# Patient Record
Sex: Male | Born: 1940 | Race: White | Hispanic: No | Marital: Married | Smoking: Never smoker
Health system: Southern US, Community
[De-identification: ages and names within clinical notes are randomized; demographics above are authoritative.]

## PROBLEM LIST (undated history)

## (undated) DIAGNOSIS — I10 Essential (primary) hypertension: Secondary | ICD-10-CM

## (undated) DIAGNOSIS — Z950 Presence of cardiac pacemaker: Secondary | ICD-10-CM

## (undated) DIAGNOSIS — Z7901 Long term (current) use of anticoagulants: Secondary | ICD-10-CM

## (undated) DIAGNOSIS — Z9289 Personal history of other medical treatment: Secondary | ICD-10-CM

## (undated) DIAGNOSIS — I495 Sick sinus syndrome: Secondary | ICD-10-CM

## (undated) DIAGNOSIS — D689 Coagulation defect, unspecified: Secondary | ICD-10-CM

## (undated) DIAGNOSIS — I499 Cardiac arrhythmia, unspecified: Secondary | ICD-10-CM

## (undated) DIAGNOSIS — E785 Hyperlipidemia, unspecified: Secondary | ICD-10-CM

## (undated) DIAGNOSIS — I480 Paroxysmal atrial fibrillation: Secondary | ICD-10-CM

## (undated) DIAGNOSIS — M1990 Unspecified osteoarthritis, unspecified site: Secondary | ICD-10-CM

## (undated) HISTORY — DX: Paroxysmal atrial fibrillation: I48.0

## (undated) HISTORY — PX: TONSILLECTOMY: SUR1361

## (undated) HISTORY — DX: Coagulation defect, unspecified: D68.9

## (undated) HISTORY — PX: COLONOSCOPY: SHX174

## (undated) HISTORY — DX: Sick sinus syndrome: I49.5

---

## 2000-09-19 ENCOUNTER — Other Ambulatory Visit: Admission: RE | Admit: 2000-09-19 | Discharge: 2000-09-19 | Payer: Self-pay | Admitting: Internal Medicine

## 2006-01-05 ENCOUNTER — Ambulatory Visit: Payer: Self-pay | Admitting: Internal Medicine

## 2006-01-19 ENCOUNTER — Ambulatory Visit: Payer: Self-pay | Admitting: Internal Medicine

## 2011-01-27 ENCOUNTER — Encounter: Payer: Self-pay | Admitting: Internal Medicine

## 2011-06-25 DIAGNOSIS — L570 Actinic keratosis: Secondary | ICD-10-CM | POA: Diagnosis not present

## 2011-06-25 DIAGNOSIS — D235 Other benign neoplasm of skin of trunk: Secondary | ICD-10-CM | POA: Diagnosis not present

## 2011-08-28 DIAGNOSIS — M1710 Unilateral primary osteoarthritis, unspecified knee: Secondary | ICD-10-CM | POA: Diagnosis not present

## 2011-08-28 DIAGNOSIS — M25469 Effusion, unspecified knee: Secondary | ICD-10-CM | POA: Diagnosis not present

## 2011-10-07 DIAGNOSIS — M1710 Unilateral primary osteoarthritis, unspecified knee: Secondary | ICD-10-CM | POA: Diagnosis not present

## 2011-10-07 DIAGNOSIS — IMO0002 Reserved for concepts with insufficient information to code with codable children: Secondary | ICD-10-CM | POA: Diagnosis not present

## 2011-10-18 DIAGNOSIS — M1710 Unilateral primary osteoarthritis, unspecified knee: Secondary | ICD-10-CM | POA: Diagnosis not present

## 2011-10-25 DIAGNOSIS — M1710 Unilateral primary osteoarthritis, unspecified knee: Secondary | ICD-10-CM | POA: Diagnosis not present

## 2011-11-01 DIAGNOSIS — M25469 Effusion, unspecified knee: Secondary | ICD-10-CM | POA: Diagnosis not present

## 2011-11-01 DIAGNOSIS — M1710 Unilateral primary osteoarthritis, unspecified knee: Secondary | ICD-10-CM | POA: Diagnosis not present

## 2011-11-09 DIAGNOSIS — M1710 Unilateral primary osteoarthritis, unspecified knee: Secondary | ICD-10-CM | POA: Diagnosis not present

## 2011-11-15 DIAGNOSIS — M25469 Effusion, unspecified knee: Secondary | ICD-10-CM | POA: Diagnosis not present

## 2011-11-15 DIAGNOSIS — M1710 Unilateral primary osteoarthritis, unspecified knee: Secondary | ICD-10-CM | POA: Diagnosis not present

## 2011-11-25 DIAGNOSIS — M722 Plantar fascial fibromatosis: Secondary | ICD-10-CM | POA: Diagnosis not present

## 2011-12-09 DIAGNOSIS — M722 Plantar fascial fibromatosis: Secondary | ICD-10-CM | POA: Diagnosis not present

## 2011-12-13 DIAGNOSIS — R29898 Other symptoms and signs involving the musculoskeletal system: Secondary | ICD-10-CM | POA: Diagnosis not present

## 2012-01-11 DIAGNOSIS — Z79899 Other long term (current) drug therapy: Secondary | ICD-10-CM | POA: Diagnosis not present

## 2012-01-11 DIAGNOSIS — E785 Hyperlipidemia, unspecified: Secondary | ICD-10-CM | POA: Diagnosis not present

## 2012-01-11 DIAGNOSIS — Z125 Encounter for screening for malignant neoplasm of prostate: Secondary | ICD-10-CM | POA: Diagnosis not present

## 2012-01-19 DIAGNOSIS — Z0000 Encounter for general adult medical examination without abnormal findings: Secondary | ICD-10-CM | POA: Diagnosis not present

## 2012-01-19 DIAGNOSIS — IMO0002 Reserved for concepts with insufficient information to code with codable children: Secondary | ICD-10-CM | POA: Diagnosis not present

## 2012-01-19 DIAGNOSIS — Z125 Encounter for screening for malignant neoplasm of prostate: Secondary | ICD-10-CM | POA: Diagnosis not present

## 2012-01-19 DIAGNOSIS — E785 Hyperlipidemia, unspecified: Secondary | ICD-10-CM | POA: Diagnosis not present

## 2012-01-19 DIAGNOSIS — Z79899 Other long term (current) drug therapy: Secondary | ICD-10-CM | POA: Diagnosis not present

## 2012-01-19 DIAGNOSIS — M1990 Unspecified osteoarthritis, unspecified site: Secondary | ICD-10-CM | POA: Diagnosis not present

## 2012-01-24 DIAGNOSIS — Z1212 Encounter for screening for malignant neoplasm of rectum: Secondary | ICD-10-CM | POA: Diagnosis not present

## 2012-01-24 DIAGNOSIS — M1710 Unilateral primary osteoarthritis, unspecified knee: Secondary | ICD-10-CM | POA: Diagnosis not present

## 2012-01-24 DIAGNOSIS — M25469 Effusion, unspecified knee: Secondary | ICD-10-CM | POA: Diagnosis not present

## 2012-01-24 DIAGNOSIS — M722 Plantar fascial fibromatosis: Secondary | ICD-10-CM | POA: Diagnosis not present

## 2012-02-16 ENCOUNTER — Encounter: Payer: Self-pay | Admitting: Internal Medicine

## 2012-03-02 DIAGNOSIS — M1710 Unilateral primary osteoarthritis, unspecified knee: Secondary | ICD-10-CM | POA: Diagnosis not present

## 2012-03-02 DIAGNOSIS — M25469 Effusion, unspecified knee: Secondary | ICD-10-CM | POA: Diagnosis not present

## 2012-03-14 ENCOUNTER — Other Ambulatory Visit: Payer: Self-pay | Admitting: Orthopedic Surgery

## 2012-03-23 ENCOUNTER — Other Ambulatory Visit (HOSPITAL_COMMUNITY): Payer: Self-pay

## 2012-03-27 DIAGNOSIS — M1710 Unilateral primary osteoarthritis, unspecified knee: Secondary | ICD-10-CM | POA: Diagnosis not present

## 2012-03-30 DIAGNOSIS — M1710 Unilateral primary osteoarthritis, unspecified knee: Secondary | ICD-10-CM | POA: Diagnosis not present

## 2012-03-31 DIAGNOSIS — M1990 Unspecified osteoarthritis, unspecified site: Secondary | ICD-10-CM | POA: Diagnosis not present

## 2012-03-31 DIAGNOSIS — R030 Elevated blood-pressure reading, without diagnosis of hypertension: Secondary | ICD-10-CM | POA: Diagnosis not present

## 2012-04-10 ENCOUNTER — Encounter (HOSPITAL_COMMUNITY)
Admission: RE | Admit: 2012-04-10 | Discharge: 2012-04-10 | Disposition: A | Discharge status: Home / Self Care | Payer: Medicare Other | Source: Ambulatory Visit | Attending: Orthopedic Surgery | Admitting: Orthopedic Surgery

## 2012-04-10 ENCOUNTER — Encounter (HOSPITAL_COMMUNITY): Payer: Self-pay

## 2012-04-10 ENCOUNTER — Ambulatory Visit (HOSPITAL_COMMUNITY)
Admission: RE | Admit: 2012-04-10 | Discharge: 2012-04-10 | Disposition: A | Discharge status: Home / Self Care | Payer: Medicare Other | Source: Ambulatory Visit | Attending: Orthopedic Surgery | Admitting: Orthopedic Surgery

## 2012-04-10 ENCOUNTER — Encounter (HOSPITAL_COMMUNITY): Payer: Self-pay | Admitting: Pharmacy Technician

## 2012-04-10 DIAGNOSIS — Z01811 Encounter for preprocedural respiratory examination: Secondary | ICD-10-CM | POA: Diagnosis not present

## 2012-04-10 HISTORY — DX: Personal history of other medical treatment: Z92.89

## 2012-04-10 HISTORY — DX: Essential (primary) hypertension: I10

## 2012-04-10 LAB — COMPREHENSIVE METABOLIC PANEL
BUN: 11 mg/dL (ref 6–23)
Calcium: 10 mg/dL (ref 8.4–10.5)
Creatinine, Ser: 0.95 mg/dL (ref 0.50–1.35)
GFR calc Af Amer: >90 mL/min (ref >90)
Glucose, Bld: 94 mg/dL (ref 70–99)
Total Protein: 7.3 g/dL (ref 6.0–8.3)

## 2012-04-10 LAB — CBC WITH DIFFERENTIAL/PLATELET
Basophils Relative: 1 % (ref 0–1)
Eosinophils Absolute: 0.2 10*3/uL (ref 0.0–0.7)
Eosinophils Relative: 2 % (ref 0–5)
Hemoglobin: 16.9 g/dL (ref 13.0–17.0)
Lymphs Abs: 2.5 10*3/uL (ref 0.7–4.0)
MCH: 33.5 pg (ref 26.0–34.0)
MCHC: 36.2 g/dL — ABNORMAL HIGH (ref 30.0–36.0)
MCV: 92.7 fL (ref 78.0–100.0)
Monocytes Relative: 8 % (ref 3–12)
RBC: 5.04 MIL/uL (ref 4.22–5.81)

## 2012-04-10 LAB — PROTIME-INR
INR: 1.05 (ref 0.00–1.49)
Prothrombin Time: 13.6 seconds (ref 11.6–15.2)

## 2012-04-10 LAB — URINALYSIS, ROUTINE W REFLEX MICROSCOPIC
Hgb urine dipstick: NEGATIVE
Leukocytes, UA: NEGATIVE
Nitrite: NEGATIVE
Protein, ur: NEGATIVE mg/dL
Urobilinogen, UA: 0.2 mg/dL (ref 0.0–1.0)

## 2012-04-10 LAB — SURGICAL PCR SCREEN: MRSA, PCR: NEGATIVE

## 2012-04-10 NOTE — Pre-Procedure Instructions (Addendum)
21 Richard [name]  04/10/2012   Your procedure is scheduled on:  04/19/2012  Report to [facility] at 10:00 AM.  Call this number if you have problems the morning of surgery: [phone]   Remember:   Do not eat food or drink liquid :After Midnight. On TUESDAY   Take these medicines the morning of surgery with A SIP OF WATER:  NOTHING   Do not wear jewelry  Do not wear lotions, powders, or perfumes. You may wear deodorant.   Men may shave face and neck.  Do not bring valuables to the hospital.  Contacts, dentures or bridgework may not be worn into surgery.  Leave suitcase in the car. After surgery it may be brought to your room.  For patients admitted to the hospital, checkout time is 11:00 AM the day of discharge.   Patients discharged the day of surgery will not be allowed to drive home.  Name and phone number of your driver: /w  spouse  Special Instructions: Shower using CHG 2 nights before surgery and the night before surgery.  If you shower the day of surgery use CHG.  Use special wash - you have one bottle of CHG for all showers.  You should use approximately 1/3 of the bottle for each shower.   Please read over the following fact sheets that you were given: Pain Booklet, Coughing and Deep Breathing, Blood Transfusion Information, Total Joint Packet, MRSA Information and Surgical Site Infection Prevention

## 2012-04-10 NOTE — Progress Notes (Signed)
Spoke with Synetta Fail at [facility] requested last ekg, cxr & OV note

## 2012-04-18 MED ORDER — POVIDONE-IODINE 7.5 % EX SOLN
Freq: Once | CUTANEOUS | Status: DC
  Filled 2012-04-18: qty 118

## 2012-04-18 MED ORDER — CEFAZOLIN SODIUM-DEXTROSE 2-3 GM-% IV SOLR
2.0000 g | INTRAVENOUS | Status: AC
  Administered 2012-04-19: 2 g via INTRAVENOUS
  Filled 2012-04-18: qty 50

## 2012-04-19 ENCOUNTER — Inpatient Hospital Stay (HOSPITAL_COMMUNITY): Payer: Medicare Other | Admitting: Certified Registered"

## 2012-04-19 ENCOUNTER — Inpatient Hospital Stay (HOSPITAL_COMMUNITY)
Admission: RE | Admit: 2012-04-19 | Discharge: 2012-04-23 | DRG: 470 | Disposition: A | Discharge status: Home Health | Payer: Medicare Other | Source: Ambulatory Visit | Attending: Orthopedic Surgery | Admitting: Orthopedic Surgery

## 2012-04-19 ENCOUNTER — Encounter (HOSPITAL_COMMUNITY): Payer: Self-pay | Admitting: *Deleted

## 2012-04-19 ENCOUNTER — Encounter (HOSPITAL_COMMUNITY): Payer: Self-pay | Admitting: Certified Registered"

## 2012-04-19 ENCOUNTER — Encounter (HOSPITAL_COMMUNITY)
Admission: RE | Disposition: A | Discharge status: Home Health | Payer: Self-pay | Source: Ambulatory Visit | Attending: Orthopedic Surgery

## 2012-04-19 DIAGNOSIS — I498 Other specified cardiac arrhythmias: Secondary | ICD-10-CM | POA: Diagnosis present

## 2012-04-19 DIAGNOSIS — Z79899 Other long term (current) drug therapy: Secondary | ICD-10-CM | POA: Diagnosis not present

## 2012-04-19 DIAGNOSIS — I4510 Unspecified right bundle-branch block: Secondary | ICD-10-CM | POA: Diagnosis present

## 2012-04-19 DIAGNOSIS — I10 Essential (primary) hypertension: Secondary | ICD-10-CM | POA: Diagnosis not present

## 2012-04-19 DIAGNOSIS — M1710 Unilateral primary osteoarthritis, unspecified knee: Secondary | ICD-10-CM | POA: Diagnosis not present

## 2012-04-19 DIAGNOSIS — Z7901 Long term (current) use of anticoagulants: Secondary | ICD-10-CM | POA: Diagnosis not present

## 2012-04-19 DIAGNOSIS — I480 Paroxysmal atrial fibrillation: Secondary | ICD-10-CM | POA: Diagnosis not present

## 2012-04-19 DIAGNOSIS — R001 Bradycardia, unspecified: Secondary | ICD-10-CM | POA: Diagnosis present

## 2012-04-19 DIAGNOSIS — I4891 Unspecified atrial fibrillation: Secondary | ICD-10-CM | POA: Diagnosis not present

## 2012-04-19 DIAGNOSIS — G8918 Other acute postprocedural pain: Secondary | ICD-10-CM | POA: Diagnosis not present

## 2012-04-19 DIAGNOSIS — M1711 Unilateral primary osteoarthritis, right knee: Secondary | ICD-10-CM | POA: Diagnosis present

## 2012-04-19 HISTORY — PX: KNEE ARTHROPLASTY: SHX992

## 2012-04-19 HISTORY — DX: Unspecified osteoarthritis, unspecified site: M19.90

## 2012-04-19 HISTORY — PX: TOTAL KNEE ARTHROPLASTY: SHX125

## 2012-04-19 LAB — TYPE AND SCREEN

## 2012-04-19 LAB — ABO/RH: ABO/RH(D): O POS

## 2012-04-19 SURGERY — ARTHROPLASTY, KNEE, TOTAL, USING IMAGELESS COMPUTER-ASSISTED NAVIGATION
Anesthesia: General | Site: Knee | Laterality: Right | Wound class: Clean

## 2012-04-19 MED ORDER — METHOCARBAMOL 500 MG PO TABS
500.0000 mg | ORAL_TABLET | Freq: Four times a day (QID) | ORAL | Status: DC | PRN
  Administered 2012-04-20 – 2012-04-23 (×5): 500 mg via ORAL
  Filled 2012-04-19 (×6): qty 1

## 2012-04-19 MED ORDER — SODIUM CHLORIDE 0.9 % IR SOLN
Status: DC | PRN
  Administered 2012-04-19: 3000 mL

## 2012-04-19 MED ORDER — LACTATED RINGERS IV SOLN
INTRAVENOUS | Status: DC
Start: 2012-04-19 — End: 2012-04-19
  Administered 2012-04-19 (×2): via INTRAVENOUS

## 2012-04-19 MED ORDER — FENTANYL CITRATE 0.05 MG/ML IJ SOLN
INTRAMUSCULAR | Status: AC
  Filled 2012-04-19: qty 2

## 2012-04-19 MED ORDER — CEFUROXIME SODIUM 1.5 G IJ SOLR
INTRAMUSCULAR | Status: AC
  Filled 2012-04-19: qty 1.5

## 2012-04-19 MED ORDER — FENTANYL CITRATE 0.05 MG/ML IJ SOLN
INTRAMUSCULAR | Status: DC | PRN
  Administered 2012-04-19 (×2): 50 ug via INTRAVENOUS
  Administered 2012-04-19: 150 ug via INTRAVENOUS

## 2012-04-19 MED ORDER — DIPHENHYDRAMINE HCL 12.5 MG/5ML PO ELIX
12.5000 mg | ORAL_SOLUTION | ORAL | Status: DC | PRN
  Filled 2012-04-19: qty 10

## 2012-04-19 MED ORDER — ONDANSETRON HCL 4 MG/2ML IJ SOLN
4.0000 mg | Freq: Four times a day (QID) | INTRAMUSCULAR | Status: DC | PRN
  Administered 2012-04-19: 4 mg via INTRAVENOUS
  Filled 2012-04-19: qty 2

## 2012-04-19 MED ORDER — HYDROMORPHONE HCL PF 1 MG/ML IJ SOLN
0.2500 mg | INTRAMUSCULAR | Status: DC | PRN
Start: 2012-04-19 — End: 2012-04-19
  Administered 2012-04-19 (×4): 0.5 mg via INTRAVENOUS

## 2012-04-19 MED ORDER — CEFAZOLIN SODIUM-DEXTROSE 2-3 GM-% IV SOLR
2.0000 g | Freq: Three times a day (TID) | INTRAVENOUS | Status: AC
  Administered 2012-04-19 – 2012-04-20 (×2): 2 g via INTRAVENOUS
  Filled 2012-04-19 (×2): qty 50

## 2012-04-19 MED ORDER — METOCLOPRAMIDE HCL 10 MG PO TABS
5.0000 mg | ORAL_TABLET | Freq: Three times a day (TID) | ORAL | Status: DC | PRN
  Filled 2012-04-19: qty 1

## 2012-04-19 MED ORDER — ONDANSETRON HCL 4 MG PO TABS: 4.0000 mg | ORAL_TABLET | Freq: Four times a day (QID) | ORAL | Status: DC | PRN

## 2012-04-19 MED ORDER — ADULT MULTIVITAMIN W/MINERALS CH
1.0000 | ORAL_TABLET | Freq: Every day | ORAL | Status: DC
  Administered 2012-04-19 – 2012-04-23 (×5): 1 via ORAL
  Filled 2012-04-19 (×5): qty 1

## 2012-04-19 MED ORDER — ALUM & MAG HYDROXIDE-SIMETH 200-200-20 MG/5ML PO SUSP: 30.0000 mL | ORAL | Status: DC | PRN

## 2012-04-19 MED ORDER — WARFARIN - PHARMACIST DOSING INPATIENT: Freq: Every day | Status: DC

## 2012-04-19 MED ORDER — WARFARIN SODIUM 6 MG PO TABS
6.0000 mg | ORAL_TABLET | Freq: Once | ORAL | Status: AC
  Administered 2012-04-19: 6 mg via ORAL
  Filled 2012-04-19: qty 1

## 2012-04-19 MED ORDER — COUMADIN BOOK
Freq: Once | Status: AC
  Administered 2012-04-19: 18:00:00
  Filled 2012-04-19: qty 1

## 2012-04-19 MED ORDER — LIDOCAINE HCL (CARDIAC) 20 MG/ML IV SOLN
INTRAVENOUS | Status: DC | PRN
  Administered 2012-04-19: 80 mg via INTRAVENOUS

## 2012-04-19 MED ORDER — METHOCARBAMOL 100 MG/ML IJ SOLN
500.0000 mg | INTRAVENOUS | Status: AC
  Administered 2012-04-19: 500 mg via INTRAVENOUS
  Filled 2012-04-19: qty 5

## 2012-04-19 MED ORDER — WARFARIN VIDEO: Freq: Once | Status: DC

## 2012-04-19 MED ORDER — ONDANSETRON HCL 4 MG/2ML IJ SOLN: 4.0000 mg | Freq: Once | INTRAMUSCULAR | Status: DC | PRN

## 2012-04-19 MED ORDER — HYDROMORPHONE HCL PF 1 MG/ML IJ SOLN
INTRAMUSCULAR | Status: AC
  Filled 2012-04-19: qty 2

## 2012-04-19 MED ORDER — BUPIVACAINE-EPINEPHRINE PF 0.5-1:200000 % IJ SOLN
INTRAMUSCULAR | Status: DC | PRN
  Administered 2012-04-19: 25 mL

## 2012-04-19 MED ORDER — GLYCOPYRROLATE 0.2 MG/ML IJ SOLN
INTRAMUSCULAR | Status: DC | PRN
  Administered 2012-04-19 (×3): 0.2 mg via INTRAVENOUS
  Administered 2012-04-19: 0.4 mg via INTRAVENOUS
  Administered 2012-04-19: 0.1 mg via INTRAVENOUS

## 2012-04-19 MED ORDER — ROCURONIUM BROMIDE 100 MG/10ML IV SOLN
INTRAVENOUS | Status: DC | PRN
  Administered 2012-04-19: 50 mg via INTRAVENOUS

## 2012-04-19 MED ORDER — NEOSTIGMINE METHYLSULFATE 1 MG/ML IJ SOLN
INTRAMUSCULAR | Status: DC | PRN
  Administered 2012-04-19: 3 mg via INTRAVENOUS

## 2012-04-19 MED ORDER — FENTANYL CITRATE 0.05 MG/ML IJ SOLN
100.0000 ug | Freq: Once | INTRAMUSCULAR | Status: AC
  Administered 2012-04-19: 100 ug via INTRAVENOUS

## 2012-04-19 MED ORDER — ZOLPIDEM TARTRATE 5 MG PO TABS: 5.0000 mg | ORAL_TABLET | Freq: Every evening | ORAL | Status: DC | PRN

## 2012-04-19 MED ORDER — PROPOFOL 10 MG/ML IV BOLUS
INTRAVENOUS | Status: DC | PRN
  Administered 2012-04-19: 200 mg via INTRAVENOUS

## 2012-04-19 MED ORDER — SIMVASTATIN 10 MG PO TABS
10.0000 mg | ORAL_TABLET | Freq: Every day | ORAL | Status: DC
  Administered 2012-04-19 – 2012-04-22 (×4): 10 mg via ORAL
  Filled 2012-04-19 (×6): qty 1

## 2012-04-19 MED ORDER — SODIUM CHLORIDE 0.9 % IV SOLN: INTRAVENOUS | Status: DC

## 2012-04-19 MED ORDER — ONDANSETRON HCL 4 MG/2ML IJ SOLN
INTRAMUSCULAR | Status: DC | PRN
  Administered 2012-04-19: 4 mg via INTRAVENOUS

## 2012-04-19 MED ORDER — OXYCODONE HCL 5 MG PO TABS
5.0000 mg | ORAL_TABLET | ORAL | Status: DC | PRN
  Administered 2012-04-19 – 2012-04-20 (×5): 10 mg via ORAL
  Administered 2012-04-21: 5 mg via ORAL
  Administered 2012-04-21 – 2012-04-23 (×6): 10 mg via ORAL
  Filled 2012-04-19 (×4): qty 2
  Filled 2012-04-19: qty 1
  Filled 2012-04-19 (×8): qty 2

## 2012-04-19 MED ORDER — POLYETHYLENE GLYCOL 3350 17 G PO PACK
17.0000 g | PACK | Freq: Every day | ORAL | Status: DC | PRN
  Filled 2012-04-19: qty 1

## 2012-04-19 MED ORDER — METOCLOPRAMIDE HCL 5 MG/ML IJ SOLN
5.0000 mg | Freq: Three times a day (TID) | INTRAMUSCULAR | Status: DC | PRN
  Filled 2012-04-19: qty 2

## 2012-04-19 MED ORDER — DOCUSATE SODIUM 100 MG PO CAPS
100.0000 mg | ORAL_CAPSULE | Freq: Two times a day (BID) | ORAL | Status: DC
  Administered 2012-04-19 – 2012-04-23 (×8): 100 mg via ORAL
  Filled 2012-04-19 (×9): qty 1

## 2012-04-19 MED ORDER — HYDROMORPHONE HCL PF 1 MG/ML IJ SOLN: 1.0000 mg | INTRAMUSCULAR | Status: DC | PRN

## 2012-04-19 MED ORDER — METHOCARBAMOL 100 MG/ML IJ SOLN
500.0000 mg | Freq: Four times a day (QID) | INTRAVENOUS | Status: DC | PRN
  Filled 2012-04-19: qty 5

## 2012-04-19 MED ORDER — CEFUROXIME SODIUM 1.5 G IJ SOLR
INTRAMUSCULAR | Status: DC | PRN
  Administered 2012-04-19: 1.5 g

## 2012-04-19 MED ORDER — ACETAMINOPHEN 10 MG/ML IV SOLN
1000.0000 mg | Freq: Four times a day (QID) | INTRAVENOUS | Status: AC
  Administered 2012-04-19 – 2012-04-20 (×3): 1000 mg via INTRAVENOUS
  Filled 2012-04-19 (×5): qty 100

## 2012-04-19 SURGICAL SUPPLY — 64 items
BANDAGE ESMARK 6X9 LF (GAUZE/BANDAGES/DRESSINGS) ×1 IMPLANT
BENZOIN TINCTURE PRP APPL 2/3 (GAUZE/BANDAGES/DRESSINGS) ×2 IMPLANT
BLADE SAGITTAL 25.0X1.19X90 (BLADE) ×2 IMPLANT
BLADE SAW SAG 90X13X1.27 (BLADE) ×2 IMPLANT
BNDG ESMARK 6X9 LF (GAUZE/BANDAGES/DRESSINGS) ×2
BOWL SMART MIX CTS (DISPOSABLE) ×2 IMPLANT
CEMENT HV SMART SET (Cement) ×2 IMPLANT
CLOTH BEACON ORANGE TIMEOUT ST (SAFETY) ×2 IMPLANT
CLSR STERI-STRIP ANTIMIC 1/2X4 (GAUZE/BANDAGES/DRESSINGS) ×2 IMPLANT
COVER BACK TABLE 24X17X13 BIG (DRAPES) IMPLANT
COVER SURGICAL LIGHT HANDLE (MISCELLANEOUS) ×2 IMPLANT
CUFF TOURNIQUET SINGLE 34IN LL (TOURNIQUET CUFF) ×2 IMPLANT
CUFF TOURNIQUET SINGLE 44IN (TOURNIQUET CUFF) IMPLANT
DRAPE EXTREMITY T 121X128X90 (DRAPE) ×2 IMPLANT
DRAPE U-SHAPE 47X51 STRL (DRAPES) ×2 IMPLANT
DRSG PAD ABDOMINAL 8X10 ST (GAUZE/BANDAGES/DRESSINGS) ×2 IMPLANT
DURAPREP 26ML APPLICATOR (WOUND CARE) ×2 IMPLANT
ELECT REM PT RETURN 9FT ADLT (ELECTROSURGICAL) ×2
ELECTRODE REM PT RTRN 9FT ADLT (ELECTROSURGICAL) ×1 IMPLANT
EVACUATOR 1/8 PVC DRAIN (DRAIN) ×2 IMPLANT
FACESHIELD LNG OPTICON STERILE (SAFETY) ×2 IMPLANT
GAUZE XEROFORM 5X9 LF (GAUZE/BANDAGES/DRESSINGS) IMPLANT
GLOVE BIOGEL PI IND STRL 8 (GLOVE) ×2 IMPLANT
GLOVE BIOGEL PI INDICATOR 8 (GLOVE) ×2
GLOVE ECLIPSE 7.5 STRL STRAW (GLOVE) ×4 IMPLANT
GOWN PREVENTION PLUS XLARGE (GOWN DISPOSABLE) ×8 IMPLANT
GOWN SRG XL XLNG 56XLVL 4 (GOWN DISPOSABLE) ×1 IMPLANT
GOWN STRL NON-REIN LRG LVL3 (GOWN DISPOSABLE) IMPLANT
GOWN STRL NON-REIN XL XLG LVL4 (GOWN DISPOSABLE) ×1
HANDPIECE INTERPULSE COAX TIP (DISPOSABLE) ×1
HOOD PEEL AWAY FACE SHEILD DIS (HOOD) ×6 IMPLANT
IMMOBILIZER KNEE 20 (SOFTGOODS)
IMMOBILIZER KNEE 20 THIGH 36 (SOFTGOODS) IMPLANT
IMMOBILIZER KNEE 22 UNIV (SOFTGOODS) ×2 IMPLANT
IMMOBILIZER KNEE 24 THIGH 36 (MISCELLANEOUS) IMPLANT
IMMOBILIZER KNEE 24 UNIV (MISCELLANEOUS)
KIT BASIN OR (CUSTOM PROCEDURE TRAY) ×2 IMPLANT
KIT ROOM TURNOVER OR (KITS) ×2 IMPLANT
MANIFOLD NEPTUNE II (INSTRUMENTS) ×2 IMPLANT
MARKER SPHERE PSV REFLC THRD 5 (MARKER) ×6 IMPLANT
NEEDLE HYPO 25GX1X1/2 BEV (NEEDLE) IMPLANT
NS IRRIG 1000ML POUR BTL (IV SOLUTION) ×2 IMPLANT
PACK TOTAL JOINT (CUSTOM PROCEDURE TRAY) ×2 IMPLANT
PAD ARMBOARD 7.5X6 YLW CONV (MISCELLANEOUS) ×4 IMPLANT
PAD CAST 4YDX4 CTTN HI CHSV (CAST SUPPLIES) ×1 IMPLANT
PADDING CAST COTTON 4X4 STRL (CAST SUPPLIES) ×1
PADDING CAST COTTON 6X4 STRL (CAST SUPPLIES) ×2 IMPLANT
PIN SCHANZ 4MM 130MM (PIN) ×8 IMPLANT
SET HNDPC FAN SPRY TIP SCT (DISPOSABLE) ×1 IMPLANT
SPONGE GAUZE 4X4 12PLY (GAUZE/BANDAGES/DRESSINGS) ×2 IMPLANT
STAPLER VISISTAT 35W (STAPLE) IMPLANT
STRIP CLOSURE SKIN 1/2X4 (GAUZE/BANDAGES/DRESSINGS) ×2 IMPLANT
SUCTION FRAZIER TIP 10 FR DISP (SUCTIONS) ×2 IMPLANT
SUT MON AB 3-0 SH 27 (SUTURE)
SUT MON AB 3-0 SH27 (SUTURE) IMPLANT
SUT VIC AB 0 CTB1 27 (SUTURE) ×4 IMPLANT
SUT VIC AB 1 CT1 27 (SUTURE) ×2
SUT VIC AB 1 CT1 27XBRD ANBCTR (SUTURE) ×2 IMPLANT
SUT VIC AB 2-0 CTB1 (SUTURE) ×4 IMPLANT
SYR CONTROL 10ML LL (SYRINGE) IMPLANT
TOWEL OR 17X24 6PK STRL BLUE (TOWEL DISPOSABLE) ×2 IMPLANT
TOWEL OR 17X26 10 PK STRL BLUE (TOWEL DISPOSABLE) ×2 IMPLANT
TRAY FOLEY CATH 14FR (SET/KITS/TRAYS/PACK) ×2 IMPLANT
WATER STERILE IRR 1000ML POUR (IV SOLUTION) ×6 IMPLANT

## 2012-04-19 NOTE — Transfer of Care (Signed)
Immediate Anesthesia Transfer of Care Note  Patient: Richard [name]  Procedure(s) Performed: Procedure(s) (LRB) with comments: COMPUTER ASSISTED TOTAL KNEE ARTHROPLASTY (Right) - GENERAL WITH PRE OP FEMORAL NERVE BLOCK  Patient Location: PACU  Anesthesia Type:General  Level of Consciousness: awake and alert   Airway & Oxygen Therapy: Patient Spontanous Breathing and Patient connected to nasal cannula oxygen  Post-op Assessment: Report given to PACU RN  Post vital signs: Reviewed  Complications: No apparent anesthesia complications

## 2012-04-19 NOTE — Plan of Care (Signed)
Problem: Consults Goal: Diagnosis- Total Joint Replacement Primary Total Knee     

## 2012-04-19 NOTE — Progress Notes (Signed)
ANTICOAGULATION CONSULT NOTE - Initial Consult  Pharmacy Consult for Coumadin Indication: VTE prophylaxis x1 month s/p right-TKA  No Known Allergies Patient Measurements: Weight: 86.2 kg on 04/10/12 Height: 178 cm on 04/10/12 Vital Signs: Temp: 97 F (36.1 C) (12/04 1615) Temp src: Oral (12/04 1018) BP: 160/68 mmHg (12/04 1637) Pulse Rate: 48  (12/04 1637) Labs: Baseline labs from 04/10/12: INR 1.05, H/H 16.9/46.7, Plts 216, SCr 0.95, Albumin 4, AST/ALT 26/31, Alk Phos 74  Medical History: Past Medical History  Diagnosis Date  . H/O cardiovascular stress test     normal, done as a baseline , ? where it was done, 4-5 yrs. ago  . Hypertension    Medications:  Scheduled:    . acetaminophen  1,000 mg Intravenous Q6H  . [COMPLETED]  ceFAZolin (ANCEF) IV  2 g Intravenous 60 min Pre-Op  .  ceFAZolin (ANCEF) IV  2 g Intravenous Q8H  . docusate sodium  100 mg Oral BID  . [COMPLETED] fentaNYL  100 mcg Intravenous Once  . HYDROmorphone      . [COMPLETED] methocarbamol (ROBAXIN) IV  500 mg Intravenous To PACU  . multivitamin with minerals  1 tablet Oral Daily  . simvastatin  10 mg Oral q1800  . [DISCONTINUED] povidone-iodine   Topical Once   Assessment: 71 YOM s/p right total knee arthroplasty to start Coumadin for VTE prophylaxis. Patient was not on anticoagulation prior to admission. Baseline labs (INR, LFTs, CBC) were all within normal limits on 04/10/12. No overt bleeding reported. Warfarin score is 5-will start lower secondary to lower INR goal requested per MD. Patient is not on any significant potentiators of Coumadin.   Goal of Therapy:  INR 1.5-2 per MD *Lower Goal*   Plan:  1. Warfarin 6mg  po x1 at 1800. 2. Follow-up daily PT/INR. 3. Coumadin book and video to initiate education.   Link Snuffer, PharmD, BCPS Clinical Pharmacist [phone] 04/19/2012,4:49 PM

## 2012-04-19 NOTE — Anesthesia Preprocedure Evaluation (Addendum)
Anesthesia Evaluation  Patient identified by MRN, date of birth, ID band Patient awake    Reviewed: Allergy & Precautions, H&P , NPO status , Patient's Chart, lab work & pertinent test results  Airway Mallampati: I TM Distance: >3 FB Neck ROM: Full    Dental  (+) Teeth Intact   Pulmonary          Cardiovascular hypertension, Rhythm:regular Rate:Normal     Neuro/Psych    GI/Hepatic   Endo/Other    Renal/GU      Musculoskeletal   Abdominal   Peds  Hematology   Anesthesia Other Findings   Reproductive/Obstetrics                          Anesthesia Physical Anesthesia Plan  ASA: II  Anesthesia Plan: General   Post-op Pain Management:    Induction: Intravenous  Airway Management Planned: LMA  Additional Equipment:   Intra-op Plan:   Post-operative Plan: Extubation in OR  Informed Consent: I have reviewed the patients History and Physical, chart, labs and discussed the procedure including the risks, benefits and alternatives for the proposed anesthesia with the patient or authorized representative who has indicated his/her understanding and acceptance.   Dental advisory given  Plan Discussed with: Anesthesiologist and Surgeon  Anesthesia Plan Comments:         Anesthesia Quick Evaluation

## 2012-04-19 NOTE — Anesthesia Procedure Notes (Addendum)
Anesthesia Regional Block:  Femoral nerve block  Pre-Anesthetic Checklist: ,, timeout performed, Correct Patient, Correct Site, Correct Laterality, Correct Procedure, Correct Position, site marked, Risks and benefits discussed,  Surgical consent,  Pre-op evaluation,  At surgeon's request and post-op pain management  Laterality: Right     Needles:  Injection technique: Single-shot  Needle Type: Stimulator Needle - 80        Needle insertion depth: 6 cm   Additional Needles:  Procedures: nerve stimulator Femoral nerve block  Nerve Stimulator or Paresthesia:  Response: 0.5 mA, 0.1 ms, 6 cm  Additional Responses:   Narrative:  Start time: 04/19/2012 12:00 PM End time: 04/19/2012 12:05 PM Injection made incrementally with aspirations every 5 mL.  Performed by: Personally  Anesthesiologist: Maren Beach MD  Additional Notes: Pt accepts procedure and risks. 25cc 0.5% Marcaine w/ epi w/o difficulty or discomfort. Pt tolerated well. Maren Beach MD   Procedure Name: LMA Insertion Date/Time: 04/19/2012 12:31 PM Performed by: Sheppard Evens Pre-anesthesia Checklist: Patient identified, Emergency Drugs available, Suction available, Patient being monitored and Timeout performed Patient Re-evaluated:Patient Re-evaluated prior to inductionOxygen Delivery Method: Circle system utilized Preoxygenation: Pre-oxygenation with 100% oxygen Intubation Type: IV induction LMA: LMA inserted LMA Size: 4.0 Number of attempts: 1 Placement Confirmation: positive ETCO2 and breath sounds checked- equal and bilateral Tube secured with: Tape Dental Injury: Teeth and Oropharynx as per pre-operative assessment     Procedure Name: Intubation Date/Time: 04/19/2012 12:41 PM Performed by: Arlice Colt B Pre-anesthesia Checklist: Patient identified, Emergency Drugs available, Suction available, Patient being monitored and Timeout performed Laryngoscope Size: Mac and 4 Grade View: Grade  I Tube type: Oral Tube size: 7.5 mm Number of attempts: 1 Airway Equipment and Method: Stylet Placement Confirmation: ETT inserted through vocal cords under direct vision,  positive ETCO2 and breath sounds checked- equal and bilateral Secured at: 23 cm Tube secured with: Tape Dental Injury: Teeth and Oropharynx as per pre-operative assessment

## 2012-04-19 NOTE — H&P (Signed)
TOTAL KNEE ADMISSION H&P  Patient is being admitted for right total knee arthroplasty.  Subjective:  Chief Complaint:right knee pain.  HPI: Richard [name], 71 y.o. male, has a history of pain and functional disability in the right knee due to arthritis and has failed non-surgical conservative treatments for greater than 12 weeks to includeNSAID's and/or analgesics, corticosteriod injections, viscosupplementation injections and activity modification.  Onset of symptoms was gradual, starting 5 years ago with gradually worsening course since that time. The patient noted prior procedures on the knee to include  arthroscopy on the right knee(s).  Patient currently rates pain in the right knee(s) at 7 out of 10 with activity. Patient has night pain, worsening of pain with activity and weight bearing, pain that interferes with activities of daily living and joint swelling.  Patient has evidence of subchondral sclerosis, joint subluxation and joint space narrowing by imaging studies.  There is no active infection.  There are no active problems to display for this patient.  Past Medical History  Diagnosis Date  . H/O cardiovascular stress test     normal, done as a baseline , ? where it was done, 4-5 yrs. ago  . Hypertension     Past Surgical History  Procedure Date  . Colonoscopy   . Tonsillectomy     Prescriptions prior to admission  Medication Sig Dispense Refill  . Multiple Vitamin (MULTIVITAMIN WITH MINERALS) TABS Take 1 tablet by mouth daily.      . naproxen sodium (ANAPROX) 220 MG tablet Take 440 mg by mouth daily as needed. pain      . simvastatin (ZOCOR) 10 MG tablet Take 10 mg by mouth daily.       No Known Allergies  History  Substance Use Topics  . Smoking status: Never Smoker   . Smokeless tobacco: Not on file  . Alcohol Use: Yes     Comment: week- 1-2 drinks, 1-2 beers     History reviewed. No pertinent family history.   ROS  Objective:  Physical Exam  Vital  signs in last 24 hours: Temp:  [97.5 F (36.4 C)] 97.5 F (36.4 C) (12/04 1018) Pulse Rate:  [50-63] 63  (12/04 1155) Resp:  [18] 18  (12/04 1155) BP: (166-196)/(78-93) 166/78 mmHg (12/04 1103) SpO2:  [95 %-100 %] 100 % (12/04 1155)  Labs:   There is no height or weight on file to calculate BMI.   Imaging Review Plain radiographs demonstrate severe degenerative joint disease of the right knee(s). The overall alignment ismild varus. The bone quality appears to be good for age and reported activity level.  Assessment/Plan:  End stage arthritis, right knee   The patient history, physical examination, clinical judgment of the provider and imaging studies are consistent with end stage degenerative joint disease of the right knee(s) and total knee arthroplasty is deemed medically necessary. The treatment options including medical management, injection therapy arthroscopy and arthroplasty were discussed at length. The risks and benefits of total knee arthroplasty were presented and reviewed. The risks due to aseptic loosening, infection, stiffness, patella tracking problems, thromboembolic complications and other imponderables were discussed. The patient acknowledged the explanation, agreed to proceed with the plan and consent was signed. Patient is being admitted for inpatient treatment for surgery, pain control, PT, OT, prophylactic antibiotics, VTE prophylaxis, progressive ambulation and ADL's and discharge planning. The patient is planning to be discharged to skilled nursing facility

## 2012-04-19 NOTE — Consult Note (Signed)
Reason for Consult: Bradycardia Referring Physician:   PCP: Lorain Childes Cardiologist: New  Richard [name] is an 71 y.o. male.  HPI:    The patient is a 71 yo male with a history of HLD, HTN(untreated) and osteoarthritis.  He is S/P right TKR today and having bradycardia into the low 40's.  His last NST was ~4-5years ago and apparently normal.  He swims 2500-3000 yards three times per week.  His father died at age 89 from a massive MI.  Mom died at age 37.  He currently denies N, V, CP, SOB, ABD pain, palpitations, dizziness.    Past Medical History  Diagnosis Date  . H/O cardiovascular stress test     normal, done as a baseline , ? where it was done, 4-5 yrs. ago  . Hypertension     Past Surgical History  Procedure Date  . Colonoscopy   . Tonsillectomy     History reviewed. No pertinent family history.  Social History:  reports that he has never smoked. He does not have any smokeless tobacco history on file. He reports that he drinks alcohol. He reports that he does not use illicit drugs.  Allergies: No Known Allergies  Medications, Home: Statin   Results for orders placed during the hospital encounter of 04/19/12 (from the past 48 hour(s))  TYPE AND SCREEN     Status: Normal   Collection Time   04/19/12 10:20 AM      Component Value Range Comment   ABO/RH(D) O POS      Antibody Screen NEG      Sample Expiration 04/22/2012     ABO/RH     Status: Normal   Collection Time   04/19/12 10:20 AM      Component Value Range Comment   ABO/RH(D) O POS       No results found.  Review of Systems  Constitutional: Negative for fever.  Respiratory: Negative for shortness of breath.   Cardiovascular: Negative for chest pain, palpitations, orthopnea and leg swelling.  Gastrointestinal: Negative for nausea, vomiting and abdominal pain.  Neurological: Negative for dizziness.   Blood pressure 150/54, pulse 49, temperature 97.1 F (36.2 C), temperature source Oral, resp. rate  13, SpO2 96.00%. Physical Exam  Constitutional: He is oriented to person, place, and time. He appears well-developed and well-nourished. No distress.  HENT:  Head: Normocephalic and atraumatic.  Eyes: EOM are normal. Pupils are equal, round, and reactive to light. No scleral icterus.  Neck: Normal range of motion. Neck supple.  Cardiovascular: Regular rhythm, S1 normal and S2 normal.  Bradycardia present.   No murmur heard. Pulses:      Radial pulses are 2+ on the right side, and 2+ on the left side.       Dorsalis pedis pulses are 2+ on the right side, and 1+ on the left side.       No Carotid Bruit  Respiratory: Effort normal and breath sounds normal. He has no wheezes. He has no rales.  GI: Soft. Bowel sounds are normal. There is no tenderness.  Musculoskeletal: He exhibits no edema.  Lymphadenopathy:    He has no cervical adenopathy.  Neurological: He is alert and oriented to person, place, and time.  Skin: Skin is warm and dry. No erythema.  Psychiatric: He has a normal mood and affect.    Assessment/Plan: Patient Active Hospital Problem List: Osteoarthritis of right knee (04/19/2012) Bradycardia (04/19/2012) HTN (hypertension) (04/19/2012)  Plan:  Will check EKG.  Continue to  monitor on tele. Avoid beta blockers.  May be related to anesthesia.  No prior issues with bradycardia.  Asymptomatic.  If he continues to have a low HR, may require a 30 day monitor.     Richard [name] 04/19/2012, 3:43 PM

## 2012-04-19 NOTE — Progress Notes (Signed)
Orthopedic Tech Progress Note Patient Details:  Richard [name] Sep 23, 1940 [id number]  CPM Right Knee CPM Right Knee: On Right Knee Flexion (Degrees): 60  Right Knee Extension (Degrees): 0  Additional Comments: applied overhead frame   Jennye Moccasin 04/19/2012, 3:48 PM

## 2012-04-19 NOTE — Consult Note (Signed)
I have seen and evaluated the patient this morning along with Wilburt Finlay, PA. I agree with his findings, examination as well as impression recommendations.   Very pleasant, otherwise healthy and active 70 y/o man with "borderline HTN" by his report, but hypertensive post-op s/o R Knee Arthroplasty.  We were consulted for Bradycardia with rates in 40s.  He is asymptomatic from a cardiac standpoint.  BP is in 160-180 range -- likely related to pain.    Pre-op ECG had a rate of 41.  He is relatively active - which may indicate baseline conditioning leading to bradycardia. At present, he is not ambulatory in order to determine if his HR increases with exercise demonstrating chronotropic competence.  He does not complain of fatigue, dizziness or near syncope pre-operatively.  Would monitor on Tele at least overnight, but anticipate that he will remain relatively stable.  No clear indication for PPM at this time.   Agree with avoiding BB or dihydropyridine / AV nodal active CCB (Verapamil or Diltiazem).   Will arrange OP monitor to determine HR trends.    We will follow on telemetry & set up OP monitor.   Thank you for the consultation.

## 2012-04-20 ENCOUNTER — Encounter (HOSPITAL_COMMUNITY): Payer: Self-pay | Admitting: General Practice

## 2012-04-20 DIAGNOSIS — I4510 Unspecified right bundle-branch block: Secondary | ICD-10-CM | POA: Diagnosis present

## 2012-04-20 DIAGNOSIS — I480 Paroxysmal atrial fibrillation: Secondary | ICD-10-CM | POA: Diagnosis not present

## 2012-04-20 LAB — BASIC METABOLIC PANEL
BUN: 12 mg/dL (ref 6–23)
Calcium: 8.4 mg/dL (ref 8.4–10.5)
GFR calc Af Amer: >90 mL/min (ref >90)
GFR calc non Af Amer: 81 mL/min — ABNORMAL LOW (ref >90)
Glucose, Bld: 116 mg/dL — ABNORMAL HIGH (ref 70–99)
Potassium: 4.2 mEq/L (ref 3.5–5.1)
Sodium: 137 mEq/L (ref 135–145)

## 2012-04-20 LAB — CBC
HCT: 37.2 % — ABNORMAL LOW (ref 39.0–52.0)
Hemoglobin: 13 g/dL (ref 13.0–17.0)
MCH: 32.4 pg (ref 26.0–34.0)
MCHC: 34.9 g/dL (ref 30.0–36.0)
MCV: 92.8 fL (ref 78.0–100.0)
Platelets: 190 10*3/uL (ref 150–400)
RBC: 4.01 MIL/uL — ABNORMAL LOW (ref 4.22–5.81)
RDW: 12.2 % (ref 11.5–15.5)
WBC: 10.8 10*3/uL — ABNORMAL HIGH (ref 4.0–10.5)

## 2012-04-20 LAB — PROTIME-INR
INR: 1.31 (ref 0.00–1.49)
Prothrombin Time: 16 seconds — ABNORMAL HIGH (ref 11.6–15.2)

## 2012-04-20 MED ORDER — DILTIAZEM HCL 100 MG IV SOLR
5.0000 mg/h | INTRAVENOUS | Status: DC
  Administered 2012-04-20: 5 mg/h via INTRAVENOUS
  Filled 2012-04-20: qty 100

## 2012-04-20 MED ORDER — METOPROLOL TARTRATE 12.5 MG HALF TABLET
12.5000 mg | ORAL_TABLET | Freq: Two times a day (BID) | ORAL | Status: DC
  Administered 2012-04-20 – 2012-04-22 (×4): 12.5 mg via ORAL
  Filled 2012-04-20 (×7): qty 1

## 2012-04-20 MED ORDER — DILTIAZEM LOAD VIA INFUSION
20.0000 mg | Freq: Once | INTRAVENOUS | Status: AC
  Administered 2012-04-20: 20 mg via INTRAVENOUS
  Filled 2012-04-20: qty 20

## 2012-04-20 MED ORDER — WARFARIN SODIUM 6 MG PO TABS
6.0000 mg | ORAL_TABLET | Freq: Once | ORAL | Status: AC
  Administered 2012-04-20: 6 mg via ORAL
  Filled 2012-04-20: qty 1

## 2012-04-20 NOTE — Progress Notes (Signed)
Called to patient's bedside because HR increased to 130s, irregular. Patient asymptomatic, but notes "palpitations".  HR 120-140s AF, BP 160/70  RR 20 O2 sat 96%. Lung sounds clear, heart tones irregular.  Patient asymptomatic. 12 lead EKG done [place] PA at bedside to assess pt. Plan PO lopressor RN to call if assistance needed.

## 2012-04-20 NOTE — Progress Notes (Signed)
Subjective: 1 Day Post-Op Procedure(s) (LRB): COMPUTER ASSISTED TOTAL KNEE ARTHROPLASTY (Right) Patient reports pain as 6 on 0-10 scale.    Objective: Vital signs in last 24 hours: Temp:  [97 F (36.1 C)-99.4 F (37.4 C)] 98.6 F (37 C) (12/05 0522) Pulse Rate:  [43-93] 56  (12/05 0522) Resp:  [10-18] 18  (12/05 0522) BP: (137-196)/(48-93) 137/54 mmHg (12/05 0522) SpO2:  [95 %-100 %] 99 % (12/05 0522)  Intake/Output from previous day: 12/04 0701 - 12/05 0700 In: 2150 [P.O.:240; I.V.:1910] Out: 1150 [Urine:650; Drains:500] Intake/Output this shift:     Basename 04/20/12 0630  HGB 13.0    Basename 04/20/12 0630  WBC 10.8*  RBC 4.01*  HCT 37.2*  PLT 190    Basename 04/20/12 0630  NA 137  K 4.2  CL 101  CO2 27  BUN 12  CREATININE 0.99  GLUCOSE 116*  CALCIUM 8.4    Basename 04/20/12 0630  LABPT --  INR 1.31    Neurologically intact ABD soft Neurovascular intact Sensation intact distally No cellulitis present Compartment soft  Assessment/Plan: 1 Day Post-Op Procedure(s) (LRB): COMPUTER ASSISTED TOTAL KNEE ARTHROPLASTY (Right) Advance diet Up with therapy Pt wanting to go to [facility] --- will monitor progress through today  Sharlynn Seckinger L 04/20/2012, 7:50 AM

## 2012-04-20 NOTE — Progress Notes (Signed)
[facility]  Subjective: No complaints other than his knee  Objective: Vital signs in last 24 hours: Temp:  [97 F (36.1 C)-99.4 F (37.4 C)] 98.6 F (37 C) (12/05 0522) Pulse Rate:  [43-93] 56  (12/05 0522) Resp:  [10-18] 18  (12/05 0522) BP: (137-196)/(48-93) 137/54 mmHg (12/05 0522) SpO2:  [95 %-100 %] 99 % (12/05 0522) Last BM Date: 04/19/12  Intake/Output from previous day: 12/04 0701 - 12/05 0700 In: 2390 [P.O.:480; I.V.:1910] Out: 1850 [Urine:1150; Drains:700] Intake/Output this shift:    Medications Current Facility-Administered Medications  Medication Dose Route Frequency Provider Last Rate Last Dose  . 0.9 %  sodium chloride infusion   Intravenous Continuous Matthew Folks, PA      . acetaminophen (OFIRMEV) IV 1,000 mg  1,000 mg Intravenous Q6H Matthew Folks, PA   1,000 mg at 04/20/12 0005  . alum & mag hydroxide-simeth (MAALOX/MYLANTA) 200-200-20 MG/5ML suspension 30 mL  30 mL Oral Q4H PRN Matthew Folks, PA      . [COMPLETED] ceFAZolin (ANCEF) IVPB 2 g/50 mL premix  2 g Intravenous 60 min Pre-Op Harvie Junior, MD   2 g at 04/19/12 1235  . [COMPLETED] ceFAZolin (ANCEF) IVPB 2 g/50 mL premix  2 g Intravenous Q8H Matthew Folks, PA   2 g at 04/20/12 0206  . [COMPLETED] coumadin book   Does not apply Once [facility], PHARMD      . diphenhydrAMINE (BENADRYL) 12.5 MG/5ML elixir 12.5-25 mg  12.5-25 mg Oral Q4H PRN Matthew Folks, PA      . docusate sodium (COLACE) capsule 100 mg  100 mg Oral BID Matthew Folks, PA   100 mg at 04/19/12 2145  . [COMPLETED] fentaNYL (SUBLIMAZE) injection 100 mcg  100 mcg Intravenous Once Rivka Barbara, MD   100 mcg at 04/19/12 1206  . [EXPIRED] HYDROmorphone (DILAUDID) 1 MG/ML injection           . HYDROmorphone (DILAUDID) injection 1-2 mg  1-2 mg Intravenous Q3H PRN Matthew Folks, PA      . methocarbamol (ROBAXIN) tablet 500 mg  500 mg Oral Q6H PRN Matthew Folks, PA   500 mg at  04/20/12 1610   Or  . methocarbamol (ROBAXIN) 500 mg in dextrose 5 % 50 mL IVPB  500 mg Intravenous Q6H PRN Matthew Folks, PA      . [COMPLETED] methocarbamol (ROBAXIN) 500 mg in dextrose 5 % 50 mL IVPB  500 mg Intravenous To PACU Harvie Junior, MD   500 mg at 04/19/12 1522  . metoCLOPramide (REGLAN) tablet 5-10 mg  5-10 mg Oral Q8H PRN Matthew Folks, PA       Or  . metoCLOPramide (REGLAN) injection 5-10 mg  5-10 mg Intravenous Q8H PRN Matthew Folks, PA      . multivitamin with minerals tablet 1 tablet  1 tablet Oral Daily Matthew Folks, [place]   1 tablet at 04/19/12 1801  . ondansetron (ZOFRAN) tablet 4 mg  4 mg Oral Q6H PRN Matthew Folks, PA       Or  . ondansetron [facility]) injection 4 mg  4 mg Intravenous Q6H PRN Matthew Folks, PA   4 mg at 04/19/12 1828  . oxyCODONE (Oxy IR/ROXICODONE) immediate release tablet 5-10 mg  5-10 mg Oral Q3H PRN Matthew Folks, PA   10 mg at 04/20/12 9604  . polyethylene glycol (MIRALAX / GLYCOLAX) packet 17 g  17 g Oral Daily PRN Matthew Folks, PA      . simvastatin (ZOCOR) tablet 10 mg  10 mg Oral q1800 Matthew Folks, [place]   10 mg at 04/19/12 2146  . [COMPLETED] warfarin (COUMADIN) tablet 6 mg  6 mg Oral ONCE-1800 Gala Lewand[employer], PHARMD   6 mg at 04/19/12 1801  . warfarin (COUMADIN) video   Does not apply Once Fayne Norrie, [facility]      . Warfarin - Pharmacist Dosing Inpatient   Does not apply q1800 Fayne Norrie, PHARMD      . zolpidem (AMBIEN) tablet 5 mg  5 mg Oral QHS PRN Matthew Folks, PA      . [DISCONTINUED] cefUROXime (ZINACEF) injection    PRN Harvie Junior, MD   1.5 g at 04/19/12 1313  . [DISCONTINUED] HYDROmorphone (DILAUDID) injection 0.25-0.5 mg  0.25-0.5 mg Intravenous Q5 min PRN Rivka Barbara, MD   0.5 mg at 04/19/12 1550  . [DISCONTINUED] lactated ringers infusion   Intravenous Continuous Rivka Barbara, MD      . [DISCONTINUED] ondansetron [facility]) injection 4 mg  4 mg Intravenous Once PRN Rivka Barbara, MD      . [DISCONTINUED] povidone-iodine (BETADINE) 7.5 % scrub   Topical Once Harvie Junior, MD      . [DISCONTINUED] sodium chloride irrigation 0.9 %    PRN Harvie Junior, MD   3,000 mL at 04/19/12 1309   Facility-Administered Medications Ordered in Other Encounters  Medication Dose Route Frequency Provider Last Rate Last Dose  . [DISCONTINUED] Bupivacaine-Epinephrine PF (MARCAINE W/ EPI (PF)) 0.5-1:200000 % injection    PRN Rivka Barbara, MD   25 mL at 04/19/12 1205  . [DISCONTINUED] fentaNYL (SUBLIMAZE) injection    PRN Sheppard Evens, CRNA   50 mcg at 04/19/12 1453  . [DISCONTINUED] glycopyrrolate (ROBINUL) injection    PRN Sheppard Evens, CRNA   0.2 mg at 04/19/12 1443  . [DISCONTINUED] lidocaine (cardiac) 100 mg/60ml (XYLOCAINE) 20 MG/ML injection 2%    PRN Sheppard Evens, CRNA   80 mg at 04/19/12 1229  . [DISCONTINUED] neostigmine (PROSTIGMINE) injection   Intravenous PRN Sheppard Evens, CRNA   3 mg at 04/19/12 1435  . [DISCONTINUED] ondansetron (ZOFRAN) injection    PRN Sheppard Evens, CRNA   4 mg at 04/19/12 1429  . [DISCONTINUED] propofol (DIPRIVAN) 10 mg/mL bolus/IV push    PRN Sheppard Evens, CRNA   200 mg at 04/19/12 1229  . [DISCONTINUED] rocuronium (ZEMURON) injection    PRN Sheppard Evens, CRNA   50 mg at 04/19/12 1239    PE: General appearance: alert, cooperative and no distress Lungs: clear to auscultation bilaterally Heart: reg rhythm.  Rate slow. No MM Extremities: No LEE Pulses: 2+ and symmetric Neurologic: Grossly normal  Lab Results:   Basename 04/20/12 0630  WBC 10.8*  HGB 13.0  HCT 37.2*  PLT 190   BMET  Basename 04/20/12 0630  NA 137  K 4.2  CL 101  CO2 27  GLUCOSE 116*  BUN 12  CREATININE 0.99  CALCIUM 8.4   PT/INR  Basename 04/20/12 0630  LABPROT 16.0*  INR 1.31    Assessment/Plan  Principal Problem:  *Osteoarthritis of right knee Active Problems:  Bradycardia  HTN (hypertension)  Plan:  S/P right TKR.   Bradycardia range 45-61 on telemetry.  Asymptomatic.  Swims 2.5-3.0k yards three times per week.  Will arrange OP heart monitor.  Will signoff.   LOS: 1 day    HAGER, BRYAN 04/20/2012 9:53 AM  I have seen and examined the patient along with HAGER, BRYAN, PA.  I have reviewed the chart, notes and new data.  I agree with PA's note.  Peak HR in 60s when walking, low 38 at night, average 45-50. He says that when he swims his HR will occasionally reach 140 bpm. He is asymptomatic w bradycardia. Outpatient monitor is not unreasonable. MCOT preferable to see HR trends. Doubt he needs any rx for bradycardia at this time.  Thurmon Fair, MD, [facility] [phone] 04/20/2012, 11:27 AM

## 2012-04-20 NOTE — Progress Notes (Signed)
Patient continues to have HR 120-170s RAF.  BP 140-160/70s  RR 16  O2 sat 92% Patient remains asymptomatic, no complaints. Cardizem bolus and gtt started via Left PIV HR 100s BP 140/70  RR 16  O2 sat 90%   IS about 2500 Pt transferred to 2923 O2 sat 88% on RA placed on 2L [place], encouraged IS TCU monitoring, RN updated on pt status

## 2012-04-20 NOTE — Progress Notes (Addendum)
Subjective:  No complaints, he can tell his HR is irregular but it doesn't bother him.   Objective:  Vital Signs in the last 24 hours: Temp:  [98.2 F (36.8 C)-99.4 F (37.4 C)] 98.6 F (37 C) (12/05 1400) Pulse Rate:  [48-93] 62  (12/05 1400) Resp:  [12-18] 17  (12/05 1400) BP: (136-160)/(54-70) 136/70 mmHg (12/05 1400) SpO2:  [96 %-99 %] 96 % (12/05 1400)  Intake/Output from previous day:  Intake/Output Summary (Last 24 hours) at 04/20/12 1625 Last data filed at 04/20/12 1500  Gross per 24 hour  Intake   1740 ml  Output   1900 ml  Net   -160 ml    Physical Exam: General appearance: alert, cooperative and no distress Lungs: clear to auscultation bilaterally Heart: irregularly irregular rhythm   Rate: 120  Rhythm: atrial fibrillation  Lab Results:  Basename 04/20/12 0630  WBC 10.8*  HGB 13.0  PLT 190    Basename 04/20/12 0630  NA 137  K 4.2  CL 101  CO2 27  GLUCOSE 116*  BUN 12  CREATININE 0.99   No results found for this basename: TROPONINI:2,CK,MB:2 in the last 72 hours Hepatic Function Panel No results found for this basename: PROT,ALBUMIN,AST,ALT,ALKPHOS,BILITOT,BILIDIR,IBILI in the last 72 hours No results found for this basename: CHOL in the last 72 hours  Basename 04/20/12 0630  INR 1.31    Imaging: Imaging results have been reviewed  Cardiac Studies:  Assessment/Plan:   Principal Problem:  *PAF (paroxysmal atrial fibrillation) Active Problems:  Osteoarthritis of right knee, 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 I will add low dose beta blocker with hold parameters. Check TSH. He is being Coumadinized for his knee. Will re evaluate in am.    Corine Shelter PA-C 04/20/2012, 4:25 PM   Occurrence of PAF suggests he may truly have underlying conduction system disease. I do not think that he will tolerate beta blocker therapy due to underlying sinus bradycardia. He probably will eventually  need a pacemaker, but this is not yet clearly indicated since he does not have bradycardia symptoms. Will see what happens with beta blocker therapy. Will be on therapeutic anticoagulation and this may be appropriate as lifelong therapy.  Thurmon Fair, MD, [facility] [phone] office [phone] pager

## 2012-04-20 NOTE — Anesthesia Postprocedure Evaluation (Signed)
  Anesthesia Post-op Note  Patient: Richard [name]  Procedure(s) Performed: Procedure(s) (LRB) with comments: COMPUTER ASSISTED TOTAL KNEE ARTHROPLASTY (Right) - GENERAL WITH PRE OP FEMORAL NERVE BLOCK  Patient Location: PACU  Anesthesia Type:General  Level of Consciousness: awake, alert , oriented and patient cooperative  Airway and Oxygen Therapy: Patient Spontanous Breathing  Post-op Pain: mild  Post-op Assessment: Post-op Vital signs reviewed  Post-op Vital Signs: stable  Complications: No apparent anesthesia complications

## 2012-04-20 NOTE — Progress Notes (Signed)
PT is not recommending SNF placement. Clinical Social Worker will sign off for now as social work intervention is no longer needed. Please consult [place] again if new need arises.   Sabino Niemann, MSW, [employer] [phone]

## 2012-04-20 NOTE — Progress Notes (Signed)
UR COMPLETED  

## 2012-04-20 NOTE — Evaluation (Signed)
Physical Therapy Evaluation Patient Details Name: Richard [name] MRN: [id number] DOB: May 03, 1941 Today's Date: 04/20/2012 Time: 0835-0900 PT Time Calculation (min): 25 min  PT Assessment / Plan / Recommendation Clinical Impression  Pt is a 71 y/o male admitted s/p right TKA along with the below PT problem list. Pt would benefit from acute PT to maximize independence and facilitate d/c home with HHPT.    PT Assessment  Patient needs continued PT services    Follow Up Recommendations  Home health PT    Does the patient have the potential to tolerate intense rehabilitation      Barriers to Discharge None      Equipment Recommendations  Rolling walker with 5" wheels    Recommendations for Other Services     Frequency 7X/week    Precautions / Restrictions Precautions Precautions: Knee Precaution Booklet Issued: No Required Braces or Orthoses: Knee Immobilizer - Right Knee Immobilizer - Right: On when out of bed or walking Restrictions Weight Bearing Restrictions: Yes RLE Weight Bearing: Weight bearing as tolerated   Pertinent Vitals/Pain 2/10 in right knee. Pt repositioned and premedicated.      Mobility  Bed Mobility Bed Mobility: Supine to Sit Supine to Sit: 4: Min assist Details for Bed Mobility Assistance: Assist for right LE due to slight pain with cues for sequence. Transfers Transfers: Sit to Stand;Stand to Sit Sit to Stand: 4: Min assist;With upper extremity assist;From bed Stand to Sit: 4: Min guard;With upper extremity assist;To chair/3-in-1 Details for Transfer Assistance: Assist and guarding for balance with cues for safest hand/right LE placement. Ambulation/Gait Ambulation/Gait Assistance: 4: Min assist (Progressing quickly to min (guard).) Ambulation Distance (Feet): 215 Feet Assistive device: Rolling walker Ambulation/Gait Assistance Details: Assist initially for slight balance due to dizzy with pt quickly progressing to min (guard) for  balance. Cues for tall posture and safe sequence. Gait Pattern: Step-to pattern;Decreased step length - right;Decreased stance time - right;Trunk flexed;Antalgic Stairs: No Wheelchair Mobility Wheelchair Mobility: No    Shoulder Instructions     Exercises Total Joint Exercises Ankle Circles/Pumps: AROM;Right;10 reps;Supine Quad Sets: AROM;Right;10 reps;Supine Heel Slides: AAROM;Right;10 reps;Supine Hip ABduction/ADduction: AROM;Right;10 reps;Supine Straight Leg Raises: AAROM;Right;10 reps;Supine   PT Diagnosis: Difficulty walking;Acute pain  PT Problem List: Decreased strength;Decreased activity tolerance;Decreased balance;Decreased mobility;Decreased range of motion;Decreased knowledge of use of DME;Decreased knowledge of precautions;Pain PT Treatment Interventions: DME instruction;Gait training;Stair training;Functional mobility training;Therapeutic activities;Therapeutic exercise;Balance training;Patient/family education   PT Goals Acute Rehab PT Goals PT Goal Formulation: With patient Time For Goal Achievement: 04/27/12 Potential to Achieve Goals: Good Pt will go Supine/Side to Sit: with modified independence PT Goal: Supine/Side to Sit - Progress: Goal set today Pt will go Sit to Supine/Side: with modified independence PT Goal: Sit to Supine/Side - Progress: Goal set today Pt will go Sit to Stand: with modified independence PT Goal: Sit to Stand - Progress: Goal set today Pt will go Stand to Sit: with modified independence PT Goal: Stand to Sit - Progress: Goal set today Pt will Ambulate: >150 feet;with modified independence;with least restrictive assistive device PT Goal: Ambulate - Progress: Goal set today Pt will Go Up / Down Stairs: 3-5 stairs;with modified independence;with least restrictive assistive device PT Goal: Up/Down Stairs - Progress: Goal set today Pt will Perform Home Exercise Program: Independently PT Goal: Perform Home Exercise Program - Progress: Goal set  today  Visit Information  Last PT Received On: 04/20/12 Assistance Needed: +1    Subjective Data  Subjective: "You can call me Thayer Ohm." Patient  Stated Goal: Get well.   Prior Functioning  Home Living Lives With: Spouse Available Help at Discharge: Family;Available 24 hours/day Type of Home: House Home Access: Stairs to enter [employer] of Steps: 3 Entrance Stairs-Rails: None Home Layout: One level Bathroom Shower/Tub: Walk-in shower;Door Dentist: None Prior Function Level of Independence: Independent Able to Take Stairs?: Yes Driving: Yes Vocation: Retired Musician: No difficulties Dominant Hand: Right    Cognition  Overall Cognitive Status: Appears within functional limits for tasks assessed/performed Arousal/Alertness: Awake/alert Orientation Level: Appears intact for tasks assessed Behavior During Session: [facility] for tasks performed    Extremity/Trunk Assessment Right Upper Extremity Assessment RUE ROM/Strength/Tone: Within functional levels RUE Sensation: WFL - Light Touch RUE Coordination: WFL - gross motor Left Upper Extremity Assessment LUE ROM/Strength/Tone: Within functional levels LUE Sensation: WFL - Light Touch LUE Coordination: WFL - gross motor Right Lower Extremity Assessment RLE ROM/Strength/Tone: Deficits;Due to pain RLE ROM/Strength/Tone Deficits: 2/5 throughout. AA/ROM right knee 0 to 75 degrees. RLE Sensation: WFL - Light Touch RLE Coordination: WFL - gross motor Left Lower Extremity Assessment LLE ROM/Strength/Tone: Within functional levels LLE Sensation: WFL - Light Touch LLE Coordination: WFL - gross motor Trunk Assessment Trunk Assessment: Normal   Balance Balance Balance Assessed: No  End of Session PT - End of Session Equipment Utilized During Treatment: Gait belt;Right knee immobilizer Activity Tolerance: Patient tolerated treatment well Patient left: in  chair;with call bell/phone within reach Nurse Communication: Mobility status CPM Right Knee CPM Right Knee: Off  GP     Cephus Shelling 04/20/2012, 9:07 AM  04/20/2012 Cephus Shelling, PT, DPT [phone]

## 2012-04-20 NOTE — Progress Notes (Signed)
ANTICOAGULATION CONSULT NOTE - Follow up Consult  Pharmacy Consult for Coumadin Indication: VTE prophylaxis x1 month s/p right-TKA  No Known Allergies Patient Measurements: Weight: 86.2 kg on 04/10/12 Height: 178 cm on 04/10/12 Vital Signs: Temp: 98.6 F (37 C) (12/05 0522) BP: 137/54 mmHg (12/05 0522) Pulse Rate: 56  (12/05 0522) Labs: Baseline labs from 04/10/12: INR 1.05, H/H 16.9/46.7, Plts 216, SCr 0.95, Albumin 4, AST/ALT 26/31, Alk Phos 74  Medical History: Past Medical History  Diagnosis Date  . H/O cardiovascular stress test     normal, done as a baseline , ? where it was done, 4-5 yrs. ago  . Hypertension   . Arthritis    Medications:  Scheduled:     . acetaminophen  1,000 mg Intravenous Q6H  . [COMPLETED]  ceFAZolin (ANCEF) IV  2 g Intravenous 60 min Pre-Op  . [COMPLETED]  ceFAZolin (ANCEF) IV  2 g Intravenous Q8H  . [COMPLETED] coumadin book   Does not apply Once  . docusate sodium  100 mg Oral BID  . [COMPLETED] fentaNYL  100 mcg Intravenous Once  . [EXPIRED] HYDROmorphone      . [COMPLETED] methocarbamol (ROBAXIN) IV  500 mg Intravenous To PACU  . multivitamin with minerals  1 tablet Oral Daily  . simvastatin  10 mg Oral q1800  . [COMPLETED] warfarin  6 mg Oral ONCE-1800  . warfarin   Does not apply Once  . Warfarin - Pharmacist Dosing Inpatient   Does not apply q1800  . [DISCONTINUED] povidone-iodine   Topical Once   Assessment: 71 y/o male patient s/p right total knee arthroplasty receiving Coumadin for VTE prophylaxis. Patient was not on anticoagulation prior to admission. Baseline labs (INR, LFTs, CBC) were all within normal limits on 04/10/12. No overt bleeding reported.  Goal of Therapy:  INR=2-3   Plan:  Repeat coumadin 6mg  today and f/u daily protime.  Verlene Mayer, PharmD, BCPS Pager [phone] 04/20/2012,9:55 AM

## 2012-04-20 NOTE — Progress Notes (Signed)
Physical Therapy Treatment Patient Details Name: Richard [name] MRN: [id number] DOB: 1941/02/17 Today's Date: 04/20/2012 Time: 1191-4782 PT Time Calculation (min): 25 min  PT Assessment / Plan / Recommendation Comments on Treatment Session  Pt s/p Rt TKR and progressing very well. On track for d/c 04/21/12. May need to practice steps again with 3 steps and wife present.    Follow Up Recommendations  Home health PT     Does the patient have the potential to tolerate intense rehabilitation     Barriers to Discharge        Equipment Recommendations  Rolling walker with 5" wheels    Recommendations for Other Services    Frequency 7X/week   Plan Discharge plan remains appropriate;Frequency remains appropriate    Precautions / Restrictions Precautions Precautions: Knee Precaution Booklet Issued: No Required Braces or Orthoses: Knee Immobilizer - Right Knee Immobilizer - Right: On when out of bed or walking Restrictions RLE Weight Bearing: Weight bearing as tolerated   Pertinent Vitals/Pain Pain 2/10 at rest; 4/10 with activity; repositioned, premedicated    Mobility  Bed Mobility Bed Mobility: Sit to Supine Sit to Supine: 5: Supervision;HOB flat Details for Bed Mobility Assistance: supervision due to drain Rt knee; pt able to lift RLE onto bed himself Transfers Transfers: Sit to Stand;Stand to Sit Sit to Stand: 4: Min guard;With upper extremity assist;With armrests;From bed;From chair/3-in-1 Stand to Sit: 4: Min guard;With upper extremity assist;With armrests;To bed Details for Transfer Assistance: x 2; required cues for safe use of RW/hand placement; when standing from mat table required 2 attempts to achieve standing due to inadequate anterior translation over BOS Ambulation/Gait Ambulation/Gait Assistance: 5: Supervision Ambulation Distance (Feet): 240 Feet Assistive device: Rolling walker Ambulation/Gait Assistance Details: vc for position in RW and to incr step  length with heel strike on RLE Gait Pattern: Step-through pattern;Decreased stride length;Decreased stance time - right Stairs: Yes Stairs Assistance: 4: Min assist Stairs Assistance Details (indicate cue type and reason): demonstrated backwards with RW prior to attempting; vc for sequencing Stair Management Technique: No rails;Backwards;With walker Number of Stairs: 2     Exercises Total Joint Exercises Ankle Circles/Pumps: AROM;Right;10 reps;Supine Quad Sets: AROM;Right;10 reps;Supine Heel Slides: AAROM;Right;10 reps;Supine Straight Leg Raises: AROM;Right;10 reps;Supine   PT Diagnosis:    PT Problem List:   PT Treatment Interventions:     PT Goals Acute Rehab PT Goals Pt will go Sit to Supine/Side: with modified independence PT Goal: Sit to Supine/Side - Progress: Progressing toward goal Pt will go Sit to Stand: with modified independence PT Goal: Sit to Stand - Progress: Progressing toward goal Pt will go Stand to Sit: with modified independence PT Goal: Stand to Sit - Progress: Progressing toward goal Pt will Ambulate: >150 feet;with modified independence;with least restrictive assistive device PT Goal: Ambulate - Progress: Progressing toward goal Pt will Go Up / Down Stairs: 3-5 stairs;with modified independence;with least restrictive assistive device PT Goal: Up/Down Stairs - Progress: Progressing toward goal Pt will Perform Home Exercise Program: Independently PT Goal: Perform Home Exercise Program - Progress: Progressing toward goal  Visit Information  Last PT Received On: 04/20/12 Assistance Needed: +1    Subjective Data  Subjective: Pt reports pain is well controlled (2/10 at rest)   Cognition  Overall Cognitive Status: Appears within functional limits for tasks assessed/performed Arousal/Alertness: Awake/alert Orientation Level: Appears intact for tasks assessed Behavior During Session: [facility] for tasks performed    Balance     End of Session PT - End of  Session Equipment Utilized During Treatment: Gait belt;Right knee immobilizer Activity Tolerance: Patient tolerated treatment well Patient left: in bed;in CPM;with call bell/phone within reach;with family/visitor present Nurse Communication: Other (comment) (placed in CPM 0-60) CPM Right Knee CPM Right Knee: On Right Knee Flexion (Degrees): 60  Right Knee Extension (Degrees): 0    GP     Deeksha Cotrell 04/20/2012, 4:19 PM Pager [phone]

## 2012-04-20 NOTE — Progress Notes (Signed)
CARE MANAGEMENT NOTE 04/20/2012  Patient:  Richard [name], Richard [name]   Account Number:  [id number]  Date Initiated:  04/20/2012  Documentation initiated by:  Vance Peper  Subjective/Objective Assessment:   71 yr old male s/p right total knee arthroplasty.     Action/Plan:   CM spoke with patient concerning Home Health and DME needs. Choice offered. Preoperatively setup with [facility], no changes.   Anticipated DC Date:  04/21/2012   Anticipated DC Plan:  HOME W HOME HEALTH SERVICES      DC Planning Services  CM consult      [facility] Choice  HOME HEALTH  DURABLE MEDICAL EQUIPMENT   Choice offered to / List presented to:  C-1 Patient        HH arranged  HH-2 PT  HH-1 RN      [facility] agency  [facility]   Status of service:  Completed, signed off Medicare Important Message given?   (If response is "NO", the following Medicare IM given date fields will be blank) Date Medicare IM given:   Date Additional Medicare IM given:    Discharge Disposition:  HOME W HOME HEALTH SERVICES  Per UR Regulation:    If discussed at Long Length of Stay Meetings, dates discussed:    Comments:

## 2012-04-21 LAB — CBC
HCT: 35.1 % — ABNORMAL LOW (ref 39.0–52.0)
Hemoglobin: 12.2 g/dL — ABNORMAL LOW (ref 13.0–17.0)
MCH: 32.6 pg (ref 26.0–34.0)
MCHC: 34.8 g/dL (ref 30.0–36.0)
MCV: 93.9 fL (ref 78.0–100.0)
Platelets: 166 10*3/uL (ref 150–400)
RBC: 3.74 MIL/uL — ABNORMAL LOW (ref 4.22–5.81)
RDW: 12.4 % (ref 11.5–15.5)
WBC: 9.8 10*3/uL (ref 4.0–10.5)

## 2012-04-21 LAB — PROTIME-INR
INR: 3.58 — ABNORMAL HIGH (ref 0.00–1.49)
Prothrombin Time: 33.7 seconds — ABNORMAL HIGH (ref 11.6–15.2)

## 2012-04-21 LAB — BASIC METABOLIC PANEL
BUN: 13 mg/dL (ref 6–23)
CO2: 28 mEq/L (ref 19–32)
Calcium: 8.7 mg/dL (ref 8.4–10.5)
Chloride: 102 mEq/L (ref 96–112)
Creatinine, Ser: 1.04 mg/dL (ref 0.50–1.35)
GFR calc Af Amer: 82 mL/min — ABNORMAL LOW (ref >90)
GFR calc non Af Amer: 71 mL/min — ABNORMAL LOW (ref >90)
Glucose, Bld: 111 mg/dL — ABNORMAL HIGH (ref 70–99)
Potassium: 4 mEq/L (ref 3.5–5.1)
Sodium: 138 mEq/L (ref 135–145)

## 2012-04-21 LAB — TSH: TSH: 2.191 u[IU]/mL (ref 0.350–4.500)

## 2012-04-21 LAB — MAGNESIUM: Magnesium: 2.2 mg/dL (ref 1.5–2.5)

## 2012-04-21 MED ORDER — HYDROCODONE-ACETAMINOPHEN 5-325 MG PO TABS: 1.0000 | ORAL_TABLET | ORAL | Status: DC | PRN

## 2012-04-21 MED ORDER — SODIUM CHLORIDE 0.9 % IV SOLN: INTRAVENOUS | Status: DC

## 2012-04-21 NOTE — Clinical Social Work Note (Signed)
Clinical Social Worker spoke with Jasmine December at [facility] and they will have availability for patient with private room on Monday. CSW will notify MD.   Rozetta Nunnery MSW, Theresia Majors [phone]

## 2012-04-21 NOTE — Progress Notes (Signed)
Physical Therapy Treatment Patient Details Name: Richard [name] MRN: [id number] DOB: [birth year] Today's Date: 04/21/2012 Time: 2130-8657 PT Time Calculation (min): 37 min  PT Assessment / Plan / Recommendation Comments on Treatment Session  Pt with episode of A-fib with RVR last night and transferred to step down unit. This morning pt with HR closer to baseline 40s-50s with no a-fib. Progressing nicely with therapy. Reports he would now like to go to SNF for continued therapies prior to d/c home because of concern for wife and how much she will need to help him at home. Pt is still appropriate for home d/c with HHPT. Will continue to work with patient and reassess his needs.     Follow Up Recommendations  Home health PT (pt now requesting SNF however)     Does the patient have the potential to tolerate intense rehabilitation     Barriers to Discharge        Equipment Recommendations  Rolling walker with 5" wheels;Tub/shower seat    Recommendations for Other Services    Frequency 7X/week   Plan Discharge plan remains appropriate;Frequency remains appropriate (pt now requesting SNF)    Precautions / Restrictions Precautions Precautions: Knee Precaution Booklet Issued: No Required Braces or Orthoses: Knee Immobilizer - Right Knee Immobilizer - Right: On when out of bed or walking Restrictions Weight Bearing Restrictions: Yes RLE Weight Bearing: Weight bearing as tolerated   Pertinent Vitals/Pain 5-6/10, RN made aware and pain meds provided prior to mobilization    Mobility  Bed Mobility Bed Mobility: Sitting - Scoot to Edge of Bed Supine to Sit: HOB elevated;5: Supervision;With rails (45 degrees) Sitting - Scoot to Edge of Bed: 5: Supervision Details for Bed Mobility Assistance: cues for sequencing Transfers Transfers: Sit to Stand;Stand to Sit Sit to Stand: 5: Supervision;With upper extremity assist;From bed Stand to Sit: With upper extremity assist;To  chair/3-in-1;With armrests Details for Transfer Assistance: cues for hand placement with regards to RW Ambulation/Gait Ambulation/Gait Assistance: 5: Supervision Ambulation Distance (Feet): 300 Feet Assistive device: Rolling walker Ambulation/Gait Assistance Details: cues for tall posture and increase step length on RLE with heel strike (appears limited by knee immobilizer) Gait Pattern: Step-through pattern;Decreased step length - right;Decreased stride length;Decreased weight shift to right Stairs: No    Exercises Total Joint Exercises Ankle Circles/Pumps: AROM;Other reps (comment);Supine (30 reps) Quad Sets: AROM;20 reps;Supine;Right Short Arc Quad: AROM;Right;20 reps;Supine Hip ABduction/ADduction: AROM;Right;20 reps;Supine Straight Leg Raises: AROM;Right;10 reps;Supine (5 degree extensor lag)    PT Goals Acute Rehab PT Goals PT Goal: Supine/Side to Sit - Progress: Progressing toward goal PT Goal: Sit to Stand - Progress: Progressing toward goal PT Goal: Stand to Sit - Progress: Progressing toward goal PT Goal: Ambulate - Progress: Progressing toward goal PT Goal: Perform Home Exercise Program - Progress: Progressing toward goal  Visit Information  Last PT Received On: 04/21/12 Assistance Needed: +1    Subjective Data  Subjective: I never had any pain just palpatations last night. Patient Stated Goal: get out of bed   Cognition  Overall Cognitive Status: Appears within functional limits for tasks assessed/performed Arousal/Alertness: Awake/alert Orientation Level: Appears intact for tasks assessed Behavior During Session: [facility] for tasks performed    Balance     End of Session PT - End of Session Equipment Utilized During Treatment: Gait belt Activity Tolerance: Patient tolerated treatment well Patient left: in chair;with call bell/phone within reach Nurse Communication: Mobility status (discussed CPM and knee immobilizer with RN) CPM Right Knee Additional Comments:   (back  on at 1935)   GP     [facility] HELEN 04/21/2012, 9:06 AM

## 2012-04-21 NOTE — Clinical Social Work Psychosocial (Signed)
     Clinical Social Work Department BRIEF PSYCHOSOCIAL ASSESSMENT 04/21/2012  Patient:  Richard [name], Richard [name]     Account Number:  [id number]     Admit date:  04/19/2012  Clinical Social Worker:  Hulan Fray  Date/Time:  04/21/2012 02:52 PM  Referred by:  RN  Date Referred:  04/21/2012 Referred for  SNF Placement   Other Referral:   Interview type:  Patient Other interview type:    PSYCHOSOCIAL DATA Living Status:  WIFE Admitted from facility:   Level of care:   Primary support name:  Leena Mccannon Primary support relationship to patient:  SPOUSE Degree of support available:   supportive    CURRENT CONCERNS Current Concerns  Post-Acute Placement   Other Concerns:    SOCIAL WORK ASSESSMENT / PLAN Clinical Social Worker received referral for patient requesting short term SNF placement at [facility]. CSW introduced self and explained reason for visit. Patient was sitting in chair and stated that he would prefer a private room at [facility] if available. If [facility] does not have a private room available, then the patient reported that he will go home with home health services. CSW will initiate SNF search to [facility] and will update patient when response is made from facility.   Assessment/plan status:  Psychosocial Support/Ongoing Assessment of Needs Other assessment/ plan:   Information/referral to community resources:   Patient is requesting [facility] only    PATIENTS/FAMILYS RESPONSE TO PLAN OF CARE: Patient is requesting [facility] SN[facility] with private room only and if they do not have availability, then he reported that he will go home with home health.

## 2012-04-21 NOTE — Progress Notes (Signed)
I have read and agree with the below note and plan. Please note pt did ambulate without knee immobilizer this afternoon without difficulty. Can the order for knee immobilizer at all times be d/c'd? Ivonne Andrew PT, DPT Pager: [phone]

## 2012-04-21 NOTE — Progress Notes (Signed)
ANTICOAGULATION CONSULT NOTE - Follow Up Consult  Pharmacy Consult for Coumadin Indication: VTE prophylaxis s/p right TKA, new afib  No Known Allergies  Vital Signs: Temp: 98.8 F (37.1 C) (12/06 0800) Temp src: Oral (12/06 0800) BP: 150/64 mmHg (12/06 0822) Pulse Rate: 50  (12/06 0822)  Labs:  Basename 04/21/12 0430 04/20/12 0630  HGB 12.2* 13.0  HCT 35.1* 37.2*  PLT 166 190  APTT -- --  LABPROT 33.7* 16.0*  INR 3.58* 1.31  HEPARINUNFRC -- --  CREATININE 1.04 0.99  CKTOTAL -- --  CKMB -- --  TROPONINI -- --    Estimated Creatinine Clearance: 68.2 ml/min (by C-G formula based on Cr of 1.04).  Assessment: 71yom started on coumadin for VTE prophylaxis s/p right TKA. INR has jumped from 1.31 to 3.58 after 2 doses of 6mg . Post-op ABLA is stable. No bleeding noted.  Transferred to CCU yesterday with new onset afib with RVR. Was started on a diltiazem gtt and converted to NSR. HR now in 40-50s. Will need PPM per cardiology. Plan for coumadin was originally for 1 month after TKA but now may be lifelong.   Goal of Therapy:  INR 2-3 Monitor platelets by anticoagulation protocol: Yes   Plan:  1) No coumadin tonight 2) Follow up INR in AM  Fredrik Rigger 04/21/2012,10:32 AM

## 2012-04-21 NOTE — Progress Notes (Signed)
Patient transferred from 2900. Telephone report received from Suzzette Righter, Charity fundraiser. Patient is alert and oriented, without pain, shortness or breath or discomfort. Oriented patient to unit, secured environment, bed locked and in lowest position, call bell within reach.

## 2012-04-21 NOTE — Progress Notes (Signed)
No acute indication for pacer at this time. From our standpoint, he would be okay to go to rehab. We will contact him for follow-up with Dr. Salena Saner in our office and likely place outpatient 30 day MCOT monitor.  Will sign off. Call with questions.  Chrystie Nose, MD, [facility] Attending Cardiologist [facility]

## 2012-04-21 NOTE — Clinical Social Work Placement (Signed)
     Clinical Social Work Department CLINICAL SOCIAL WORK PLACEMENT NOTE 04/21/2012  Patient:  Richard [name], Richard [name]  Account Number:  [id number] Admit date:  04/19/2012  Clinical Social Worker:  Hulan Fray  Date/time:  04/21/2012 05:41 PM  Clinical Social Work is seeking post-discharge placement for this patient at the following level of care:   SKILLED NURSING   (*CSW will update this form in Epic as items are completed)   04/21/2012  Patient/family provided with [facility] Department of Clinical Social Works list of facilities offering this level of care within the geographic area requested by the patient (or if unable, by the patients family).  04/21/2012  Patient/family informed of their freedom to choose among providers that offer the needed level of care, that participate in Medicare, Medicaid or managed care program needed by the patient, have an available bed and are willing to accept the patient.  04/21/2012  Patient/family informed of [facility] ownership interest in [facility], as well as of the fact that they are under no obligation to receive care at this facility.  PASARR submitted to EDS on 04/21/2012 PASARR number received from EDS on 04/21/2012  FL2 transmitted to all facilities in geographic area requested by pt/family on  04/21/2012 FL2 transmitted to all facilities within larger geographic area on   Patient informed that his/her managed care company has contracts with or will negotiate with  certain facilities, including the following:     Patient/family informed of bed offers received:   Patient chooses bed at  Physician recommends and patient chooses bed at    Patient to be transferred to  on   Patient to be transferred to facility by   The following physician request were entered in Epic:   Additional Comments:

## 2012-04-21 NOTE — Progress Notes (Signed)
Pt. Seen and examined. Agree with the NP/PA-C note as written.  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 Hard to believe that we will be able to control his a-fib with RVR without worsening bradycardia. Pacemaker seems indicated, although he is not symptomatic either with p-afib or bradycardia. Would continue warfarin for target INR around 2.0. This would not have to be held for pacemaker placement. Will have to discuss with Dr. Salena Saner, however, per his notes, it would likely not be before next week.    Chrystie Nose, MD, [facility] Attending Cardiologist [facility]

## 2012-04-21 NOTE — Progress Notes (Signed)
[facility]  Subjective: No complaints. No dizziness or SOB while in AF RVR  Objective: Vital signs in last 24 hours: Temp:  [98.1 F (36.7 C)-98.6 F (37 C)] 98.5 F (36.9 C) (12/06 0306) Pulse Rate:  [46-153] 48  (12/06 0306) Resp:  [13-21] 17  (12/06 0306) BP: (111-160)/(46-85) 119/46 mmHg (12/06 0306) SpO2:  [90 %-97 %] 97 % (12/06 0306) Weight:  [81.647 kg (180 lb)] 81.647 kg (180 lb) (12/05 2000) Last BM Date: 04/19/12  Intake/Output from previous day: 12/05 0701 - 12/06 0700 In: 720 [P.O.:710; I.V.:10] Out: 1900 [Urine:1550; Drains:350] Intake/Output this shift:    Medications Current Facility-Administered Medications  Medication Dose Route Frequency Provider Last Rate Last Dose  . 0.9 %  sodium chloride infusion   Intravenous Continuous Matthew Folks, PA      . [EXPIRED] acetaminophen (OFIRMEV) IV 1,000 mg  1,000 mg Intravenous Q6H Matthew Folks, PA   1,000 mg at 04/20/12 1249  . alum & mag hydroxide-simeth (MAALOX/MYLANTA) 200-200-20 MG/5ML suspension 30 mL  30 mL Oral Q4H PRN Matthew Folks, PA      . [COMPLETED] diltiazem (CARDIZEM) 1 mg/mL load via infusion 20 mg  20 mg Intravenous Once Abelino Derrick, [place]   20 mg at 04/20/12 1833   And  . diltiazem (CARDIZEM) 100 mg in dextrose 5 % 100 mL infusion  5-15 mg/hr Intravenous Continuous Abelino Derrick, PA   5 mg/hr at 04/20/12 1833  . diphenhydrAMINE (BENADRYL) 12.5 MG/5ML elixir 12.5-25 mg  12.5-25 mg Oral Q4H PRN Matthew Folks, PA      . docusate sodium (COLACE) capsule 100 mg  100 mg Oral BID Matthew Folks, PA   100 mg at 04/20/12 2148  . HYDROmorphone (DILAUDID) injection 1-2 mg  1-2 mg Intravenous Q3H PRN Matthew Folks, PA      . methocarbamol (ROBAXIN) tablet 500 mg  500 mg Oral Q6H PRN Matthew Folks, PA   500 mg at 04/20/12 1454   Or  . methocarbamol (ROBAXIN) 500 mg in dextrose 5 % 50 mL IVPB  500 mg Intravenous Q6H PRN Matthew Folks, PA      . metoCLOPramide (REGLAN)  tablet 5-10 mg  5-10 mg Oral Q8H PRN Matthew Folks, PA       Or  . metoCLOPramide (REGLAN) injection 5-10 mg  5-10 mg Intravenous Q8H PRN Matthew Folks, PA      . metoprolol tartrate (LOPRESSOR) tablet 12.5 mg  12.5 mg Oral BID Abelino Derrick, PA   12.5 mg at 04/20/12 1656  . multivitamin with minerals tablet 1 tablet  1 tablet Oral Daily Matthew Folks, [place]   1 tablet at 04/20/12 1752  . ondansetron (ZOFRAN) tablet 4 mg  4 mg Oral Q6H PRN Matthew Folks, PA       Or  . ondansetron [facility]) injection 4 mg  4 mg Intravenous Q6H PRN Matthew Folks, PA   4 mg at 04/19/12 1828  . oxyCODONE (Oxy IR/ROXICODONE) immediate release tablet 5-10 mg  5-10 mg Oral Q3H PRN Matthew Folks, PA   5 mg at 04/21/12 0824  . polyethylene glycol (MIRALAX / GLYCOLAX) packet 17 g  17 g Oral Daily PRN Matthew Folks, PA      . simvastatin (ZOCOR) tablet 10 mg  10 mg Oral q1800 Matthew Folks, PA   10 mg at 04/20/12 1752  . [COMPLETED] warfarin (COUMADIN) tablet 6  mg  6 mg Oral ONCE-1800 Harvie Junior, MD   6 mg at 04/20/12 1752  . warfarin (COUMADIN) video   Does not apply Once Fayne Norrie, [facility]      . Warfarin - Pharmacist Dosing Inpatient   Does not apply q1800 Fayne Norrie, PHARMD      . zolpidem (AMBIEN) tablet 5 mg  5 mg Oral QHS PRN Matthew Folks, PA        PE: General appearance: alert, cooperative and no distress  Lungs: clear to auscultation bilaterally  Heart: reg rhythm. Rate slow. 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  Currently doing well with no complaints.  Maintaining sinus brady. 40-50's.  Lopressor ordered but he will likely not get any with hold parameters.  INR supratherapeutic.  ? Timing of PPM for SSS.   LOS: 2 days    Richard [name] 04/21/2012 8:28 AM

## 2012-04-21 NOTE — Progress Notes (Signed)
Subjective: 2 Days Post-Op Procedure(s) (LRB): COMPUTER ASSISTED TOTAL KNEE ARTHROPLASTY (Right) Patient reports pain as 4 on 0-10 scale.   Progressing with PT. No cardiac sx's. Appreciate cardiology help. Pt wants to go to [facility] for rehab.  Objective: Vital signs in last 24 hours: Temp:  [98.1 F (36.7 C)-98.8 F (37.1 C)] 98.5 F (36.9 C) (12/06 1123) Pulse Rate:  [46-153] 48  (12/06 1123) Resp:  [13-21] 15  (12/06 0800) BP: (111-160)/(46-85) 146/53 mmHg (12/06 1123) SpO2:  [90 %-99 %] 99 % (12/06 1123) Weight:  [81.647 kg (180 lb)] 81.647 kg (180 lb) (12/05 2000)  Intake/Output from previous day: 12/05 0701 - 12/06 0700 In: 720 [P.O.:710; I.V.:10] Out: 1900 [Urine:1550; Drains:350] Intake/Output this shift: Total I/O In: 480 [P.O.:480] Out: 450 [Urine:450]   Basename 04/21/12 0430 04/20/12 0630  HGB 12.2* 13.0    Basename 04/21/12 0430 04/20/12 0630  WBC 9.8 10.8*  RBC 3.74* 4.01*  HCT 35.1* 37.2*  PLT 166 190    Basename 04/21/12 0430 04/20/12 0630  NA 138 137  K 4.0 4.2  CL 102 101  CO2 28 27  BUN 13 12  CREATININE 1.04 0.99  GLUCOSE 111* 116*  CALCIUM 8.7 8.4    Basename 04/21/12 0430 04/20/12 0630  LABPT -- --  INR 3.58* 1.31  Right knee exam:  Neurovascular intact Sensation intact distally Intact pulses distally Dorsiflexion/Plantar flexion intact Incision: dressing C/D/I Compartment soft  Assessment/Plan: 2 Days Post-Op Procedure(s) (LRB): COMPUTER ASSISTED TOTAL KNEE ARTHROPLASTY (Right) Tachy-Brady Syndrome per cardiology. Plan: Up with therapy Discharge to SNF when ok with cardiology. ? Timing of further cardiology treatment ?pacemaker. Coumadin per pharmacy. We would normally tx x 1 month post op. Drain pulled.  Reece Fehnel G 04/21/2012, 12:26 PM

## 2012-04-21 NOTE — Progress Notes (Signed)
Physical Therapy Treatment Patient Details Name: Richard [name] MRN: [id number] DOB: 09/24/1940 Today's Date: 04/21/2012 Time: 4098-1191 PT Time Calculation (min): 23 min  PT Assessment / Plan / Recommendation Comments on Treatment Session  Pt. admitted s/p R TKA with episodes of Afib; Pt. continues to progress with mobility; ambulated without knee immobilizer. Pt. did not report increased pain without knee immobilizer but did state that he was more aware of his R knee with ambulation. Pt. able to recall all exercises with minimal cueing. Pt. states the he wishes to be d/c to SNF secondary to his wife not being able to care for him when he returns home and due to his heart problems.     Follow Up Recommendations  SNF (Pt. requesting SNF)                 Frequency 7X/week   Plan Discharge plan remains appropriate;Frequency remains appropriate    Precautions / Restrictions Precautions Precautions: Knee Required Braces or Orthoses: Knee Immobilizer - Right Knee Immobilizer - Right: On when out of bed or walking Restrictions Weight Bearing Restrictions: Yes RLE Weight Bearing: Weight bearing as tolerated   Pertinent Vitals/Pain 2/10 pain    Mobility  Bed Mobility Supine to Sit: HOB elevated;With rails;5: Supervision (25 degrees) Sitting - Scoot to Edge of Bed: 5: Supervision Details for Bed Mobility Assistance: Pt. required cueing for sequencing. Transfers Sit to Stand: 5: Supervision;From bed Stand to Sit: 5: Supervision;To chair/3-in-1;With armrests Details for Transfer Assistance: Pt. required cueing for hand placement. Ambulation/Gait Ambulation/Gait Assistance: 5: Supervision Ambulation Distance (Feet): 300 Feet Assistive device: Rolling walker Ambulation/Gait Assistance Details: Pt. ambulated without R knee immobilizer; no buckling noted. Pt. required cueing to stand upright.  Gait Pattern: Step-through pattern;Decreased stride length;Decreased weight shift to  right Stairs: No    Exercises Total Joint Exercises Ankle Circles/Pumps: AROM;Other reps (comment);Right;Seated (30 reps) Quad Sets: AROM;Right;20 reps;Seated Heel Slides: AROM;Right;20 reps;Seated Hip ABduction/ADduction: AROM;Both;20 reps;Seated Straight Leg Raises: AROM;Right;20 reps;Seated Knee Flexion: AAROM;Right;5 reps;Seated (To increase flexion)     PT Goals Acute Rehab PT Goals PT Goal: Supine/Side to Sit - Progress: Progressing toward goal PT Goal: Sit to Stand - Progress: Progressing toward goal PT Goal: Stand to Sit - Progress: Progressing toward goal PT Goal: Ambulate - Progress: Progressing toward goal PT Goal: Perform Home Exercise Program - Progress: Progressing toward goal  Visit Information  Last PT Received On: 04/21/12 Assistance Needed: +1    Subjective Data  Subjective: "I'm hooked up to this machine."   Cognition  Overall Cognitive Status: Appears within functional limits for tasks assessed/performed Arousal/Alertness: Awake/alert Orientation Level: Appears intact for tasks assessed Behavior During Session: [facility] for tasks performed       End of Session PT - End of Session Equipment Utilized During Treatment: Gait belt Activity Tolerance: Patient tolerated treatment well Patient left: in chair;with call bell/phone within reach Nurse Communication: Mobility status     Army [place]aco SPT 04/21/2012, 2:02 PM

## 2012-04-22 LAB — CBC
HCT: 34.6 % — ABNORMAL LOW (ref 39.0–52.0)
Hemoglobin: 11.9 g/dL — ABNORMAL LOW (ref 13.0–17.0)
MCH: 32.1 pg (ref 26.0–34.0)
MCHC: 34.4 g/dL (ref 30.0–36.0)
MCV: 93.3 fL (ref 78.0–100.0)
Platelets: 165 10*3/uL (ref 150–400)
RBC: 3.71 MIL/uL — ABNORMAL LOW (ref 4.22–5.81)
RDW: 12.1 % (ref 11.5–15.5)
WBC: 10.7 10*3/uL — ABNORMAL HIGH (ref 4.0–10.5)

## 2012-04-22 LAB — PROTIME-INR
INR: 3.61 — ABNORMAL HIGH (ref 0.00–1.49)
Prothrombin Time: 33.9 seconds — ABNORMAL HIGH (ref 11.6–15.2)

## 2012-04-22 MED ORDER — METOPROLOL TARTRATE 1 MG/ML IV SOLN
5.0000 mg | INTRAVENOUS | Status: DC | PRN
  Filled 2012-04-22: qty 5

## 2012-04-22 NOTE — Progress Notes (Addendum)
Pt in Afib having tachy-brady. Pt asym at this time. Pt was PRN lopressor if rate gets out. PT had a 2.29 pause. Will continue to monitor.    Monitor for pauses >5 sec when in Afib, >3sec if in NSR. Per PA

## 2012-04-22 NOTE — Progress Notes (Addendum)
Physical Therapy Treatment Patient Details Name: Richard [name] MRN: [id number] DOB: 31-Dec-1940 Today's Date: 04/22/2012 Time: 3086-5784 PT Time Calculation (min): 24 min  PT Assessment / Plan / Recommendation Comments on Treatment Session  Pt moving very well at this date.  Spoke with pt Re: d/c plans of SNF vs Home with HHPT.  Due to pt moving so well at this time, pt would be appropriate to d/c home with HHPT.  Pt states he is agreeable to d/c home with HHPT after OT sees him to educate on ADL's.      Follow Up Recommendations  Home health PT;Supervision for mobility/OOB     Does the patient have the potential to tolerate intense rehabilitation     Barriers to Discharge        Equipment Recommendations  Rolling walker with 5" wheels;Tub/shower seat    Recommendations for Other Services OT consult  Frequency 7X/week   Plan Discharge plan needs to be updated;Frequency remains appropriate    Precautions / Restrictions Precautions Precautions: Knee Required Braces or Orthoses: Knee Immobilizer - Right Knee Immobilizer - Right: On when out of bed or walking Restrictions RLE Weight Bearing: Weight bearing as tolerated       Mobility  Bed Mobility Bed Mobility: Supine to Sit;Sitting - Scoot to Edge of Bed Supine to Sit: 6: Modified independent (Device/Increase time);HOB flat Sitting - Scoot to Edge of Bed: 6: Modified independent (Device/Increase time) Details for Bed Mobility Assistance: No physical (A) required.   Transfers Transfers: Sit to Stand;Stand to Sit Sit to Stand: 5: Supervision;With upper extremity assist;From bed Stand to Sit: 5: Supervision;With upper extremity assist;With armrests;To chair/3-in-1 Details for Transfer Assistance: Pt demonstrated safe technique.   Ambulation/Gait Ambulation/Gait Assistance: 5: Supervision Ambulation Distance (Feet): 300 Feet Assistive device: Rolling walker Ambulation/Gait Assistance Details: Pt progressing well with  fluidity/smoothness of gait pattern.  Ambulated without KI- no knee buckling noted.  Pt appears to tolerate weight through RLE well as evident in minor use of UE's on RW at this time.  Encouraged pt to slow down with directional changes & ensure balance before continuing.   Gait Pattern: Step-through pattern    Exercises Total Joint Exercises Ankle Circles/Pumps: AROM;Both;15 reps Quad Sets: AROM;Both;15 reps Straight Leg Raises: AROM;Right;10 reps Long Arc Quad: AROM;Right;10 reps Knee Flexion: AROM;Right;10 reps     PT Goals Acute Rehab PT Goals Time For Goal Achievement: 04/27/12 Potential to Achieve Goals: Good Pt will go Supine/Side to Sit: with modified independence PT Goal: Supine/Side to Sit - Progress: Met Pt will go Sit to Supine/Side: with modified independence Pt will go Sit to Stand: with modified independence PT Goal: Sit to Stand - Progress: Progressing toward goal Pt will go Stand to Sit: with modified independence PT Goal: Stand to Sit - Progress: Progressing toward goal Pt will Ambulate: >150 feet;with modified independence;with least restrictive assistive device PT Goal: Ambulate - Progress: Progressing toward goal Pt will Go Up / Down Stairs: 3-5 stairs;with modified independence;with least restrictive assistive device Pt will Perform Home Exercise Program: Independently PT Goal: Perform Home Exercise Program - Progress: Progressing toward goal  Visit Information  Last PT Received On: 04/22/12 Assistance Needed: +1          Cognition  Overall Cognitive Status: Appears within functional limits for tasks assessed/performed Arousal/Alertness: Awake/alert Orientation Level: Appears intact for tasks assessed Behavior During Session: [facility] for tasks performed         End of Session PT - End of Session Equipment Utilized  During Treatment: Gait belt Activity Tolerance: Patient tolerated treatment well Patient left: in chair;with call bell/phone within  reach;with family/visitor present Nurse Communication: Mobility status      Verdell Face, [place] [phone] 04/22/2012

## 2012-04-22 NOTE — Progress Notes (Signed)
Asked by nursing staff about elevated HR (a-fib) with exertion. Instructed to give lopressor early and wrote for prn IV lopressor for HR >120 sustained.  Call if needed over the weekend.  Chrystie Nose, MD, [facility] Attending Cardiologist [facility]

## 2012-04-22 NOTE — Progress Notes (Signed)
ANTICOAGULATION CONSULT NOTE - Follow Up Consult  Pharmacy Consult for Coumadin Indication: VTE prophylaxis s/p right TKA, new afib  No Known Allergies  Vital Signs: Temp: 97.4 F (36.3 C) (12/07 0713) Temp src: Oral (12/06 2249) BP: 126/47 mmHg (12/07 0713) Pulse Rate: 57  (12/07 0713)  Labs:  Basename 04/22/12 0625 04/21/12 0430 04/20/12 0630  HGB 11.9* 12.2* --  HCT 34.6* 35.1* 37.2*  PLT 165 166 190  APTT -- -- --  LABPROT 33.9* 33.7* 16.0*  INR 3.61* 3.58* 1.31  HEPARINUNFRC -- -- --  CREATININE -- 1.04 0.99  CKTOTAL -- -- --  CKMB -- -- --  TROPONINI -- -- --    Estimated Creatinine Clearance: 68.2 ml/min (by C-G formula based on Cr of 1.04).  Assessment: 71yom started on coumadin for VTE prophylaxis s/p right TKA. INR jumped from 1.31 to 3.58 after 2 doses of 6mg .  INR today=3.61. No bleeding noted. Transferred yesterday from CCU.    Goal of Therapy:  INR 1.5-2.0  Monitor platelets by anticoagulation protocol: Yes   Plan:  1) No coumadin tonight 2) Follow up INR in AM  Marylouise Stacks 04/22/2012,8:26 AM

## 2012-04-22 NOTE — Progress Notes (Signed)
Metoprolol was given last night at 2142 per order, BP 151/62, HR 65 which met order parameters. Though during the night his HR did remain between the mid-40's and mid-50's with occasional periods of nonsustained sinus brady into the high 30's. Asymptomatic, resting comfortably in bed, c/o minimal pain. VS at 0410-BP 131/51, HR 59. Will continue to monitor.

## 2012-04-22 NOTE — Progress Notes (Signed)
Subjective: 3 Days Post-Op Procedure(s) (LRB): COMPUTER ASSISTED TOTAL KNEE ARTHROPLASTY (Right) Patient reports pain as 2 on 0-10 scale.  Up to chair.  No cardiac symptoms other than can feel heart rate pickup.  Wonders whether it is related to some coffee he drank this morning, question caffeine.   Objective: Vital signs in last 24 hours: Temp:  [97.4 F (36.3 C)-99 F (37.2 C)] 97.4 F (36.3 C) (12/07 0713) Pulse Rate:  [48-65] 57  (12/07 0713) Resp:  [16-20] 18  (12/07 0713) BP: (126-162)/(47-62) 126/47 mmHg (12/07 0713) SpO2:  [95 %-99 %] 98 % (12/07 0713)  Intake/Output from previous day: 12/06 0701 - 12/07 0700 In: 840 [P.O.:840] Out: 1600 [Urine:1600] Intake/Output this shift: Total I/O In: 240 [P.O.:240] Out: 300 [Urine:300]   Basename 04/22/12 0625 04/21/12 0430 04/20/12 0630  HGB 11.9* 12.2* 13.0    Basename 04/22/12 0625 04/21/12 0430  WBC 10.7* 9.8  RBC 3.71* 3.74*  HCT 34.6* 35.1*  PLT 165 166    Basename 04/21/12 0430 04/20/12 0630  NA 138 137  K 4.0 4.2  CL 102 101  CO2 28 27  BUN 13 12  CREATININE 1.04 0.99  GLUCOSE 111* 116*  CALCIUM 8.7 8.4    Basename 04/22/12 0625 04/21/12 0430  LABPT -- --  INR 3.61* 3.58*   Right knee exam: Neurovascular intact Sensation intact distally Intact pulses distally Dorsiflexion/Plantar flexion intact Incision: dressing C/D/I Compartment soft  Assessment/Plan: 3 Days Post-Op Procedure(s) (LRB): COMPUTER ASSISTED TOTAL KNEE ARTHROPLASTY (Right) Tachycardia Plan: Up with therapy Patient was kept in hospital due to cardiac issues. Patient doing well overall related to his right knee.  He feels that he still wants to go to [facility].apparently they did not have a bed for him until Monday. Continue Coumadin per pharmacy.  He is supratherapeutic now.  Damiano Stamper G 04/22/2012, 8:55 AM

## 2012-04-22 NOTE — Progress Notes (Signed)
Occupational Therapy Evaluation Patient Details Name: Richard [name] MRN: [id number] DOB: 1940/12/31 Today's Date: 04/22/2012 Time:  -     OT Assessment / Plan / Recommendation Clinical Impression  71 yo s/p TKA. Pt will benefit from skilled OT services to max independence with ADL and functional mobility for ADL to facilitate D/C home. Pt's wife can provide safe support for DC home. Will see in am prior to D/C.     OT Assessment  Patient needs continued OT Services    Follow Up Recommendations  No OT follow up    Barriers to Discharge None    Equipment Recommendations  None recommended by OT    Recommendations for Other Services  none  Frequency  Min 2X/week    Precautions / Restrictions Precautions Precautions: Knee Required Braces or Orthoses: Knee Immobilizer - Right Knee Immobilizer - Right: On when out of bed or walking Restrictions RLE Weight Bearing: Weight bearing as tolerated   Pertinent Vitals/Pain 3    ADL  Eating/Feeding: Independent Where Assessed - Eating/Feeding: Edge of bed Grooming: Modified independent Upper Body Bathing: Modified independent Lower Body Bathing: Minimal assistance Where Assessed - Lower Body Bathing: Unsupported sit to stand Upper Body Dressing: Modified independent Where Assessed - Upper Body Dressing: Unsupported sitting Lower Body Dressing: Moderate assistance Toilet Transfer: Supervision/safety Equipment Used: Rolling walker Transfers/Ambulation Related to ADLs: S ADL Comments: limited LB ADL. Will benefit from AE    OT Diagnosis: Generalized weakness;Acute pain  OT Problem List: Decreased strength;Decreased range of motion;Decreased knowledge of use of DME or AE;Pain OT Treatment Interventions: Self-care/ADL training;DME and/or AE instruction;Patient/family education   OT Goals Acute Rehab OT Goals OT Goal Formulation: With patient Time For Goal Achievement: 04/29/12 Potential to Achieve Goals: Good ADL  Goals Pt Will Perform Lower Body Bathing: with modified independence;with adaptive equipment;Unsupported ADL Goal: Lower Body Bathing - Progress: Goal set today Pt Will Perform Lower Body Dressing: with modified independence;Sit to stand from chair;Unsupported;with adaptive equipment ADL Goal: Lower Body Dressing - Progress: Goal set today Pt Will Transfer to Toilet: with modified independence;with DME;Ambulation ADL Goal: Toilet Transfer - Progress: Goal set today Pt Will Perform Tub/Shower Transfer: with supervision;Ambulation;with DME ADL Goal: Tub/Shower Transfer - Progress: Goal set today  Visit Information  Last OT Received On: 04/22/12 Assistance Needed: +1    Subjective Data      Prior Functioning     Home Living Lives With: Spouse Available Help at Discharge: Family;Available 24 hours/day Type of Home: House Home Access: Stairs to enter [employer] of Steps: 3 Entrance Stairs-Rails: None Home Layout: One level Bathroom Shower/Tub: Walk-in shower;Door [employer] Toilet: Pharmacist, community: Yes How Accessible: Accessible via walker Home Adaptive Equipment: None Additional Comments: Can borrow a RW and BSC from a friend Prior Function Level of Independence: Independent Able to Take Stairs?: Yes Driving: Yes Vocation: Retired Musician: No difficulties Dominant Hand: Right         Vision/Perception  wears glasses   Cognition  Overall Cognitive Status: Appears within functional limits for tasks assessed/performed Arousal/Alertness: Awake/alert Orientation Level: Appears intact for tasks assessed Behavior During Session: [facility] for tasks performed    Extremity/Trunk Assessment Right Upper Extremity Assessment RUE ROM/Strength/Tone: Within functional levels RUE Sensation: WFL - Light Touch RUE Coordination: WFL - gross motor Left Upper Extremity Assessment LUE ROM/Strength/Tone: Within functional levels LUE  Sensation: WFL - Light Touch LUE Coordination: WFL - gross motor Right Lower Extremity Assessment RLE ROM/Strength/Tone: Deficits;Due to pain Trunk Assessment Trunk Assessment:  Normal     Mobility Bed Mobility Bed Mobility: Supine to Sit;Sitting - Scoot to Edge of Bed Supine to Sit: 6: Modified independent (Device/Increase time);HOB flat Sitting - Scoot to Edge of Bed: 6: Modified independent (Device/Increase time) Details for Bed Mobility Assistance: No physical (A) required.   Transfers Transfers: Sit to Stand;Stand to Sit Sit to Stand: 5: Supervision Stand to Sit: 5: Supervision Details for Transfer Assistance: Pt demonstrated safe technique.       Shoulder Instructions     Exercise   Balance  WFL   End of Session OT - End of Session Activity Tolerance: Patient tolerated treatment well Patient left: in bed;with call bell/phone within reach Nurse Communication: Mobility status CPM Right Knee CPM Right Knee: On Right Knee Flexion (Degrees): 80  Right Knee Extension (Degrees): 0   GO     [name],[name] 04/22/2012, 5:46 PM [facility], OTR/L  [phone] 04/22/2012

## 2012-04-22 NOTE — Progress Notes (Signed)
Pt hr RVR 110-170 this AM, Md wrote for lopressor PRN for RVR hr. Will continue to monitor

## 2012-04-23 LAB — PROTIME-INR
INR: 2.77 — ABNORMAL HIGH (ref 0.00–1.49)
Prothrombin Time: 27.9 seconds — ABNORMAL HIGH (ref 11.6–15.2)

## 2012-04-23 MED ORDER — WARFARIN SODIUM 2 MG PO TABS: ORAL_TABLET | ORAL | Status: DC

## 2012-04-23 MED ORDER — METHOCARBAMOL 500 MG PO TABS: 500.0000 mg | ORAL_TABLET | Freq: Four times a day (QID) | ORAL | Status: DC | PRN

## 2012-04-23 MED ORDER — HYDROCODONE-ACETAMINOPHEN 5-325 MG PO TABS: 1.0000 | ORAL_TABLET | ORAL | Status: DC | PRN

## 2012-04-23 NOTE — Progress Notes (Signed)
Subjective: 4 Days Post-Op Procedure(s) (LRB): COMPUTER ASSISTED TOTAL KNEE ARTHROPLASTY (Right) Patient reports pain as 3 on 0-10 scale.   Doing great with PT. Taking po/voiding ok. Wants to go home.  No cardiac sx's.  Objective: Vital signs in last 24 hours: Temp:  [97.7 F (36.5 C)-98.5 F (36.9 C)] 98.5 F (36.9 C) (12/08 0622) Pulse Rate:  [45-57] 57  (12/08 0622) Resp:  [16-18] 16  (12/08 0622) BP: (121-135)/(58-65) 121/59 mmHg (12/08 0622) SpO2:  [98 %-100 %] 99 % (12/08 0622)  Intake/Output from previous day: 12/07 0701 - 12/08 0700 In: 960 [P.O.:960] Out: 1900 [Urine:1900] Intake/Output this shift:     Basename 04/22/12 0625 04/21/12 0430  HGB 11.9* 12.2*    Basename 04/22/12 0625 04/21/12 0430  WBC 10.7* 9.8  RBC 3.71* 3.74*  HCT 34.6* 35.1*  PLT 165 166    Basename 04/21/12 0430  NA 138  K 4.0  CL 102  CO2 28  BUN 13  CREATININE 1.04  GLUCOSE 111*  CALCIUM 8.7    Basename 04/23/12 0454 04/22/12 0625  LABPT -- --  INR 2.77* 3.61*   Right knee exam: Neurovascular intact Sensation intact distally Intact pulses distally Dorsiflexion/Plantar flexion intact Incision: dressing C/D/I Compartment soft  Assessment/Plan: 4 Days Post-Op Procedure(s) (LRB): COMPUTER ASSISTED TOTAL KNEE ARTHROPLASTY (Right) Plan: Pt kept in hosp due to cardiac issues of brady-tachy syndrome. Discharge home with home health  Makalyn Lennox G 04/23/2012, 10:49 AM

## 2012-04-23 NOTE — Progress Notes (Signed)
Occupational Therapy Treatment Patient Details Name: Richard [name] MRN: [id number] DOB: 08/19/1940 Today's Date: 04/23/2012 Time: 4098-1191 OT Time Calculation (min): 43 min  OT Assessment / Plan / Recommendation Comments on Treatment Session Excellent participation. Completed all education regarding increasing independence with ADl and mobility s/p TKA. Pt very appreciative of services. DME rep called regarding need for RW and 3 in 1. No further OT needs.     Follow Up Recommendations  No OT follow up    Barriers to Discharge   none    Equipment Recommendations  Rolling walker with 5" wheels;3 in 1 bedside comode    Recommendations for Other Services  none  Frequency Min 2X/week   Plan Discharge plan remains appropriate    Precautions / Restrictions Precautions Precautions: Knee Restrictions RLE Weight Bearing: Weight bearing as tolerated   Pertinent Vitals/Pain No c/o pain    ADL  Lower Body Bathing: Other (comment);Modified independent (with AE) Upper Body Dressing: Modified independent Lower Body Dressing: Modified independent Where Assessed - Lower Body Dressing: Unsupported sit to stand Toilet Transfer: Modified independent Tub/Shower Transfer: Supervision/safety Tub/Shower Transfer Method: Science writer: Other (comment) (3v in 1) Equipment Used: Rolling walker;Reacher;Long-handled sponge;Sock aid Transfers/Ambulation Related to ADLs: S ADL Comments: Educated on use of AE for LB ADL. AE increases independence to Mod I to S. Completed shower transfer with S - RW level    OT Diagnosis:    OT Problem List:   OT Treatment Interventions:     OT Goals Acute Rehab OT Goals OT Goal Formulation: With patient Time For Goal Achievement: 04/29/12 Potential to Achieve Goals: Good ADL Goals Pt Will Perform Lower Body Bathing: with modified independence;with adaptive equipment;Unsupported ADL Goal: Lower Body Bathing - Progress: Met Pt  Will Perform Lower Body Dressing: with modified independence;Sit to stand from chair;Unsupported;with adaptive equipment ADL Goal: Lower Body Dressing - Progress: Met Pt Will Transfer to Toilet: with modified independence;with DME;Ambulation ADL Goal: Toilet Transfer - Progress: Met Pt Will Perform Tub/Shower Transfer: with supervision;Ambulation;with DME ADL Goal: Tub/Shower Transfer - Progress: Met  Visit Information  Last OT Received On: 04/23/12 Assistance Needed: +1    Subjective Data      Prior Functioning       Cognition  Overall Cognitive Status: Appears within functional limits for tasks assessed/performed Arousal/Alertness: Awake/alert Orientation Level: Appears intact for tasks assessed Behavior During Session: [facility] for tasks performed    Mobility  Shoulder Instructions Bed Mobility Bed Mobility: Supine to Sit;Sitting - Scoot to Edge of Bed Supine to Sit: 6: Modified independent (Device/Increase time) Sitting - Scoot to Edge of Bed: 6: Modified independent (Device/Increase time) Sit to Supine: 6: Modified independent (Device/Increase time) Transfers Transfers: Sit to Stand;Stand to Sit Sit to Stand: 6: Modified independent (Device/Increase time) Stand to Sit: 6: Modified independent (Device/Increase time)       Exercises      Balance  indep   End of Session OT - End of Session Activity Tolerance: Patient tolerated treatment well Patient left: in chair;with call bell/phone within reach;with family/visitor present Nurse Communication: Other (comment) (ready for D/C)  GO     Salwa Bai,[name] 04/23/2012, 3:24 PM [facility], OTR/L  [phone] 04/23/2012

## 2012-04-23 NOTE — Progress Notes (Signed)
NCM attempted call to pt's number on facesheet and they do not work. Will follow up with MD's office on tomorrow or [employer] for correct numbers. Faxed orders/F2F, facesheet, and d/c summary to [place]. Faxed orders for CPM to [facility]. Isidoro Donning RN CCM Case Mgmt phone [phone]

## 2012-04-23 NOTE — Discharge Summary (Signed)
Patient ID: Richard [name] MRN: [id number] DOB/AGE: 06/13/1940 71 y.o.  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 H/O cardiovascular stress test     normal, done as a baseline , ? where it was done, 4-5 yrs. ago  . Hypertension   . Arthritis     Surgeries: Procedure(s):Right  COMPUTER ASSISTED TOTAL KNEE ARTHROPLASTY on 04/19/2012   Consultants:  Cardiology   Dr Royann Shivers  Discharged Condition: Improved  Hospital Course: Richard [name] is an 71 y.o. male who was admitted 04/19/2012 for operative treatment ofOsteoarthritis of right knee. Patient has severe unremitting pain that affects sleep, daily activities, and work/hobbies. After pre-op clearance the patient was taken to the operating room on 04/19/2012 and underwent  Procedure(s):Right COMPUTER ASSISTED TOTAL KNEE ARTHROPLASTY.    Patient was given perioperative antibiotics: Anti-infectives     Start     Dose/Rate Route Frequency Ordered Stop   04/19/12 1800   ceFAZolin (ANCEF) IVPB 2 g/50 mL premix        2 g 100 mL/hr over 30 Minutes Intravenous Every 8 hours 04/19/12 1646 04/20/12 0236   04/19/12 1313   cefUROXime (ZINACEF) injection  Status:  Discontinued          As needed 04/19/12 1314 04/19/12 1503   04/18/12 1428   ceFAZolin (ANCEF) IVPB 2 g/50 mL premix        2 g 100 mL/hr over 30 Minutes Intravenous 60 min pre-op 04/18/12 1428 04/19/12 1235           Patient was given sequential compression devices, early ambulation, and chemoprophylaxis to prevent DVT.He had unstable heart rate intraop and post op.Cardiology felt was Brady-Tachy syndrome with A Fib. Treatment was rendered with meds.  Patient benefited  maximally from hospital stay and there were no complications.    Recent vital signs: Patient Vitals for the past 24 hrs:  BP Temp Temp src Pulse Resp SpO2  04/23/12 0622 121/59 mmHg 98.5 F (36.9 C) - 57  16  99 %  04/23/12 0300 - - - 45  16  -  04-29-2012 2209 131/65 mmHg 98.3 F (36.8 C) - 55  18  100 %  2012-04-29 2207 131/65 mmHg 98.3 F (36.8 C) Oral 57  16  -  04/29/12 1428 135/58 mmHg 97.7 F (36.5 C) Oral 53  18  98 %     Recent laboratory studies:  Basename 04/23/12 0454 Apr 29, 2012 0625 04/21/12 0430  WBC -- 10.7* 9.8  HGB -- 11.9* 12.2*  HCT -- 34.6* 35.1*  PLT -- 165 166  NA -- -- 138  K -- -- 4.0  CL -- -- 102  CO2 -- -- 28  BUN -- -- 13  CREATININE -- -- 1.04  GLUCOSE -- -- 111*  INR 2.77* 3.61* --  CALCIUM -- -- 8.7     Discharge Medications:     Medication List     As of 04/23/2012 11:02 AM    STOP taking these medications         naproxen sodium 220 MG tablet   Commonly known as: ANAPROX      TAKE these medications         HYDROcodone-acetaminophen 5-325 MG per tablet  Commonly known as: NORCO/VICODIN   Take 1-2 tablets by mouth every 4 (four) hours as needed for pain.      methocarbamol 500 MG tablet   Commonly known as: ROBAXIN   Take 1 tablet (500 mg total) by mouth every 6 (six) hours as needed (spasm).      multivitamin with minerals Tabs   Take 1 tablet by mouth daily.      simvastatin 10 MG tablet   Commonly known as: ZOCOR   Take 10 mg by mouth daily.      warfarin 2 MG tablet   Commonly known as: COUMADIN   One tablet daily unless otherwise directed. Shoot for INR of 2.0. Treat x 1 month.        Diagnostic Studies: Dg Chest 2 View  04/10/2012  *RADIOLOGY REPORT*  Clinical Data: Preoperative respiratory exam.  Osteoarthritis of the right knee.  CHEST - 2 VIEW  Comparison: None.  Findings: The heart size and pulmonary vascularity are normal and the lungs are clear.  There is significant osseous abnormality.  IMPRESSION: Normal  chest.   Original Report Authenticated By: Richard [name], M.D.     Disposition: Home      Discharge Orders    Future Orders Please Complete By Expires   Diet - low sodium heart healthy      Call MD / Call 911      Comments:   If you experience chest pain or shortness of breath, CALL 911 and be transported to the hospital emergency room.  If you develope a fever above 101 F, pus (white drainage) or increased drainage or redness at the wound, or calf pain, call your surgeon's office.   Constipation Prevention      Comments:   Drink plenty of fluids.  Prune juice may be helpful.  You may use a stool softener, such as Colace (over the counter) 100 mg twice a day.  Use MiraLax (over the counter) for constipation as needed.   Increase activity slowly as tolerated      Weight bearing as tolerated      CPM      Comments:   Continuous passive motion machine (CPM):      Use the CPM from 0 degrees  to 90 degrees for 8 hours per day.      You may increase by 5 degrees up to 100 dgrees per day.  You may break it up into 2 or 3 sessions per day.      Use CPM for 1-2 weeks or until you are told to stop.   TED hose      Comments:   Use stockings (TED hose) for 2  weeks on both leg(s).  You may remove them at night for sleeping.   Do not put a pillow under the knee. Place it under the heel.         Follow-up Information    Follow up with [name],Richard L, MD. Schedule an appointment as soon as possible for a visit in 10 days.   Contact information:   [address] [place] [address] [phone]       Follow up with Richard Fair, MD. (Our office will call you with the appointment dates and times.  The first appt will be for a heart monitor.)    Contact information:   [address] [place] [address] [phone]           Signed: Matthew Folks 04/23/2012, 11:02 AM

## 2012-04-23 NOTE — Progress Notes (Signed)
Pt experiencing episodes of severe bradycardia as low as 40 during the night. Heart rate returns to 60 when pt awakened. Is asymptomatic. Resting comfortably and without pain in knee.

## 2012-04-23 NOTE — Progress Notes (Signed)
Physical Therapy Treatment Patient Details Name: Richard [name] MRN: [id number] DOB: 1940/12/25 Today's Date: 04/23/2012 Time: 4098-1191 PT Time Calculation (min): 20 min  PT Assessment / Plan / Recommendation Comments on Treatment Session  Pt cont's to mobilize at level to safely d/c home when MD feels appropriate.      Follow Up Recommendations  Home health PT;Supervision for mobility/OOB     Does the patient have the potential to tolerate intense rehabilitation     Barriers to Discharge        Equipment Recommendations  Rolling walker with 5" wheels;Tub/shower bench    Recommendations for Other Services OT consult  Frequency 7X/week   Plan Discharge plan remains appropriate    Precautions / Restrictions Precautions Precautions: Knee Required Braces or Orthoses: Knee Immobilizer - Right Knee Immobilizer - Right: On when out of bed or walking Restrictions Weight Bearing Restrictions: Yes RLE Weight Bearing: Weight bearing as tolerated       Mobility  Bed Mobility Bed Mobility: Supine to Sit;Sitting - Scoot to Edge of Bed Supine to Sit: 6: Modified independent (Device/Increase time);HOB flat Sitting - Scoot to Edge of Bed: 6: Modified independent (Device/Increase time) Transfers Transfers: Sit to Stand;Stand to Sit Sit to Stand: 6: Modified independent (Device/Increase time);With upper extremity assist;From bed Stand to Sit: 6: Modified independent (Device/Increase time);With upper extremity assist;With armrests;To chair/3-in-1 Ambulation/Gait Ambulation/Gait Assistance: 5: Supervision Ambulation Distance (Feet): 500 Feet Assistive device: Rolling walker Ambulation/Gait Assistance Details: Cont's to improve with fluidity of gait pattern.   Gait Pattern: Step-through pattern Stairs: Yes Stairs Assistance: 4: Min assist Stairs Assistance Details (indicate cue type and reason): (A) for RW management.  Cues for sequencing & technique.   Stair Management  Technique: No rails;Backwards;With walker Number of Stairs: 2  (2x's) Wheelchair Mobility Wheelchair Mobility: No     PT Goals Acute Rehab PT Goals Time For Goal Achievement: 04/27/12 Potential to Achieve Goals: Good Pt will go Supine/Side to Sit: with modified independence PT Goal: Supine/Side to Sit - Progress: Met Pt will go Sit to Supine/Side: with modified independence Pt will go Sit to Stand: with modified independence PT Goal: Sit to Stand - Progress: Met Pt will go Stand to Sit: with modified independence PT Goal: Stand to Sit - Progress: Partly met Pt will Ambulate: >150 feet;with modified independence;with least restrictive assistive device PT Goal: Ambulate - Progress: Progressing toward goal Pt will Go Up / Down Stairs: 3-5 stairs;with modified independence;with least restrictive assistive device PT Goal: Up/Down Stairs - Progress: Progressing toward goal Pt will Perform Home Exercise Program: Independently  Visit Information  Last PT Received On: 04/23/12 Assistance Needed: +1    Subjective Data      Cognition  Overall Cognitive Status: Appears within functional limits for tasks assessed/performed Arousal/Alertness: Awake/alert Orientation Level: Appears intact for tasks assessed Behavior During Session: [facility] for tasks performed    Balance     End of Session PT - End of Session Equipment Utilized During Treatment: Gait belt Activity Tolerance: Patient tolerated treatment well Patient left: in chair;with call bell/phone within reach Nurse Communication: Mobility status CPM Right Knee CPM Right Knee: [phone]     [place], [place] [phone] 04/23/2012

## 2012-04-23 NOTE — Progress Notes (Signed)
ANTICOAGULATION CONSULT NOTE - Follow Up Consult  Pharmacy Consult for Coumadin Indication: VTE prophylaxis s/p right TKA, new afib  No Known Allergies  Vital Signs: Temp: 98.5 F (36.9 C) (12/08 0622) Temp src: Oral (12/07 2207) BP: 121/59 mmHg (12/08 0622) Pulse Rate: 57  (12/08 0622)  Labs:  Richard [name] 04/23/12 0454 04/22/12 0625 04/21/12 0430  HGB -- 11.9* 12.2*  HCT -- 34.6* 35.1*  PLT -- 165 166  APTT -- -- --  LABPROT 27.9* 33.9* 33.7*  INR 2.77* 3.61* 3.58*  HEPARINUNFRC -- -- --  CREATININE -- -- 1.04  CKTOTAL -- -- --  CKMB -- -- --  TROPONINI -- -- --    Estimated Creatinine Clearance: 68.2 ml/min (by C-G formula based on Cr of 1.04).  Assessment: 71yom started on coumadin for VTE prophylaxis s/p right TKA. INR jumped to 3.58 after 2 doses of 6mg .  Patient was diagnosed with new onset Afib in CCU and transferred back to floor on 12/6.  INR today=2.77. No bleeding noted.   Goal of Therapy:  INR 1.5-2.0  Monitor platelets by anticoagulation protocol: Yes   Plan:  No coumadin tonight. Daily PT/INR.    Wendie Simmer, PharmD, BCPS Clinical Pharmacist  Pager: [phone]

## 2012-04-24 DIAGNOSIS — Z96659 Presence of unspecified artificial knee joint: Secondary | ICD-10-CM | POA: Diagnosis not present

## 2012-04-24 DIAGNOSIS — Z471 Aftercare following joint replacement surgery: Secondary | ICD-10-CM | POA: Diagnosis not present

## 2012-04-24 DIAGNOSIS — Z7901 Long term (current) use of anticoagulants: Secondary | ICD-10-CM | POA: Diagnosis not present

## 2012-04-24 DIAGNOSIS — IMO0001 Reserved for inherently not codable concepts without codable children: Secondary | ICD-10-CM | POA: Diagnosis not present

## 2012-04-24 NOTE — Progress Notes (Signed)
Agree with note and plan to d/c home. Ivonne Andrew PT, DPT Pager: [phone]

## 2012-04-24 NOTE — Care Management Note (Signed)
    Page 1 of 2   04/24/2012     10:21:36 AM   CARE MANAGEMENT NOTE 04/24/2012  Patient:  Richard [name], Richard [name]   Account Number:  [id number]  Date Initiated:  04/20/2012  Documentation initiated by:  Vance Peper  Subjective/Objective Assessment:   71 yr old male s/p right total knee arthroplasty.     Action/Plan:   CM spoke with patient concerning Home Health and DME needs. Choice offered. Preoperatively setup with [facility], no changes.   Anticipated DC Date:  04/21/2012   Anticipated DC Plan:  HOME W HOME HEALTH SERVICES      DC Planning Services  CM consult      [facility] Choice  HOME HEALTH  DURABLE MEDICAL EQUIPMENT   Choice offered to / List presented to:  C-1 Patient   DME arranged  CPM      DME agency  [facility]     [facility] arranged  HH-2 PT  HH-1 RN      [facility] agency  [facility]   Status of service:  Completed, signed off Medicare Important Message given?   (If response is "NO", the following Medicare IM given date fields will be blank) Date Medicare IM given:   Date Additional Medicare IM given:    Discharge Disposition:  HOME W HOME HEALTH SERVICES  Per UR Regulation:    If discussed at Long Length of Stay Meetings, dates discussed:    Comments:  04/24/2012 0900 call to Dr. Luiz Blare office to get updated phone number. His area code is [phone] and not [phone]. NCM made outreach to pt and states his CPM was delivered last night by [facility]. Made him aware Genevieve Norlander was contacted for his HH. Provided contact number for [facility] agency. NCM spoke to Rich Fuchs rep and referral was received. Provided the correct number for rep. Number on facesheet was not correct. Updated his contact numbers. Isidoro Donning RN CCM Case Mgmt phone [phone]  Elliot Cousin, RN Case Manager Signed CASE MANAGEMENT Progress Notes 04/23/2012 5:36 PM NCM attempted call to pt's number on facesheet and they do not work. Will follow up with MD's office on tomorrow or  [employer] for correct numbers. Faxed orders/F2F, facesheet, and d/c summary to [place]. Faxed orders for CPM to [facility]. Isidoro Donning RN CCM Case Mgmt phone [phone]

## 2012-04-25 DIAGNOSIS — Z96659 Presence of unspecified artificial knee joint: Secondary | ICD-10-CM | POA: Diagnosis not present

## 2012-04-25 DIAGNOSIS — Z7901 Long term (current) use of anticoagulants: Secondary | ICD-10-CM | POA: Diagnosis not present

## 2012-04-25 DIAGNOSIS — Z471 Aftercare following joint replacement surgery: Secondary | ICD-10-CM | POA: Diagnosis not present

## 2012-04-25 DIAGNOSIS — IMO0001 Reserved for inherently not codable concepts without codable children: Secondary | ICD-10-CM | POA: Diagnosis not present

## 2012-04-26 DIAGNOSIS — Z96659 Presence of unspecified artificial knee joint: Secondary | ICD-10-CM | POA: Diagnosis not present

## 2012-04-26 DIAGNOSIS — IMO0001 Reserved for inherently not codable concepts without codable children: Secondary | ICD-10-CM | POA: Diagnosis not present

## 2012-04-26 DIAGNOSIS — Z471 Aftercare following joint replacement surgery: Secondary | ICD-10-CM | POA: Diagnosis not present

## 2012-04-26 DIAGNOSIS — Z7901 Long term (current) use of anticoagulants: Secondary | ICD-10-CM | POA: Diagnosis not present

## 2012-04-26 NOTE — Op Note (Signed)
NAMEZAIN, LANKFORD NO.:  [id number]  MEDICAL RECORD NO.:  [id number]  LOCATION:  [id number]                        FACILITY:  [facility]  PHYSICIAN:  Harvie Junior, M.D.   DATE OF BIRTH:  August 15, 1940  DATE OF PROCEDURE:  04/19/2012 DATE OF DISCHARGE:  04/23/2012                              OPERATIVE REPORT   PREOPERATIVE DIAGNOSIS:  End-stage degenerative joint disease, right knee.  POSTOPERATIVE DIAGNOSIS:  End-stage degenerative joint disease, right knee.  PROCEDURE: 1. Right total knee replacement with a Sigma system and Size5 femur, size 5 tibia, 38mm all poly patella and a 10mm bridging bearing . 2. Computer-assisted right total knee replacement.  SURGEON:  Harvie Junior, MD  ASSISTANT:  Marshia Ly, PA-C  ANESTHESIA:  General.  BRIEF HISTORY:  The patient is a 71 year old male with a long history of having significant right knee pain.  He had been treated conservatively for a long period of time.  He began having increasing rest pain.  He began having increasing light activity pain and night pain and because of failure of all conservative care, he was also taken to the operating room for right total knee replacement.  Preoperative imaging showed that he had severe bone-on-bone changes with varus malalignment.  Because of his young age and need for perfect neutral long alignment, computer assistance had been chose to be used preoperatively.  DESCRIPTION OF PROCEDURE:  The patient was taken to the operating room. After adequate level of anesthesia was obtained with general anesthetic, the patient was placed supine on the operating table.  The right leg was then prepped and draped in the usual sterile fashion.  Following this, the leg was exsanguinated.  Blood pressure inflated to 350 mmHg. Following this, a midline incision was made to subcutaneous tissue down to level of the extensor mechanism and a medial parapatellar arthrotomy was undertaken.   Once this was completed, attention turned to the right knee where a medial and lateral meniscus were removed as well as retropatellar fat pad, and synovial in the anterior aspect of the femur, and at this point, the arrays were placed for the computer assistance. Two pins were placed on the tibia, 2 pins on the femur, and arrays were placed.  Following this, the registration process was undertaken and this adds 30 minutes to the surgical procedure.  Once this was done, then the tibia was cut perpendicular to its long axis.  The tibia was cut perpendicular to its anatomic axis.  The distal femur was cut perpendicular to its anatomic axis.  Once this was completed, spacer blocks were put in place, and attention was then turned to the femur and sized and cut with anterior and posterior cuts, as well as chamfers and box.  Attention was turned to the tibia, which was cut and drilled and keeled and fit with a size 5 femur and a size 5 tibia, 10-mm bridging bearing trial was placed in the tibia and then cutdown the level of 13 mm and a trial was placed here, which shows a 38-mm button is chosen and the lugs were drilled, and the lugs were drilled for the femur.  Trial reduction was undertaken.  Excellent stability.  Full range of motion.  The computer assistance shows perfect gap balance in neutral long alignment.  Once this was completed, attention then turned back to the removal of trial components.  The knee was then copiously and thoroughly lavaged and suctioned dry.  Once this was completed, the final components were cemented in place, size 5 femur, size 5 tibia, 10 mm bridging bearing, and a 38 mm all polyethylene patella was placed and a clamp was placed.  Once all excess bone cement was removed and once the cement was allowed to harden.  The tourniquet was let down.  All bleeding controlled with electrocautery.  The trial poly was removed and the final polyethylene liner, size 10 is  placed, and the knee was put through a range of motion, excellent range of motion and stability.  At this point, the wound was copiously and thoroughly lavaged, suctioned dry a final time.  Medium Hemovac drain was placed.  The medial parapatellar arthrotomy was closed with a 1 Vicryl running, skin with 0 and 2-0 Vicryl, and 3-0 Monocryl subcuticular.  Sterile compressive dressings applied after benzoin, Steri-Strips were applied to the wound. The patient was taken to the recovery room, was noted to be in satisfactory condition.  Estimated blood loss for the procedure was minimal.     Harvie Junior, M.D.     Ranae Plumber  D:  04/25/2012  T:  04/26/2012  Job:  [id number]

## 2012-04-27 DIAGNOSIS — Z96659 Presence of unspecified artificial knee joint: Secondary | ICD-10-CM | POA: Diagnosis not present

## 2012-04-27 DIAGNOSIS — IMO0001 Reserved for inherently not codable concepts without codable children: Secondary | ICD-10-CM | POA: Diagnosis not present

## 2012-04-27 DIAGNOSIS — Z471 Aftercare following joint replacement surgery: Secondary | ICD-10-CM | POA: Diagnosis not present

## 2012-04-27 DIAGNOSIS — Z7901 Long term (current) use of anticoagulants: Secondary | ICD-10-CM | POA: Diagnosis not present

## 2012-04-28 DIAGNOSIS — Z7901 Long term (current) use of anticoagulants: Secondary | ICD-10-CM | POA: Diagnosis not present

## 2012-04-28 DIAGNOSIS — Z471 Aftercare following joint replacement surgery: Secondary | ICD-10-CM | POA: Diagnosis not present

## 2012-04-28 DIAGNOSIS — IMO0001 Reserved for inherently not codable concepts without codable children: Secondary | ICD-10-CM | POA: Diagnosis not present

## 2012-04-28 DIAGNOSIS — Z96659 Presence of unspecified artificial knee joint: Secondary | ICD-10-CM | POA: Diagnosis not present

## 2012-04-28 DIAGNOSIS — I4891 Unspecified atrial fibrillation: Secondary | ICD-10-CM | POA: Diagnosis not present

## 2012-05-01 DIAGNOSIS — Z471 Aftercare following joint replacement surgery: Secondary | ICD-10-CM | POA: Diagnosis not present

## 2012-05-01 DIAGNOSIS — Z7901 Long term (current) use of anticoagulants: Secondary | ICD-10-CM | POA: Diagnosis not present

## 2012-05-01 DIAGNOSIS — IMO0001 Reserved for inherently not codable concepts without codable children: Secondary | ICD-10-CM | POA: Diagnosis not present

## 2012-05-01 DIAGNOSIS — Z96659 Presence of unspecified artificial knee joint: Secondary | ICD-10-CM | POA: Diagnosis not present

## 2012-05-02 DIAGNOSIS — Z471 Aftercare following joint replacement surgery: Secondary | ICD-10-CM | POA: Diagnosis not present

## 2012-05-02 DIAGNOSIS — Z96659 Presence of unspecified artificial knee joint: Secondary | ICD-10-CM | POA: Diagnosis not present

## 2012-05-02 DIAGNOSIS — M1710 Unilateral primary osteoarthritis, unspecified knee: Secondary | ICD-10-CM | POA: Diagnosis not present

## 2012-05-02 DIAGNOSIS — Z7901 Long term (current) use of anticoagulants: Secondary | ICD-10-CM | POA: Diagnosis not present

## 2012-05-02 DIAGNOSIS — IMO0001 Reserved for inherently not codable concepts without codable children: Secondary | ICD-10-CM | POA: Diagnosis not present

## 2012-05-03 DIAGNOSIS — Z7901 Long term (current) use of anticoagulants: Secondary | ICD-10-CM | POA: Diagnosis not present

## 2012-05-03 DIAGNOSIS — IMO0001 Reserved for inherently not codable concepts without codable children: Secondary | ICD-10-CM | POA: Diagnosis not present

## 2012-05-03 DIAGNOSIS — Z96659 Presence of unspecified artificial knee joint: Secondary | ICD-10-CM | POA: Diagnosis not present

## 2012-05-03 DIAGNOSIS — Z471 Aftercare following joint replacement surgery: Secondary | ICD-10-CM | POA: Diagnosis not present

## 2012-05-04 DIAGNOSIS — Z471 Aftercare following joint replacement surgery: Secondary | ICD-10-CM | POA: Diagnosis not present

## 2012-05-04 DIAGNOSIS — Z7901 Long term (current) use of anticoagulants: Secondary | ICD-10-CM | POA: Diagnosis not present

## 2012-05-04 DIAGNOSIS — Z96659 Presence of unspecified artificial knee joint: Secondary | ICD-10-CM | POA: Diagnosis not present

## 2012-05-04 DIAGNOSIS — IMO0001 Reserved for inherently not codable concepts without codable children: Secondary | ICD-10-CM | POA: Diagnosis not present

## 2012-05-05 DIAGNOSIS — Z471 Aftercare following joint replacement surgery: Secondary | ICD-10-CM | POA: Diagnosis not present

## 2012-05-05 DIAGNOSIS — Z96659 Presence of unspecified artificial knee joint: Secondary | ICD-10-CM | POA: Diagnosis not present

## 2012-05-05 DIAGNOSIS — IMO0001 Reserved for inherently not codable concepts without codable children: Secondary | ICD-10-CM | POA: Diagnosis not present

## 2012-05-05 DIAGNOSIS — Z7901 Long term (current) use of anticoagulants: Secondary | ICD-10-CM | POA: Diagnosis not present

## 2012-05-15 DIAGNOSIS — M25569 Pain in unspecified knee: Secondary | ICD-10-CM | POA: Diagnosis not present

## 2012-05-15 DIAGNOSIS — Z96659 Presence of unspecified artificial knee joint: Secondary | ICD-10-CM | POA: Diagnosis not present

## 2012-05-19 DIAGNOSIS — Z96659 Presence of unspecified artificial knee joint: Secondary | ICD-10-CM | POA: Diagnosis not present

## 2012-05-19 DIAGNOSIS — M25569 Pain in unspecified knee: Secondary | ICD-10-CM | POA: Diagnosis not present

## 2012-05-22 DIAGNOSIS — Z96659 Presence of unspecified artificial knee joint: Secondary | ICD-10-CM | POA: Diagnosis not present

## 2012-05-22 DIAGNOSIS — I4891 Unspecified atrial fibrillation: Secondary | ICD-10-CM | POA: Diagnosis not present

## 2012-05-22 DIAGNOSIS — M25569 Pain in unspecified knee: Secondary | ICD-10-CM | POA: Diagnosis not present

## 2012-05-24 DIAGNOSIS — M25569 Pain in unspecified knee: Secondary | ICD-10-CM | POA: Diagnosis not present

## 2012-05-24 DIAGNOSIS — Z96659 Presence of unspecified artificial knee joint: Secondary | ICD-10-CM | POA: Diagnosis not present

## 2012-05-29 DIAGNOSIS — Z96659 Presence of unspecified artificial knee joint: Secondary | ICD-10-CM | POA: Diagnosis not present

## 2012-05-29 DIAGNOSIS — M25569 Pain in unspecified knee: Secondary | ICD-10-CM | POA: Diagnosis not present

## 2012-05-30 DIAGNOSIS — I4891 Unspecified atrial fibrillation: Secondary | ICD-10-CM | POA: Diagnosis not present

## 2012-05-30 DIAGNOSIS — Z7901 Long term (current) use of anticoagulants: Secondary | ICD-10-CM | POA: Diagnosis not present

## 2012-05-31 DIAGNOSIS — M25569 Pain in unspecified knee: Secondary | ICD-10-CM | POA: Diagnosis not present

## 2012-05-31 DIAGNOSIS — Z96659 Presence of unspecified artificial knee joint: Secondary | ICD-10-CM | POA: Diagnosis not present

## 2012-06-02 DIAGNOSIS — Z96659 Presence of unspecified artificial knee joint: Secondary | ICD-10-CM | POA: Diagnosis not present

## 2012-06-02 DIAGNOSIS — M25569 Pain in unspecified knee: Secondary | ICD-10-CM | POA: Diagnosis not present

## 2012-06-05 ENCOUNTER — Encounter (HOSPITAL_COMMUNITY): Payer: Self-pay | Admitting: Pharmacy Technician

## 2012-06-12 DIAGNOSIS — R5381 Other malaise: Secondary | ICD-10-CM | POA: Diagnosis not present

## 2012-06-12 DIAGNOSIS — R5383 Other fatigue: Secondary | ICD-10-CM | POA: Diagnosis not present

## 2012-06-12 DIAGNOSIS — Z01818 Encounter for other preprocedural examination: Secondary | ICD-10-CM | POA: Diagnosis not present

## 2012-06-12 DIAGNOSIS — R6889 Other general symptoms and signs: Secondary | ICD-10-CM | POA: Diagnosis not present

## 2012-06-12 DIAGNOSIS — Z96659 Presence of unspecified artificial knee joint: Secondary | ICD-10-CM | POA: Diagnosis not present

## 2012-06-12 DIAGNOSIS — M25569 Pain in unspecified knee: Secondary | ICD-10-CM | POA: Diagnosis not present

## 2012-06-14 ENCOUNTER — Other Ambulatory Visit: Payer: Self-pay | Admitting: *Deleted

## 2012-06-14 DIAGNOSIS — I495 Sick sinus syndrome: Secondary | ICD-10-CM | POA: Diagnosis not present

## 2012-06-14 DIAGNOSIS — Z7902 Long term (current) use of antithrombotics/antiplatelets: Secondary | ICD-10-CM | POA: Diagnosis not present

## 2012-06-14 DIAGNOSIS — Z7901 Long term (current) use of anticoagulants: Secondary | ICD-10-CM | POA: Diagnosis not present

## 2012-06-14 DIAGNOSIS — Z79899 Other long term (current) drug therapy: Secondary | ICD-10-CM | POA: Diagnosis not present

## 2012-06-14 DIAGNOSIS — I4510 Unspecified right bundle-branch block: Secondary | ICD-10-CM | POA: Diagnosis not present

## 2012-06-14 DIAGNOSIS — E785 Hyperlipidemia, unspecified: Secondary | ICD-10-CM | POA: Diagnosis not present

## 2012-06-14 DIAGNOSIS — I10 Essential (primary) hypertension: Secondary | ICD-10-CM | POA: Diagnosis not present

## 2012-06-14 DIAGNOSIS — I4891 Unspecified atrial fibrillation: Secondary | ICD-10-CM | POA: Diagnosis not present

## 2012-06-14 DIAGNOSIS — M25569 Pain in unspecified knee: Secondary | ICD-10-CM | POA: Diagnosis not present

## 2012-06-14 DIAGNOSIS — Z96659 Presence of unspecified artificial knee joint: Secondary | ICD-10-CM | POA: Diagnosis not present

## 2012-06-14 MED ORDER — SODIUM CHLORIDE 0.9 % IJ SOLN: 3.0000 mL | Freq: Two times a day (BID) | INTRAMUSCULAR | Status: DC

## 2012-06-14 MED ORDER — SODIUM CHLORIDE 0.9 % IV SOLN: 250.0000 mL | INTRAVENOUS | Status: DC

## 2012-06-14 MED ORDER — CEFAZOLIN SODIUM-DEXTROSE 2-3 GM-% IV SOLR
2.0000 g | INTRAVENOUS | Status: DC
  Filled 2012-06-14: qty 50

## 2012-06-14 MED ORDER — CHLORHEXIDINE GLUCONATE 4 % EX LIQD
60.0000 mL | Freq: Once | CUTANEOUS | Status: DC
  Filled 2012-06-14: qty 60

## 2012-06-14 MED ORDER — SODIUM CHLORIDE 0.9 % IJ SOLN: 3.0000 mL | INTRAMUSCULAR | Status: DC | PRN

## 2012-06-14 MED ORDER — SODIUM CHLORIDE 0.9 % IR SOLN
80.0000 mg | Status: DC
  Filled 2012-06-14: qty 2

## 2012-06-14 MED ORDER — SODIUM CHLORIDE 0.45 % IV SOLN: INTRAVENOUS | Status: DC

## 2012-06-15 ENCOUNTER — Encounter (HOSPITAL_COMMUNITY): Payer: Self-pay | Admitting: General Practice

## 2012-06-15 ENCOUNTER — Encounter (HOSPITAL_COMMUNITY)
Admission: RE | Disposition: A | Discharge status: Home / Self Care | Payer: Self-pay | Source: Ambulatory Visit | Attending: Cardiovascular Disease

## 2012-06-15 ENCOUNTER — Ambulatory Visit (HOSPITAL_COMMUNITY)
Admission: RE | Admit: 2012-06-15 | Discharge: 2012-06-16 | Disposition: A | Discharge status: Home / Self Care | Payer: Medicare Other | Source: Ambulatory Visit | Attending: Cardiovascular Disease | Admitting: Cardiovascular Disease

## 2012-06-15 DIAGNOSIS — Z7902 Long term (current) use of antithrombotics/antiplatelets: Secondary | ICD-10-CM | POA: Insufficient documentation

## 2012-06-15 DIAGNOSIS — I495 Sick sinus syndrome: Secondary | ICD-10-CM | POA: Diagnosis not present

## 2012-06-15 DIAGNOSIS — E785 Hyperlipidemia, unspecified: Secondary | ICD-10-CM | POA: Diagnosis present

## 2012-06-15 DIAGNOSIS — I4891 Unspecified atrial fibrillation: Secondary | ICD-10-CM | POA: Diagnosis not present

## 2012-06-15 DIAGNOSIS — R001 Bradycardia, unspecified: Secondary | ICD-10-CM | POA: Diagnosis present

## 2012-06-15 DIAGNOSIS — Z79899 Other long term (current) drug therapy: Secondary | ICD-10-CM | POA: Insufficient documentation

## 2012-06-15 DIAGNOSIS — I4510 Unspecified right bundle-branch block: Secondary | ICD-10-CM | POA: Diagnosis present

## 2012-06-15 DIAGNOSIS — I480 Paroxysmal atrial fibrillation: Secondary | ICD-10-CM | POA: Diagnosis present

## 2012-06-15 DIAGNOSIS — Z950 Presence of cardiac pacemaker: Secondary | ICD-10-CM | POA: Diagnosis not present

## 2012-06-15 DIAGNOSIS — I10 Essential (primary) hypertension: Secondary | ICD-10-CM | POA: Diagnosis present

## 2012-06-15 DIAGNOSIS — Z7901 Long term (current) use of anticoagulants: Secondary | ICD-10-CM

## 2012-06-15 HISTORY — DX: Hyperlipidemia, unspecified: E78.5

## 2012-06-15 HISTORY — PX: PERMANENT PACEMAKER INSERTION: SHX5480

## 2012-06-15 HISTORY — DX: Long term (current) use of anticoagulants: Z79.01

## 2012-06-15 HISTORY — PX: INSERT / REPLACE / REMOVE PACEMAKER: SUR710

## 2012-06-15 LAB — PROTIME-INR
INR: 1.1 (ref 0.00–1.49)
Prothrombin Time: 14.1 seconds (ref 11.6–15.2)

## 2012-06-15 SURGERY — PERMANENT PACEMAKER INSERTION
Anesthesia: LOCAL

## 2012-06-15 MED ORDER — FENTANYL CITRATE 0.05 MG/ML IJ SOLN
INTRAMUSCULAR | Status: AC
Start: 2012-06-15 — End: 2012-06-15
  Filled 2012-06-15: qty 2

## 2012-06-15 MED ORDER — MIDAZOLAM HCL 2 MG/2ML IJ SOLN
INTRAMUSCULAR | Status: AC
  Filled 2012-06-15: qty 2

## 2012-06-15 MED ORDER — MUPIROCIN 2 % EX OINT
TOPICAL_OINTMENT | Freq: Two times a day (BID) | CUTANEOUS | Status: DC
  Filled 2012-06-15: qty 22

## 2012-06-15 MED ORDER — LIDOCAINE HCL (PF) 1 % IJ SOLN
INTRAMUSCULAR | Status: AC
  Filled 2012-06-15: qty 60

## 2012-06-15 MED ORDER — HEPARIN (PORCINE) IN NACL 2-0.9 UNIT/ML-% IJ SOLN
INTRAMUSCULAR | Status: AC
  Filled 2012-06-15: qty 500

## 2012-06-15 MED ORDER — ONDANSETRON HCL 4 MG/2ML IJ SOLN: 4.0000 mg | Freq: Four times a day (QID) | INTRAMUSCULAR | Status: DC | PRN

## 2012-06-15 MED ORDER — METHOCARBAMOL 500 MG PO TABS: 500.0000 mg | ORAL_TABLET | Freq: Four times a day (QID) | ORAL | Status: DC | PRN

## 2012-06-15 MED ORDER — CEFAZOLIN SODIUM 1-5 GM-% IV SOLN
1.0000 g | Freq: Four times a day (QID) | INTRAVENOUS | Status: AC
  Administered 2012-06-15 – 2012-06-16 (×3): 1 g via INTRAVENOUS
  Filled 2012-06-15 (×3): qty 50

## 2012-06-15 MED ORDER — SODIUM CHLORIDE 0.9 % IV SOLN: INTRAVENOUS | Status: AC

## 2012-06-15 MED ORDER — HYDROCODONE-ACETAMINOPHEN 5-325 MG PO TABS
1.0000 | ORAL_TABLET | ORAL | Status: DC | PRN
  Administered 2012-06-15 – 2012-06-16 (×2): 1 via ORAL
  Filled 2012-06-15 (×2): qty 1

## 2012-06-15 MED ORDER — ACETAMINOPHEN 325 MG PO TABS: 325.0000 mg | ORAL_TABLET | ORAL | Status: DC | PRN

## 2012-06-15 NOTE — Progress Notes (Signed)
Utilization Review Completed [name] J. Dayana Dalporto, RN, BSN, NCM [phone]  

## 2012-06-15 NOTE — CV Procedure (Signed)
[name],Richard J Male, 72 y.o., 19-Nov-1940  Location: [facility] MRN: [id number]  CSN: [id number] Admit Dt: 06/15/12     Procedure report  Procedure performed:  1. Implantation of new dual chamber permanent pacemaker 2. Fluoroscopy 3. Light sedation 4. Left upper extremity venography  Reason for procedure: Sinus node dysfunction Tachycardia-bradycardia syndrome Bradycardia due to necessary medications  Procedure performed by: Thurmon Fair, MD  Complications: None  Estimated blood loss: <10 mL  Medications administered during procedure: Ancef 1 g intravenously Lidocaine 1% 30 mL locally,  Fentanyl 100 mcg intravenously Versed 3 mg intravenously Omnipaque 20 mL  Device details:  Generator Medtronic Advisa DR MRI model [id number] serial number [id number]  Right atrial lead Medtronic 5086 MRI-52 serial number [id number] Right ventricular lead Medtronic 5086 MRI-58 serial number [id number]  Procedure details:  After the risks and benefits of the procedure were discussed the patient provided informed consent and was brought to the cardiac cath lab in the fasting state. The patient was prepped and draped in usual sterile fashion. Local anesthesia with 1% lidocaine was administered to to the left infraclavicular area. A 5-6 cm horizontal incision was made parallel with and 2-3 cm caudal to the left clavicle. Using electrocautery and blunt dissection a prepectoral pocket was created down to the level of the pectoralis major muscle fascia. The pocket was carefully inspected for hemostasis. An antibiotic-soaked sponge was placed in the pocket.  Under fluoroscopic guidance and using the modified Seldinger technique 2 separate venipunctures were performed to access the left subclavian vein. Considerable difficulty was encountered accessing the vein. A venogram was performed via the left antecubital vein. Two J-tip guidewires were subsequently exchanged for 8 French safe  sheaths.  Under fluoroscopic guidance the ventricular lead was advanced to level of the mid to apical right ventricular septum and the active-fixation helix was deployed. Prominent current of injury was seen. Satisfactory pacing and sensing parameters were recorded. There was no evidence of diaphragmatic stimulation at maximum device output. The safe sheath was peeled away and the lead was secured in place with 2-0 silk.  In similar fashion the right atrial lead was advanced to the level of the atrial appendage. The active-fixation helix was deployed. There was prominent current of injury. Multiple sites were tested as the R waves sensing was mediocre. Eventually, satisfactory  pacing and sensing parameters were recorded. There was no evidence of diaphragmatic stimulation with pacing at maximum device output. The safe sheath was peeled away and the lead was secured in place with 2-0 silk.  The antibiotic-soaked sponge was removed from the pocket. The pocket was flushed with copious amounts of antibiotic solution. Reinspection showed excellent hemostasis.  The ventricular lead was connected to the generator and appropriate ventricular pacing was seen. Subsequently the atrial lead was also connected. Repeat testing of the lead parameters later showed excellent values.  The entire system was then carefully inserted in the pocket with care been taking that the leads and device assumed a comfortable position without pressure on the incision. Great care was taken that the leads be located deep to the generator. The pocket was then closed in layers using 2 layers of 2-0 Vicryl and cutaneous staples, after which a sterile dressing was applied.  At the end of the procedure the following lead parameters were encountered:  Right atrial lead  sensed P waves 7.9, impedance 665 ohms, threshold 0.75 V at 0.4 ms pulse width.  Right ventricular lead sensed R waves 6.4 mV, impedance 817 ohms, threshold 0.5  V at 0.4  ms pulse width.  Plan to resume warfarin tomorrow if there is no hematoma/active bleeding at the site.  Thurmon Fair, MD, [facility] [phone] office [phone] pager 06/15/2012 10:34 AM

## 2012-06-15 NOTE — H&P (Signed)
Date of Initial H&P: 06/01/2012  History reviewed, patient examined, no change in status, stable for surgery. Tachycardia-bradycardia syndrome. Here for dual chamber permanent pacemaker implantation. This procedure has been fully reviewed with the patient and written informed consent has been obtained. Thurmon Fair, MD, [facility] [phone] office [phone] pager

## 2012-06-16 ENCOUNTER — Ambulatory Visit (HOSPITAL_COMMUNITY): Payer: Medicare Other

## 2012-06-16 ENCOUNTER — Encounter (HOSPITAL_COMMUNITY): Payer: Self-pay | Admitting: Cardiology

## 2012-06-16 DIAGNOSIS — Z7901 Long term (current) use of anticoagulants: Secondary | ICD-10-CM

## 2012-06-16 DIAGNOSIS — I4891 Unspecified atrial fibrillation: Secondary | ICD-10-CM | POA: Diagnosis not present

## 2012-06-16 DIAGNOSIS — I495 Sick sinus syndrome: Secondary | ICD-10-CM | POA: Diagnosis not present

## 2012-06-16 DIAGNOSIS — Z45018 Encounter for adjustment and management of other part of cardiac pacemaker: Secondary | ICD-10-CM | POA: Diagnosis not present

## 2012-06-16 DIAGNOSIS — I10 Essential (primary) hypertension: Secondary | ICD-10-CM | POA: Diagnosis not present

## 2012-06-16 DIAGNOSIS — E785 Hyperlipidemia, unspecified: Secondary | ICD-10-CM

## 2012-06-16 DIAGNOSIS — Z950 Presence of cardiac pacemaker: Secondary | ICD-10-CM | POA: Diagnosis not present

## 2012-06-16 DIAGNOSIS — I4510 Unspecified right bundle-branch block: Secondary | ICD-10-CM | POA: Diagnosis not present

## 2012-06-16 HISTORY — DX: Long term (current) use of anticoagulants: Z79.01

## 2012-06-16 HISTORY — DX: Hyperlipidemia, unspecified: E78.5

## 2012-06-16 MED ORDER — YOU HAVE A PACEMAKER BOOK
Freq: Once | Status: AC
  Administered 2012-06-16: 07:00:00
  Filled 2012-06-16: qty 1

## 2012-06-16 NOTE — Progress Notes (Signed)
Placement of DDD Medtronic Advisa pacemaker for : Reason for procedure:  Sinus node dysfunction  Tachycardia-bradycardia syndrome (PAF with HR 140, Bradycardia with HR down to 20) Bradycardia due to necessary medications  Subjective: No complaints  Objective: Vital signs in last 24 hours: Temp:  [97.5 F (36.4 C)-98.1 F (36.7 C)] 98 F (36.7 C) (01/31 0756) Pulse Rate:  [44-62] 60  (01/31 0756) Resp:  [11-20] 13  (01/31 0756) BP: (128-168)/(66-87) 158/75 mmHg (01/31 0756) SpO2:  [96 %-100 %] 99 % (01/31 0756) Weight:  [84.052 kg (185 lb 4.8 oz)] 84.052 kg (185 lb 4.8 oz) (01/31 0500) Weight change:  Last BM Date: 06/15/12 Intake/Output from previous day: +790 01/30 0701 - 01/31 0700 In: 1390 [P.O.:840; I.V.:400; IV Piggyback:150] Out: 600 [Urine:600] Intake/Output this shift:    PE: Surgical site appears healthy  EKG:  A pacing with V sensing and occ PAC                               ATRIAL                                 VENTRICULAR  Lead imped.         551ohms                             722 ohms  Capt. Thres.          0.375V @ 0.40 ms                0.375V @ 0.40 ms      For 06/16/12  In office thres.          0.50 V @ 0.40 ms              0.50 V @ 0.40 ms Program.amp/pulse  width                           3.50 V/ 0.40 ms                3.50 V/0.40 ms  MeasP/R wave            5.8 mV                                      6.1 mV  In off P/R wave            6.6mV                                       5.38mV  Prog sens                     0.56mV                                     0.30mV           Lab Results  Component Value Date   TSH 2.191 04/21/2012        Studies/Results: Dg Chest 2 View  06/16/2012  *RADIOLOGY REPORT*  Clinical Data: New pacemaker insertion.  CHEST - 2 VIEW  Comparison: 04/10/2012  Findings: Dual lead pacer is in place.  No pneumothorax.  The heart size and vascularity are normal.  Lungs are clear.  No osseous abnormality.  IMPRESSION: No  acute disease.  New pacemaker.   Original Report Authenticated By: Francene Boyers, M.D.    06/15/12: PROCEDURE: Generator Medtronic Advisa DR MRI model [id number] serial number [id number]  Right atrial lead Medtronic 5086 MRI-52 serial number [id number]  Right ventricular lead Medtronic 5086 MRI-58 serial number [id number]   Medications: I have reviewed the patient's current medications.  Scheduled Meds:  Continuous Infusions:  PRN Meds:.acetaminophen, HYDROcodone-acetaminophen, methocarbamol, ondansetron (ZOFRAN) IV   Assessment/Plan: Principal Problem:  *S/P cardiac pacemaker procedure, 06/15/12 medtronic Advisa Active Problems:  Bradycardia, HR down to 20's at times  PAF (paroxysmal atrial fibrillation)  HTN (hypertension)  RBBB  Hyperlipidemia  PLAN: will resume warfarin today, no pneumothorax on cxr, ambulate plan d/c home follow up 1 week for pacer site check.  Coumadin check on Monday.  LOS: 1 day   INGOLD,LAURA R 06/16/2012, 8:12 AM   I have seen and examined the patient along with INGOLD,LAURA R,NP.  I have reviewed the chart, notes and new data.  I agree with NP's note.  Key new complaints: none Key examination changes: minimal site old bleeding, no active hematoma Key new findings / data: On this morning's check Atrial paced 92%, ventricular paced 0. Atrial lead impedance 551 ohm, T wave 5.8 mV, threshold 0.5 V at 0.4 seconds pulse width. Ventricular lead impedance 722ohm, R wave 6.1 mV, threshold 0.5 V at 0.4 ms pulse width  PLAN: Discharged home. Wound care inaccurate restrictions discussed and V2. Followup for wound check in one week.  Thurmon Fair, MD, [facility] [phone] 06/16/2012, 8:37 AM

## 2012-06-16 NOTE — Discharge Summary (Signed)
Physician Discharge Summary  Patient ID: Richard [name] MRN: [id number] DOB/AGE: 1941-02-02 72 y.o.  Admit date: 06/15/2012 Discharge date: 06/16/2012  Discharge Diagnoses:  Principal Problem:  *S/P cardiac pacemaker procedure, 06/15/12 medtronic Advisa Active Problems:  Bradycardia, HR down to 20's at times  PAF (paroxysmal atrial fibrillation)  HTN (hypertension)  RBBB  Hyperlipidemia  Chronic anticoagulation, on coumadin for PAF   Discharged Condition: good  Procedure 06/15/12  Dual chamber pacemaker placed by Dr. Royann Shivers:  Generator Medtronic Advisa DR MRI model [phone] serial number [id number]  Right atrial lead Medtronic 5086 MRI-52 serial number [id number]  Right ventricular lead Medtronic 5086 MRI-58 serial number [id number]     Hospital Course: 75 yowm ADMITTED ELECTIVELY FOR dual chamber PPM placement secondary to Tachy Brady syndrome with PAF with HR to 140 and bradycardia with HR down to 20, sinus node dysfunction.  Placement of Medtronic pacemaker was completed without complications.  The AM of discharge CXR without pneumothorax and pt. Without complaints.  He ambulated and was stable.  He will begin Coumadin today or if he prefers OK to wait til AM.  He will follow up next week for pacer site check.  He was seen and discharged by Dr. Royann Shivers.                                     ATRIAL                                                 VENTRICULAR  Lead imped.                 551ohms                                             722 ohms  Capt. Thres.                   0.375V @ 0.40 ms                           0.375V @ 0.40 ms For 06/16/12  In office thres.                 0.50 V @ 0.40 ms                        0.50 V @ 0.40 ms  Program.amp/pulse  width                                 v3.50 V/ 0.40 ms                               3.50 V/0.40 ms  MeasP/R wave                          5.8 mV  6.1 mV  In off P/R wave                       6.5mV                                                5.94mV  Prog sens                         0.69mV                                                      0.27mV     Consults: None  Significant Diagnostic Studies: 2 View CXR 06/16/12:  CHEST - 2 VIEW  Comparison: 04/10/2012  Findings: Dual lead pacer is in place. No pneumothorax. The heart  size and vascularity are normal. Lungs are clear. No osseous  abnormality.  IMPRESSION:  No acute disease. New pacemaker  Discharge Exam: Blood pressure 158/75, pulse 60, temperature 98 F (36.7 C), temperature source Oral, resp. rate 13, height 5\' 11"  (1.803 m), weight 84.052 kg (185 lb 4.8 oz), SpO2 99.00%.  site check: Surgical site appears healthy  Disposition: 01-Home or Self Care     Medication List     As of 06/16/2012 11:33 AM    TAKE these medications         CALCIUM CITRATE + PO   Take 1 tablet by mouth daily.      Fish Oil 300 MG Caps   Take 1 capsule by mouth daily.      HYDROcodone-acetaminophen 5-325 MG per tablet   Commonly known as: NORCO/VICODIN   Take 1-2 tablets by mouth every 4 (four) hours as needed for pain.      methocarbamol 500 MG tablet   Commonly known as: ROBAXIN   Take 1 tablet (500 mg total) by mouth every 6 (six) hours as needed (spasm).      multivitamin with minerals Tabs   Take 1 tablet by mouth daily.      simvastatin 10 MG tablet   Commonly known as: ZOCOR   Take 10 mg by mouth daily.      warfarin 2 MG tablet   Commonly known as: COUMADIN   Take 1-2 mg by mouth daily. Takes 1 mg on Monday and Friday and 2 mg on all other days.           Follow-up Information    Follow up with Abelino Derrick, PA. On 06/23/2012. (at 10:20 am)    Contact information:   [address] [place] [address] [phone]       Follow up with Thurmon Fair, MD. On 06/20/2012. (for coumadin check at 11:50 am)    Contact information:   [address] [place]  [phone]         DISCHARGE INSTRUCTIONS:  Heart Healthy diet  Begin Coumadin today or in am 06/17/12  Supplemental Discharge Instructions for  Pacemaker Patients   Signed: Toddy Boyd R 06/16/2012, 11:33 AM  /c

## 2012-06-21 DIAGNOSIS — Z7901 Long term (current) use of anticoagulants: Secondary | ICD-10-CM | POA: Diagnosis not present

## 2012-06-21 DIAGNOSIS — I495 Sick sinus syndrome: Secondary | ICD-10-CM | POA: Diagnosis not present

## 2012-07-05 DIAGNOSIS — M7750 Other enthesopathy of unspecified foot: Secondary | ICD-10-CM | POA: Diagnosis not present

## 2012-07-05 DIAGNOSIS — G5760 Lesion of plantar nerve, unspecified lower limb: Secondary | ICD-10-CM | POA: Diagnosis not present

## 2012-07-17 DIAGNOSIS — Z79899 Other long term (current) drug therapy: Secondary | ICD-10-CM | POA: Diagnosis not present

## 2012-07-26 DIAGNOSIS — M779 Enthesopathy, unspecified: Secondary | ICD-10-CM | POA: Diagnosis not present

## 2012-08-18 DIAGNOSIS — Z950 Presence of cardiac pacemaker: Secondary | ICD-10-CM | POA: Diagnosis not present

## 2012-08-18 DIAGNOSIS — M7989 Other specified soft tissue disorders: Secondary | ICD-10-CM | POA: Diagnosis not present

## 2012-08-18 DIAGNOSIS — E785 Hyperlipidemia, unspecified: Secondary | ICD-10-CM | POA: Diagnosis not present

## 2012-08-21 ENCOUNTER — Other Ambulatory Visit (HOSPITAL_COMMUNITY): Payer: Self-pay | Admitting: Cardiovascular Disease

## 2012-08-21 ENCOUNTER — Ambulatory Visit (HOSPITAL_COMMUNITY)
Admission: RE | Admit: 2012-08-21 | Discharge: 2012-08-21 | Disposition: A | Discharge status: Home / Self Care | Payer: Medicare Other | Source: Ambulatory Visit | Attending: Cardiovascular Disease | Admitting: Cardiovascular Disease

## 2012-08-21 DIAGNOSIS — I82402 Acute embolism and thrombosis of unspecified deep veins of left lower extremity: Secondary | ICD-10-CM

## 2012-08-21 DIAGNOSIS — R609 Edema, unspecified: Secondary | ICD-10-CM | POA: Insufficient documentation

## 2012-08-21 DIAGNOSIS — M7989 Other specified soft tissue disorders: Secondary | ICD-10-CM | POA: Insufficient documentation

## 2012-08-21 NOTE — Progress Notes (Signed)
Upper Ext Venous Duplex Left completed. Richard [name]

## 2012-08-28 DIAGNOSIS — M7989 Other specified soft tissue disorders: Secondary | ICD-10-CM | POA: Diagnosis not present

## 2012-08-28 DIAGNOSIS — E785 Hyperlipidemia, unspecified: Secondary | ICD-10-CM | POA: Diagnosis not present

## 2012-08-28 DIAGNOSIS — Z950 Presence of cardiac pacemaker: Secondary | ICD-10-CM | POA: Diagnosis not present

## 2012-08-29 ENCOUNTER — Other Ambulatory Visit: Payer: Self-pay | Admitting: Radiology

## 2012-08-29 ENCOUNTER — Telehealth (HOSPITAL_COMMUNITY): Payer: Self-pay | Admitting: Radiology

## 2012-08-29 ENCOUNTER — Other Ambulatory Visit: Payer: Self-pay | Admitting: Internal Medicine

## 2012-08-29 DIAGNOSIS — M7989 Other specified soft tissue disorders: Secondary | ICD-10-CM

## 2012-08-29 DIAGNOSIS — I82A19 Acute embolism and thrombosis of unspecified axillary vein: Secondary | ICD-10-CM | POA: Diagnosis not present

## 2012-08-29 DIAGNOSIS — I82B19 Acute embolism and thrombosis of unspecified subclavian vein: Secondary | ICD-10-CM | POA: Diagnosis not present

## 2012-08-29 DIAGNOSIS — Z79899 Other long term (current) drug therapy: Secondary | ICD-10-CM | POA: Diagnosis not present

## 2012-08-29 NOTE — Telephone Encounter (Signed)
Called Malachi Bonds at Dr. Lanell Matar office because procedural order had not been faxed, and I could move the patient's appointment to this afternoon once I had the order.  Malachi Bonds stated that the procedure was being done at [facility] because they had an appointment at 1300 today.

## 2012-08-30 ENCOUNTER — Ambulatory Visit (HOSPITAL_COMMUNITY): Payer: Medicare Other

## 2012-08-30 DIAGNOSIS — I808 Phlebitis and thrombophlebitis of other sites: Secondary | ICD-10-CM | POA: Diagnosis not present

## 2012-08-30 DIAGNOSIS — Z7901 Long term (current) use of anticoagulants: Secondary | ICD-10-CM | POA: Diagnosis not present

## 2012-08-30 DIAGNOSIS — I4891 Unspecified atrial fibrillation: Secondary | ICD-10-CM | POA: Diagnosis not present

## 2012-08-30 DIAGNOSIS — Z79899 Other long term (current) drug therapy: Secondary | ICD-10-CM | POA: Diagnosis not present

## 2012-09-04 DIAGNOSIS — I82409 Acute embolism and thrombosis of unspecified deep veins of unspecified lower extremity: Secondary | ICD-10-CM | POA: Diagnosis not present

## 2012-09-04 DIAGNOSIS — Z7901 Long term (current) use of anticoagulants: Secondary | ICD-10-CM | POA: Diagnosis not present

## 2012-09-04 DIAGNOSIS — I808 Phlebitis and thrombophlebitis of other sites: Secondary | ICD-10-CM | POA: Diagnosis not present

## 2012-09-08 DIAGNOSIS — I495 Sick sinus syndrome: Secondary | ICD-10-CM | POA: Diagnosis not present

## 2012-09-11 DIAGNOSIS — Z7901 Long term (current) use of anticoagulants: Secondary | ICD-10-CM | POA: Diagnosis not present

## 2012-09-11 DIAGNOSIS — I808 Phlebitis and thrombophlebitis of other sites: Secondary | ICD-10-CM | POA: Diagnosis not present

## 2012-09-14 DIAGNOSIS — H1130 Conjunctival hemorrhage, unspecified eye: Secondary | ICD-10-CM | POA: Diagnosis not present

## 2012-09-25 DIAGNOSIS — H5200 Hypermetropia, unspecified eye: Secondary | ICD-10-CM | POA: Diagnosis not present

## 2012-09-25 DIAGNOSIS — H2510 Age-related nuclear cataract, unspecified eye: Secondary | ICD-10-CM | POA: Diagnosis not present

## 2012-09-25 DIAGNOSIS — D2310 Other benign neoplasm of skin of unspecified eyelid, including canthus: Secondary | ICD-10-CM | POA: Diagnosis not present

## 2012-09-28 ENCOUNTER — Ambulatory Visit (INDEPENDENT_AMBULATORY_CARE_PROVIDER_SITE_OTHER): Payer: Medicare Other | Admitting: Pharmacist Clinician (PhC)/ Clinical Pharmacy Specialist

## 2012-09-28 VITALS — BP 120/68 | HR 72

## 2012-09-28 DIAGNOSIS — I4891 Unspecified atrial fibrillation: Secondary | ICD-10-CM | POA: Diagnosis not present

## 2012-09-28 DIAGNOSIS — Z7901 Long term (current) use of anticoagulants: Secondary | ICD-10-CM

## 2012-09-28 DIAGNOSIS — I82729 Chronic embolism and thrombosis of deep veins of unspecified upper extremity: Secondary | ICD-10-CM | POA: Insufficient documentation

## 2012-10-02 ENCOUNTER — Ambulatory Visit (INDEPENDENT_AMBULATORY_CARE_PROVIDER_SITE_OTHER): Payer: Medicare Other | Admitting: Pharmacist Clinician (PhC)/ Clinical Pharmacy Specialist

## 2012-10-02 VITALS — BP 144/80 | HR 84

## 2012-10-02 DIAGNOSIS — I4891 Unspecified atrial fibrillation: Secondary | ICD-10-CM

## 2012-10-02 DIAGNOSIS — Z7901 Long term (current) use of anticoagulants: Secondary | ICD-10-CM | POA: Diagnosis not present

## 2012-10-02 LAB — POCT INR: INR: 2

## 2012-10-13 ENCOUNTER — Ambulatory Visit (INDEPENDENT_AMBULATORY_CARE_PROVIDER_SITE_OTHER): Payer: Medicare Other | Admitting: Pharmacist Clinician (PhC)/ Clinical Pharmacy Specialist

## 2012-10-13 VITALS — BP 102/60 | HR 64

## 2012-10-13 DIAGNOSIS — Z7901 Long term (current) use of anticoagulants: Secondary | ICD-10-CM

## 2012-10-13 DIAGNOSIS — I4891 Unspecified atrial fibrillation: Secondary | ICD-10-CM | POA: Diagnosis not present

## 2012-10-18 ENCOUNTER — Telehealth: Payer: Self-pay | Admitting: Pharmacist Clinician (PhC)/ Clinical Pharmacy Specialist

## 2012-10-19 ENCOUNTER — Ambulatory Visit (INDEPENDENT_AMBULATORY_CARE_PROVIDER_SITE_OTHER): Payer: Medicare Other | Admitting: Pharmacist Clinician (PhC)/ Clinical Pharmacy Specialist

## 2012-10-19 VITALS — BP 118/72 | HR 72

## 2012-10-19 DIAGNOSIS — Z7901 Long term (current) use of anticoagulants: Secondary | ICD-10-CM

## 2012-10-19 DIAGNOSIS — I4891 Unspecified atrial fibrillation: Secondary | ICD-10-CM

## 2012-10-31 ENCOUNTER — Ambulatory Visit (INDEPENDENT_AMBULATORY_CARE_PROVIDER_SITE_OTHER): Payer: Medicare Other | Admitting: Pharmacist Clinician (PhC)/ Clinical Pharmacy Specialist

## 2012-10-31 VITALS — BP 152/80 | HR 76

## 2012-10-31 DIAGNOSIS — I4891 Unspecified atrial fibrillation: Secondary | ICD-10-CM

## 2012-10-31 DIAGNOSIS — Z7901 Long term (current) use of anticoagulants: Secondary | ICD-10-CM

## 2012-10-31 LAB — POCT INR: INR: 4.9

## 2012-11-02 ENCOUNTER — Other Ambulatory Visit: Payer: Self-pay | Admitting: Pharmacist Clinician (PhC)/ Clinical Pharmacy Specialist

## 2012-11-02 MED ORDER — WARFARIN SODIUM 2.5 MG PO TABS: 2.5000 mg | ORAL_TABLET | Freq: Every day | ORAL | Status: DC

## 2012-11-09 ENCOUNTER — Ambulatory Visit (INDEPENDENT_AMBULATORY_CARE_PROVIDER_SITE_OTHER): Payer: Medicare Other | Admitting: Pharmacist Clinician (PhC)/ Clinical Pharmacy Specialist

## 2012-11-09 VITALS — BP 130/72 | HR 80

## 2012-11-09 DIAGNOSIS — I4891 Unspecified atrial fibrillation: Secondary | ICD-10-CM

## 2012-11-09 DIAGNOSIS — Z7901 Long term (current) use of anticoagulants: Secondary | ICD-10-CM

## 2012-11-09 LAB — POCT INR: INR: 2.7

## 2012-11-20 ENCOUNTER — Ambulatory Visit (INDEPENDENT_AMBULATORY_CARE_PROVIDER_SITE_OTHER): Payer: Medicare Other | Admitting: Pharmacist Clinician (PhC)/ Clinical Pharmacy Specialist

## 2012-11-20 VITALS — BP 122/64 | HR 76

## 2012-11-20 DIAGNOSIS — I4891 Unspecified atrial fibrillation: Secondary | ICD-10-CM | POA: Diagnosis not present

## 2012-11-20 DIAGNOSIS — Z7901 Long term (current) use of anticoagulants: Secondary | ICD-10-CM | POA: Diagnosis not present

## 2012-11-30 ENCOUNTER — Ambulatory Visit (INDEPENDENT_AMBULATORY_CARE_PROVIDER_SITE_OTHER): Payer: Medicare Other | Admitting: Pharmacist Clinician (PhC)/ Clinical Pharmacy Specialist

## 2012-11-30 ENCOUNTER — Ambulatory Visit (INDEPENDENT_AMBULATORY_CARE_PROVIDER_SITE_OTHER): Payer: Medicare Other | Admitting: Cardiovascular Disease

## 2012-11-30 ENCOUNTER — Encounter: Payer: Self-pay | Admitting: Cardiovascular Disease

## 2012-11-30 VITALS — BP 120/80 | HR 72 | Ht 70.0 in | Wt 199.8 lb

## 2012-11-30 DIAGNOSIS — I4891 Unspecified atrial fibrillation: Secondary | ICD-10-CM

## 2012-11-30 DIAGNOSIS — Z7901 Long term (current) use of anticoagulants: Secondary | ICD-10-CM

## 2012-11-30 DIAGNOSIS — I82722 Chronic embolism and thrombosis of deep veins of left upper extremity: Secondary | ICD-10-CM

## 2012-11-30 DIAGNOSIS — Z950 Presence of cardiac pacemaker: Secondary | ICD-10-CM

## 2012-11-30 DIAGNOSIS — I82729 Chronic embolism and thrombosis of deep veins of unspecified upper extremity: Secondary | ICD-10-CM | POA: Diagnosis not present

## 2012-11-30 LAB — PACEMAKER DEVICE OBSERVATION

## 2012-11-30 LAB — POCT INR: INR: 2.1

## 2012-11-30 NOTE — Progress Notes (Signed)
Patient ID: Richard [name], male   DOB: [birth year], 72 y.o.   MRN: [id number]     Reason for office visit This returns for followup and is having worsening problems with the swelling in his left upper extremity. The swelling seem to have been gradually going away but after a trip to [place] recently the swelling has worsened. He has severe limitations to physical activity. He has a Masters slimmer and has had to curtail the length of his training because of arm weakness and discomfort. He has also noticed some difficulty with his golf swing. He notes that the swelling worsens after more intense activity with the upper body.  He was diagnosed with asymptomatic paroxysmal atrial fibrillation rapid ventricular response at the time of a elective knee replacement last December. He had marked background sinus bradycardia and it was not possible to treat his atrial fibrillation without backup pacemaker therapy. The pacemaker was implanted without difficulty of complications and he was started on warfarin anticoagulation. About 6 weeks later he transitioned from warfarin to Xarelto. Oertli thereafter he developed swelling in his left upper extremity. 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 His anticoagulation has been therapeutic. Today his INR is 2.1    No Known Allergies  Current Outpatient Prescriptions  Medication Sig Dispense Refill  . Calcium Citrate-Vitamin D (CALCIUM CITRATE + PO) Take 1 tablet by mouth daily.      . Multiple Vitamin (MULTIVITAMIN WITH MINERALS) TABS Take 1 tablet by mouth daily.      . Omega-3 Fatty Acids (FISH OIL) 300 MG CAPS Take 1 capsule by mouth daily.      . simvastatin (ZOCOR) 10 MG tablet Take 10 mg by mouth daily.      Marland Kitchen warfarin (COUMADIN) 2.5 MG  tablet Take 1/2 to a whole tablet by mouth as directed.       No current facility-administered medications for this visit.    Past Medical History  Diagnosis Date  . H/O cardiovascular stress test     normal, done as a baseline , ? where it was done, 4-5 yrs. ago  . Hypertension   . Arthritis   . 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 Hyperlipidemia 06/16/2012  . Chronic anticoagulation, on coumadin for PAF 06/16/2012    Past Surgical History  Procedure Laterality Date  . Colonoscopy    . Tonsillectomy    . 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 Knee arthroplasty  04/19/2012    Procedure: COMPUTER ASSISTED TOTAL KNEE ARTHROPLASTY;  Surgeon: Harvie Junior, MD;  Location: [facility] OR;  Service: Orthopedics;  Laterality: Right;  GENERAL WITH PRE OP FEMORAL NERVE BLOCK  . Insert / replace / remove pacemaker  06/15/2012    dual chamber    No family history on file.  History   Social History  . Marital Status: Married    Spouse Name: N/A    Number of Children: N/A  . Years of Education: N/A   Occupational History  . Not on file.   Social History Main Topics  . Smoking status: Never Smoker   . Smokeless tobacco: Never Used  . Alcohol Use: Yes     Comment: week- 1-2 drinks, 1-2 beers   . Drug Use: No  . Sexually  Active:    Other Topics Concern  . Not on file   Social History Narrative  . No narrative on file    Review of systems: His left arm becomes tired and weak and uncomfortable after swimming roughly 100 yards (before he consumed 500 yards without taking a rest) The patient specifically denies any chest pain at rest or with exertion, dyspnea at rest or with exertion, orthopnea, paroxysmal nocturnal dyspnea, syncope, palpitations, focal neurological deficits, intermittent claudication, lower extremity edema, unexplained weight gain, cough, hemoptysis or wheezing.  The patient also denies abdominal pain, nausea,  vomiting, dysphagia, diarrhea, constipation, polyuria, polydipsia, dysuria, hematuria, frequency, urgency, abnormal bleeding or bruising, fever, chills, unexpected weight changes, mood swings, change in skin or hair texture, change in voice quality, auditory or visual problems, allergic reactions or rashes, new musculoskeletal complaints other than usual "aches and pains".   PHYSICAL EXAM BP 120/80  Pulse 72  Ht 5\' 10"  (1.778 m)  Wt 199 lb 12.8 oz (90.629 kg)  BMI 28.67 kg/m2  General: Alert, oriented x3, no distress Head: no evidence of trauma, PERRL, EOMI, no exophtalmos or lid lag, no myxedema, no xanthelasma; normal ears, nose and oropharynx Neck: normal jugular venous pulsations and no hepatojugular reflux; brisk carotid pulses without delay and no carotid bruits Chest: clear to auscultation, no signs of consolidation by percussion or palpation, normal fremitus, symmetrical and full respiratory excursions; healthy left subclavian pacemaker site; collateral venous circulation is seen overlying his left shoulder left anterior chest and left arm Cardiovascular: normal position and quality of the apical impulse, regular rhythm, normal first and second heart sounds, no murmurs, rubs or gallops Abdomen: no tenderness or distention, no masses by palpation, no abnormal pulsatility or arterial bruits, normal bowel sounds, no hepatosplenomegaly Extremities: no clubbing, cyanosis or edema; 2+ radial, ulnar and brachial pulses bilaterally; 2+ right femoral, posterior tibial and dorsalis pedis pulses; 2+ left femoral, posterior tibial and dorsalis pedis pulses; no subclavian or femoral bruits Neurological: grossly nonfocal   EKG: Atrial paced ventricular sensed  Lipid Panel  No results found for this basename: chol, trig, hdl, cholhdl, vldl, ldlcalc    BMET    Component Value Date/Time   NA 138 04/21/2012 0430   K 4.0 04/21/2012 0430   CL 102 04/21/2012 0430   CO2 28 04/21/2012 0430   GLUCOSE  111* 04/21/2012 0430   BUN 13 04/21/2012 0430   CREATININE 1.04 04/21/2012 0430   CALCIUM 8.7 04/21/2012 0430   GFRNONAA 71* 04/21/2012 0430   GFRAA 82* 04/21/2012 0430     ASSESSMENT AND PLAN 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 Interestingly this developed when he switch from warfarin anticoagulation to Xarelto. He is now back on warfarin and has had therapeutic anticoagulation levels. The edema and swelling seem to be gradually subsiding but has returned. 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 He is a very active gentleman and enjoys swimming. The swelling and discomfort and weakness in his left arm are clearly interfering with his physical activity. He has evidence of collateral vein formation over his left upper arm shoulder and anterior chest suggesting complete subclavian vein occlusion. He is not pacemaker dependent and the device could be removed probably with these since it is still very new. Unfortunately, this may not lead to resolution of the problem. I think we should seek the  advice of an electrophysiologist more experience in lead extraction and have referred him to Dr. Sharrell Ku at the [facility]. Before he goes there I think we should retrieve the actual images of his left upper extremity venogram as these might be helpful in reaching a decision. Consideration could be given to implantation of the leaflets right ventricular pacemaker, but this would be a less than desirable option since he mostly maintained sinus rhythm and has only paroxysmal atrial fibrillation. Lifelong anticoagulation remains important, unless successful radiofrequency ablation of AF can be performed. Ablation was never really entertained as an option at the time of arrhythmia diagnosis, since he had asymptomatic atrial  fibrillation.  Atrial fibrillation He continues to have relatively frequent episodes of paroxysmal atrial fibrillation, lasting up to 2 hours in duration.  Less frequently he has a regular atrial tachycardia that conducts 1:1 and is therefore associated with high ventricular rates. Both types of arrhythmia are asymptomatic. The background atrial mechanism is marked sinus bradycardia and he paces the atrium greater than 95% of the time. There is virtually no ventricular pacing .  S/P cardiac pacemaker procedure, 06/15/12 medtronic Advisa He has a normally functioning dual-chamber permanent pacemaker with good lead parameters, greater than 95% atrial pacing, only 3.5% ventricular pacing (programmed MVP-R). Unfortunately he seems to have pacemaker lead related to occlusion of the left subclavian vein.   Meds ordered this encounter  Medications  . warfarin (COUMADIN) 2.5 MG tablet    Sig: Take 1/2 to a whole tablet by mouth as directed.    Junious Silk, MD, [facility] [phone] office [phone] pager

## 2012-11-30 NOTE — Assessment & Plan Note (Addendum)
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 Interestingly this developed when he switch from warfarin anticoagulation to Xarelto. He is now back on warfarin and has had therapeutic anticoagulation levels. The edema and swelling seem to be gradually subsiding but has returned. 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 He is a very active gentleman and enjoys swimming. The swelling and discomfort and weakness in his left arm are clearly interfering with his physical activity. He has evidence of collateral vein formation over his left upper arm shoulder and anterior chest suggesting complete subclavian vein occlusion. He is not pacemaker dependent and the device could be removed probably with these since it is still very new. Unfortunately, this may not lead to resolution of the problem. I think we should seek the advice of an electrophysiologist more experience in lead extraction and have referred him to Dr. Sharrell Ku at the [facility]. Before he goes there I think we should retrieve the actual images of his left upper extremity venogram as these might be helpful in reaching a decision. Consideration could be given to implantation of the leaflets right ventricular pacemaker, but this would be a less than desirable option since he mostly maintained sinus rhythm and has only paroxysmal atrial fibrillation. Lifelong anticoagulation remains important, unless successful radiofrequency ablation of AF can be performed. Ablation was never really entertained as an option at the time of arrhythmia diagnosis, since he had asymptomatic atrial fibrillation.

## 2012-11-30 NOTE — Assessment & Plan Note (Signed)
He has a normally functioning dual-chamber permanent pacemaker with good lead parameters, greater than 95% atrial pacing, only 3.5% ventricular pacing (programmed MVP-R). Unfortunately he seems to have pacemaker lead related to occlusion of the left subclavian vein.

## 2012-11-30 NOTE — Patient Instructions (Addendum)
Your physician recommends that you schedule a follow-up appointment in: 3 months  Referral will be made to Dr. Ladona Ridgel for Left subclavian vein occlusion

## 2012-11-30 NOTE — Assessment & Plan Note (Signed)
He continues to have relatively frequent episodes of paroxysmal atrial fibrillation, lasting up to 2 hours in duration.  Less frequently he has a regular atrial tachycardia that conducts 1:1 and is therefore associated with high ventricular rates. Both types of arrhythmia are asymptomatic. The background atrial mechanism is marked sinus bradycardia and he paces the atrium greater than 95% of the time. There is virtually no ventricular pacing .

## 2012-12-04 ENCOUNTER — Ambulatory Visit: Payer: Medicare Other | Admitting: Pharmacist Clinician (PhC)/ Clinical Pharmacy Specialist

## 2012-12-12 ENCOUNTER — Ambulatory Visit (INDEPENDENT_AMBULATORY_CARE_PROVIDER_SITE_OTHER): Payer: Medicare Other | Admitting: Internal Medicine

## 2012-12-12 ENCOUNTER — Encounter: Payer: Self-pay | Admitting: Internal Medicine

## 2012-12-12 VITALS — BP 150/82 | HR 70 | Ht 70.5 in | Wt 196.2 lb

## 2012-12-12 DIAGNOSIS — I82729 Chronic embolism and thrombosis of deep veins of unspecified upper extremity: Secondary | ICD-10-CM | POA: Diagnosis not present

## 2012-12-12 DIAGNOSIS — I82722 Chronic embolism and thrombosis of deep veins of left upper extremity: Secondary | ICD-10-CM

## 2012-12-12 NOTE — Patient Instructions (Addendum)
Your physician wants you to follow-up in: Nov 2014 with Dr Court Joy will receive a reminder letter in the mail two months in advance. If you don't receive a letter, please call our office to schedule the follow-up appointment.

## 2012-12-12 NOTE — Assessment & Plan Note (Signed)
I have discussed the treatment options with the patient. Watchful waiting with purposeful elevation of the left arm with re-assessment of the swelling of the left arm in 2-3 months seems most appropriate. Removal of the device, replacing it in the contralateral vein, perhaps with only a single lead with the procedure of extraction and reinsertion done at the same time would also seem appropriate. I think it would be most appropriate to watch a few more months with the plan to remove the device and insert a new lead with the old device on chronic anti-coagulation on the contralateral side if the swelling does not improve.

## 2012-12-12 NOTE — Progress Notes (Signed)
HPI [name] is referred today by Dr. Royann Shivers for consideration for PPM extraction secondary to a chronic, symptomatic left subclavian vein thrombosis. The patient has a h/o PAF with an RVR, and is s/p PPM in the setting of symptomatic bradycardia. The patient switched from coumadin to xarelto one month after his PPM insertion and subsequently developed left arm swelling. He had documented subclavian vein thrombosis by venography. He was placed back on coumadin. He has been stable in the interim except that he has persistent swelling in his left arm. He has tried to elevate the arm somewhat but has not been systematic about this. He denies syncope, sob, or chest pain. He paces in the atrium approx. 95% of the time due to sinus node dysfunction but does not typically pace in the ventricle. He denies lower extremity edema. No Known Allergies   Current Outpatient Prescriptions  Medication Sig Dispense Refill  . Calcium Citrate-Vitamin D (CALCIUM CITRATE + PO) Take 1 tablet by mouth daily.      . Multiple Vitamin (MULTIVITAMIN WITH MINERALS) TABS Take 1 tablet by mouth daily.      . Omega-3 Fatty Acids (FISH OIL) 300 MG CAPS Take 1 capsule by mouth daily.      . simvastatin (ZOCOR) 10 MG tablet Take 10 mg by mouth daily.      Marland Kitchen warfarin (COUMADIN) 2.5 MG tablet Take 1/2 to a whole tablet by mouth as directed.       No current facility-administered medications for this visit.     Past Medical History  Diagnosis Date  . H/O cardiovascular stress test     normal, done as a baseline , ? where it was done, 4-5 yrs. ago  . Hypertension   . Arthritis   . 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 Hyperlipidemia 06/16/2012  . Chronic anticoagulation, on coumadin for PAF 06/16/2012    ROS:   All systems reviewed and negative except as noted in the HPI.   Past Surgical History  Procedure Laterality Date  . Colonoscopy    . Tonsillectomy    . Total knee  arthroplasty  04/19/2012    RIGHT KNEE  . Knee arthroplasty  04/19/2012    Procedure: COMPUTER ASSISTED TOTAL KNEE ARTHROPLASTY;  Surgeon: Harvie Junior, MD;  Location: [facility] OR;  Service: Orthopedics;  Laterality: Right;  GENERAL WITH PRE OP FEMORAL NERVE BLOCK  . Insert / replace / remove pacemaker  06/15/2012    dual chamber     No family history on file.   History   Social History  . Marital Status: Married    Spouse Name: N/A    Number of Children: N/A  . Years of Education: N/A   Occupational History  . Not on file.   Social History Main Topics  . Smoking status: Never Smoker   . Smokeless tobacco: Never Used  . Alcohol Use: Yes     Comment: week- 1-2 drinks, 1-2 beers   . Drug Use: No  . Sexually Active:    Other Topics Concern  . Not on file   Social History Narrative  . No narrative on file     BP 150/82  Pulse 70  Ht 5' 10.5" (1.791 m)  Wt 196 lb 3.2 oz (88.996 kg)  BMI 27.74 kg/m2  Physical Exam:  Well appearing 72 yo man, NAD HEENT: Unremarkable Neck:  7 cm JVD, no thyromegally Back:  No CVA tenderness Lungs:  Clear with  no wheezes, rales, or rhonchi HEART:  Regular rate rhythm, no murmurs, no rubs, no clicks Abd:  soft, positive bowel sounds, no organomegally, no rebound, no guarding Ext:  2 plus pulses, left arm swollen up to the shoulder, no cyanosis, no clubbing Skin:  No rashes no nodules Neuro:  CN II through XII intact, motor grossly intact  EKG - NSR with atrial pacing   Assess/Plan:

## 2012-12-18 ENCOUNTER — Ambulatory Visit (INDEPENDENT_AMBULATORY_CARE_PROVIDER_SITE_OTHER): Payer: Medicare Other | Admitting: Pharmacist Clinician (PhC)/ Clinical Pharmacy Specialist

## 2012-12-18 DIAGNOSIS — I82729 Chronic embolism and thrombosis of deep veins of unspecified upper extremity: Secondary | ICD-10-CM

## 2012-12-18 DIAGNOSIS — Z7901 Long term (current) use of anticoagulants: Secondary | ICD-10-CM

## 2012-12-18 DIAGNOSIS — I4891 Unspecified atrial fibrillation: Secondary | ICD-10-CM | POA: Diagnosis not present

## 2012-12-18 DIAGNOSIS — I82722 Chronic embolism and thrombosis of deep veins of left upper extremity: Secondary | ICD-10-CM

## 2012-12-18 LAB — POCT INR: INR: 2.2

## 2013-01-03 ENCOUNTER — Institutional Professional Consult (permissible substitution): Payer: Medicare Other | Admitting: Internal Medicine

## 2013-01-09 ENCOUNTER — Institutional Professional Consult (permissible substitution): Payer: Medicare Other | Admitting: Internal Medicine

## 2013-01-16 ENCOUNTER — Ambulatory Visit (INDEPENDENT_AMBULATORY_CARE_PROVIDER_SITE_OTHER): Payer: Medicare Other | Admitting: Pharmacist Clinician (PhC)/ Clinical Pharmacy Specialist

## 2013-01-16 ENCOUNTER — Encounter: Payer: Medicare Other | Admitting: Pharmacist Clinician (PhC)/ Clinical Pharmacy Specialist

## 2013-01-16 VITALS — BP 110/62 | HR 68

## 2013-01-16 DIAGNOSIS — I82729 Chronic embolism and thrombosis of deep veins of unspecified upper extremity: Secondary | ICD-10-CM

## 2013-01-16 DIAGNOSIS — Z7901 Long term (current) use of anticoagulants: Secondary | ICD-10-CM | POA: Diagnosis not present

## 2013-01-16 DIAGNOSIS — I4891 Unspecified atrial fibrillation: Secondary | ICD-10-CM

## 2013-01-16 NOTE — Progress Notes (Signed)
This encounter was created in error - please disregard.

## 2013-02-26 ENCOUNTER — Ambulatory Visit (INDEPENDENT_AMBULATORY_CARE_PROVIDER_SITE_OTHER): Payer: Medicare Other | Admitting: Pharmacist Clinician (PhC)/ Clinical Pharmacy Specialist

## 2013-02-26 VITALS — BP 120/68 | HR 72

## 2013-02-26 DIAGNOSIS — I4891 Unspecified atrial fibrillation: Secondary | ICD-10-CM | POA: Diagnosis not present

## 2013-02-26 DIAGNOSIS — Z7901 Long term (current) use of anticoagulants: Secondary | ICD-10-CM | POA: Diagnosis not present

## 2013-02-26 DIAGNOSIS — I82729 Chronic embolism and thrombosis of deep veins of unspecified upper extremity: Secondary | ICD-10-CM

## 2013-02-26 DIAGNOSIS — I82722 Chronic embolism and thrombosis of deep veins of left upper extremity: Secondary | ICD-10-CM

## 2013-03-05 DIAGNOSIS — E785 Hyperlipidemia, unspecified: Secondary | ICD-10-CM | POA: Diagnosis not present

## 2013-03-05 DIAGNOSIS — R030 Elevated blood-pressure reading, without diagnosis of hypertension: Secondary | ICD-10-CM | POA: Diagnosis not present

## 2013-03-05 DIAGNOSIS — Z7901 Long term (current) use of anticoagulants: Secondary | ICD-10-CM | POA: Diagnosis not present

## 2013-03-05 DIAGNOSIS — Z125 Encounter for screening for malignant neoplasm of prostate: Secondary | ICD-10-CM | POA: Diagnosis not present

## 2013-03-12 ENCOUNTER — Ambulatory Visit (INDEPENDENT_AMBULATORY_CARE_PROVIDER_SITE_OTHER): Payer: Medicare Other | Admitting: Pharmacist Clinician (PhC)/ Clinical Pharmacy Specialist

## 2013-03-12 VITALS — BP 126/60 | HR 72

## 2013-03-12 DIAGNOSIS — Z1331 Encounter for screening for depression: Secondary | ICD-10-CM | POA: Diagnosis not present

## 2013-03-12 DIAGNOSIS — IMO0002 Reserved for concepts with insufficient information to code with codable children: Secondary | ICD-10-CM | POA: Diagnosis not present

## 2013-03-12 DIAGNOSIS — M1990 Unspecified osteoarthritis, unspecified site: Secondary | ICD-10-CM | POA: Diagnosis not present

## 2013-03-12 DIAGNOSIS — Z6827 Body mass index (BMI) 27.0-27.9, adult: Secondary | ICD-10-CM | POA: Diagnosis not present

## 2013-03-12 DIAGNOSIS — I4891 Unspecified atrial fibrillation: Secondary | ICD-10-CM | POA: Diagnosis not present

## 2013-03-12 DIAGNOSIS — Z125 Encounter for screening for malignant neoplasm of prostate: Secondary | ICD-10-CM | POA: Diagnosis not present

## 2013-03-12 DIAGNOSIS — Z7901 Long term (current) use of anticoagulants: Secondary | ICD-10-CM | POA: Diagnosis not present

## 2013-03-12 DIAGNOSIS — I82729 Chronic embolism and thrombosis of deep veins of unspecified upper extremity: Secondary | ICD-10-CM

## 2013-03-12 DIAGNOSIS — E785 Hyperlipidemia, unspecified: Secondary | ICD-10-CM | POA: Diagnosis not present

## 2013-03-12 DIAGNOSIS — Z950 Presence of cardiac pacemaker: Secondary | ICD-10-CM | POA: Diagnosis not present

## 2013-03-12 DIAGNOSIS — I82722 Chronic embolism and thrombosis of deep veins of left upper extremity: Secondary | ICD-10-CM

## 2013-03-12 DIAGNOSIS — Z1212 Encounter for screening for malignant neoplasm of rectum: Secondary | ICD-10-CM | POA: Diagnosis not present

## 2013-03-12 DIAGNOSIS — Z0000 Encounter for general adult medical examination without abnormal findings: Secondary | ICD-10-CM | POA: Diagnosis not present

## 2013-03-14 ENCOUNTER — Encounter: Payer: Self-pay | Admitting: Nurse Practitioner

## 2013-03-19 ENCOUNTER — Ambulatory Visit: Payer: Medicare Other | Admitting: Nurse Practitioner

## 2013-03-21 ENCOUNTER — Encounter: Payer: Self-pay | Admitting: Nurse Practitioner

## 2013-03-21 ENCOUNTER — Ambulatory Visit (INDEPENDENT_AMBULATORY_CARE_PROVIDER_SITE_OTHER): Payer: Medicare Other | Admitting: Nurse Practitioner

## 2013-03-21 ENCOUNTER — Telehealth: Payer: Self-pay | Admitting: Gastroenterology

## 2013-03-21 VITALS — BP 130/80 | HR 76 | Ht 70.5 in | Wt 195.0 lb

## 2013-03-21 DIAGNOSIS — Z1211 Encounter for screening for malignant neoplasm of colon: Secondary | ICD-10-CM

## 2013-03-21 MED ORDER — MOVIPREP 100 G PO SOLR: ORAL | Status: DC

## 2013-03-21 NOTE — Patient Instructions (Signed)
You have been scheduled for a colonoscopy with propofol. Please follow written instructions given to you at your visit today.  Please pick up your prep kit at the pharmacy within the next 1-3 days. If you use inhalers (even only as needed), please bring them with you on the day of your procedure. Your physician has requested that you go to www.startemmi.com and enter the access code given to you at your visit today. This web site gives a general overview about your procedure. However, you should still follow specific instructions given to you by our office regarding your preparation for the procedure.                                                We are excited to introduce MyChart, a new best-in-class service that provides you online access to important information in your electronic medical record. We want to make it easier for you to view your health information - all in one secure location - when and where you need it. We expect MyChart will enhance the quality of care and service we provide.  When you register for MyChart, you can:    View your test results.    Request appointments and receive appointment reminders via email.    Request medication renewals.    View your medical history, allergies, medications and immunizations.    Communicate with your physician's office through a password-protected site.    Conveniently print information such as your medication lists.  To find out if MyChart is right for you, please talk to a member of our clinical staff today. We will gladly answer your questions about this free health and wellness tool.  If you are age 18 or older and want a member of your family to have access to your record, you must provide written consent by completing a proxy form available at our office. Please speak to our clinical staff about guidelines regarding accounts for patients younger than age 18.  As you activate your MyChart account and need any technical  assistance, please call the MyChart technical support line at [phone] ([phone]) or email your question to [email or web address]. If you email your question(s), please include your name, a return phone number and the best time to reach you.  If you have non-urgent health-related questions, you can send a message to our office through MyChart at [email or web address]. If you have a medical emergency, call 911.  Thank you for using MyChart as your new health and wellness resource!   MyChart licensed from [employer],  1999-2010. Patents Pending.   

## 2013-03-21 NOTE — Telephone Encounter (Signed)
  03/21/2013   RE: SHAWNTEZ DICKISON DOB: 1940/07/11 MRN: [id number]   Dear Dr. Ladona Ridgel,    We have scheduled the above patient for an endoscopic procedure. Our records show that he is on anticoagulation therapy.   Please advise as to how long the patient may come off his therapy of Coumadin prior to the procedure, which is scheduled for [facility] December.  Please fax back/ or route the completed form to Carnelia Oscar/Leslie at [phone].   Sincerely,    Adonis Housekeeper CMA Dr. Yancey Flemings

## 2013-03-21 NOTE — Progress Notes (Signed)
    HPI :  Patient is a 72 year old male known to Dr. Marina Goodell for history of adenomatous polyps in 2002. There were no polyps on his last colonoscopy in 2007.Patient received colonoscopy recall letter.   Patient has a history of atrial fibrillation. He had a pacemaker placed in January of this year. In April patient developed LUE edema and was found to have a venous thrombosis of the left subclavian vein. Collateral vein formation over LUE, shoulder and chest suggests complete subclavian. Patient is on lifelong coumadin.    Past Medical History  Diagnosis Date  . H/O cardiovascular stress test     normal, done as a baseline , ? where it was done, 4-5 yrs. ago  . Hypertension   . Arthritis   . 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 Hyperlipidemia 06/16/2012  . Chronic anticoagulation, on coumadin for PAF 06/16/2012    Family History  Problem Relation Age of Onset  . Heart disease Father    History  Substance Use Topics  . Smoking status: Never Smoker   . Smokeless tobacco: Never Used  . Alcohol Use: Yes     Comment: week- 1-2 drinks, 1-2 beers    Current Outpatient Prescriptions  Medication Sig Dispense Refill  . atenolol (TENORMIN) 50 MG tablet Take 50 mg by mouth daily.      . Calcium Citrate-Vitamin D (CALCIUM CITRATE + PO) Take 1 tablet by mouth daily.      . Multiple Vitamin (MULTIVITAMIN WITH MINERALS) TABS Take 1 tablet by mouth daily.      . Omega-3 Fatty Acids (FISH OIL) 300 MG CAPS Take 1 capsule by mouth daily.      . simvastatin (ZOCOR) 10 MG tablet Take 10 mg by mouth daily.      Marland Kitchen warfarin (COUMADIN) 2.5 MG tablet Take 1/2 to a whole tablet by mouth as directed.       No current facility-administered medications for this visit.   No Known Allergies   Review of Systems: All systems reviewed and negative except where noted in HPI.   Physical Exam: BP 130/80  Pulse 76  Ht 5' 10.5" (1.791 m)  Wt 195 lb (88.451 kg)  BMI  27.57 kg/m2 Constitutional: Pleasant,well-developed, white male in no acute distress. HEENT: Normocephalic and atraumatic. Conjunctivae are normal. No scleral icterus. Neck supple.  Cardiovascular: Normal rate, regular rhythm.  Pulmonary/chest: Effort normal and breath sounds normal. No wheezing, rales or rhonchi. Abdominal: Soft, nondistended, nontender. Bowel sounds active throughout. There are no masses palpable. No hepatomegaly. Extremities: no edema Lymphadenopathy: No cervical adenopathy noted. Neurological: Alert and oriented to person place and time. Skin: Skin is warm and dry. No rashes noted. Psychiatric: Normal mood and affect. Behavior is normal.   ASSESSMENT AND PLAN:  22. 72 year old male due for colon cancer screening. Patient is on lifelong coumadin since development of a left subclavian vein thrombus following pacemaker insertion this year. Patient has no alarm features (bowel changes, blood in stool, etc..).  Will tentatively schedule the colonoscopy for a couple of month out but I am concerned that it may be too risky to hold Coumadin. Our office will send a letter to cardiologist. In addition, patient see his cardiologist on 04/17/13 at which time he will also  discuss risks of holding coumadin.   2. History of adenomatous colon polyps 2002. No polpys on 2007 colonoscopy

## 2013-03-22 ENCOUNTER — Encounter: Payer: Self-pay | Admitting: Nurse Practitioner

## 2013-03-26 ENCOUNTER — Ambulatory Visit (INDEPENDENT_AMBULATORY_CARE_PROVIDER_SITE_OTHER): Payer: Medicare Other | Admitting: Pharmacist Clinician (PhC)/ Clinical Pharmacy Specialist

## 2013-03-26 VITALS — BP 132/80 | HR 76

## 2013-03-26 DIAGNOSIS — I82729 Chronic embolism and thrombosis of deep veins of unspecified upper extremity: Secondary | ICD-10-CM

## 2013-03-26 DIAGNOSIS — I4891 Unspecified atrial fibrillation: Secondary | ICD-10-CM

## 2013-03-26 DIAGNOSIS — I82722 Chronic embolism and thrombosis of deep veins of left upper extremity: Secondary | ICD-10-CM

## 2013-03-26 DIAGNOSIS — Z7901 Long term (current) use of anticoagulants: Secondary | ICD-10-CM | POA: Diagnosis not present

## 2013-03-26 LAB — POCT INR: INR: 2.8

## 2013-03-28 ENCOUNTER — Ambulatory Visit: Payer: Medicare Other | Admitting: Pharmacist Clinician (PhC)/ Clinical Pharmacy Specialist

## 2013-03-28 NOTE — Telephone Encounter (Signed)
Would you recommend a 5 day hold for Coumadin?

## 2013-03-28 NOTE — Telephone Encounter (Signed)
Ok to stop coumadin for procedure

## 2013-04-06 DIAGNOSIS — D235 Other benign neoplasm of skin of trunk: Secondary | ICD-10-CM | POA: Diagnosis not present

## 2013-04-06 DIAGNOSIS — L538 Other specified erythematous conditions: Secondary | ICD-10-CM | POA: Diagnosis not present

## 2013-04-06 DIAGNOSIS — L570 Actinic keratosis: Secondary | ICD-10-CM | POA: Diagnosis not present

## 2013-04-06 DIAGNOSIS — C44319 Basal cell carcinoma of skin of other parts of face: Secondary | ICD-10-CM | POA: Diagnosis not present

## 2013-04-17 ENCOUNTER — Ambulatory Visit (INDEPENDENT_AMBULATORY_CARE_PROVIDER_SITE_OTHER): Payer: Medicare Other | Admitting: Internal Medicine

## 2013-04-17 ENCOUNTER — Encounter: Payer: Self-pay | Admitting: Internal Medicine

## 2013-04-17 VITALS — BP 148/79 | HR 68 | Ht 70.5 in | Wt 197.0 lb

## 2013-04-17 DIAGNOSIS — I4891 Unspecified atrial fibrillation: Secondary | ICD-10-CM

## 2013-04-17 DIAGNOSIS — I498 Other specified cardiac arrhythmias: Secondary | ICD-10-CM

## 2013-04-17 DIAGNOSIS — Z950 Presence of cardiac pacemaker: Secondary | ICD-10-CM

## 2013-04-17 DIAGNOSIS — Z7901 Long term (current) use of anticoagulants: Secondary | ICD-10-CM | POA: Diagnosis not present

## 2013-04-17 DIAGNOSIS — R001 Bradycardia, unspecified: Secondary | ICD-10-CM

## 2013-04-17 LAB — MDC_IDC_ENUM_SESS_TYPE_INCLINIC
Battery Voltage: 3.01 V
Lead Channel Impedance Value: 589 Ohm
Lead Channel Pacing Threshold Amplitude: 0.5 V
Lead Channel Pacing Threshold Amplitude: 1.375 V
Lead Channel Sensing Intrinsic Amplitude: 6.6 mV
Lead Channel Setting Pacing Pulse Width: 0.4 ms

## 2013-04-17 NOTE — Assessment & Plan Note (Signed)
He appears to be maintaining sinus rhythm very nicely. No change in medical therapy.

## 2013-04-17 NOTE — Assessment & Plan Note (Signed)
He will continue his chronic anticoagulation. Over time,  The venous insufficiency in his left arm which improved. I've encouraged the patient to continue exercise.

## 2013-04-17 NOTE — Patient Instructions (Signed)
Your physician wants you to follow-up in: 12 months with Dr Court Joy will receive a reminder letter in the mail two months in advance. If you don't receive a letter, please call our office to schedule the follow-up appointment.   Remote monitoring is used to monitor your Pacemaker or ICD from home. This monitoring reduces the number of office visits required to check your device to one time per year. It allows [place] to keep an eye on the functioning of your device to ensure it is working properly. You are scheduled for a device check from home on 07/19/13. You may send your transmission at any time that day. If you have a wireless device, the transmission will be sent automatically. After your physician reviews your transmission, you will receive a postcard with your next transmission date.

## 2013-04-17 NOTE — Assessment & Plan Note (Signed)
His Medtronic dual-chamber pacemaker is working normally. We'll plan to recheck in several months. 

## 2013-04-17 NOTE — Progress Notes (Signed)
HPI Richard Richard [name] returns today for followup. He is a very pleasant 72 year old Richard [name] with a history of symptomatic sinus node dysfunction, status post permanent pacemaker insertion. His procedure was complicated by the development of left upper extremity swelling and documented left subclavian vein stenosis. Since I saw the patient last, his left arm swelling has improved. His only complaint today is that when he swims competitively, his left arm will begin ache, get weak, and he will have to slow down or stop what he is doing. He denies chest pain or shortness of breath and overall feels well. He is pending colonoscopy. No Known Allergies   Current Outpatient Prescriptions  Medication Sig Dispense Refill  . atenolol (TENORMIN) 50 MG tablet Take 50 mg by mouth daily.      . Calcium Citrate-Vitamin D (CALCIUM CITRATE + PO) Take 1 tablet by mouth daily.      Marland Kitchen MOVIPREP 100 G SOLR Use per prep instruction  1 kit  0  . Multiple Vitamin (MULTIVITAMIN WITH MINERALS) TABS Take 1 tablet by mouth daily.      . Omega-3 Fatty Acids (FISH OIL) 300 MG CAPS Take 1 capsule by mouth daily.      . simvastatin (ZOCOR) 10 MG tablet Take 10 mg by mouth daily.      Marland Kitchen warfarin (COUMADIN) 2.5 MG tablet Take 1/2 to a whole tablet by mouth as directed.       No current facility-administered medications for this visit.     Past Medical History  Diagnosis Date  . H/O cardiovascular stress test     normal, done as a baseline , ? where it was done, 4-5 yrs. ago  . Hypertension   . Arthritis   . 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 Hyperlipidemia 06/16/2012  . Chronic anticoagulation, on coumadin for PAF 06/16/2012    ROS:   All systems reviewed and negative except as noted in the HPI.   Past Surgical History  Procedure Laterality Date  . Colonoscopy    . Tonsillectomy    . 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 Knee arthroplasty  04/19/2012   Procedure: COMPUTER ASSISTED TOTAL KNEE ARTHROPLASTY;  Surgeon: Harvie Junior, MD;  Location: [facility] OR;  Service: Orthopedics;  Laterality: Right;  GENERAL WITH PRE OP FEMORAL NERVE BLOCK  . Insert / replace / remove pacemaker  06/15/2012    dual chamber     Family History  Problem Relation Age of Onset  . Heart disease Father      History   Social History  . Marital Status: Married    Spouse Name: N/A    Number of Children: 2  . Years of Education: N/A   Occupational History  . retired    Social History Main Topics  . Smoking status: Never Smoker   . Smokeless tobacco: Never Used  . Alcohol Use: Yes     Comment: week- 1-2 drinks, 1-2 beers   . Drug Use: No  . Sexual Activity: Not on file   Other Topics Concern  . Not on file   Social History Narrative  . No narrative on file     BP 148/79  Pulse 68  Ht 5' 10.5" (1.791 m)  Wt 197 lb (89.359 kg)  BMI 27.86 kg/m2  Physical Exam:  Well appearing 72 year old Richard [name],NAD HEENT: Unremarkable Neck:  7 cm JVD, no thyromegally Back:  No CVA tenderness  Lungs:  Clear with no wheezes, rales, or rhonchi. HEART:  Regular rate rhythm, no murmurs, no rubs, no clicks Abd:  soft, positive bowel sounds, no organomegally, no rebound, no guarding Ext:  2 plus pulses, no edema, no cyanosis, no clubbing Skin:  No rashes no nodules Neuro:  CN II through XII intact, motor grossly intact  DEVICE  Normal device function.  See PaceArt for details.   Assess/Plan:

## 2013-04-23 ENCOUNTER — Ambulatory Visit (INDEPENDENT_AMBULATORY_CARE_PROVIDER_SITE_OTHER): Payer: Medicare Other | Admitting: Pharmacist Clinician (PhC)/ Clinical Pharmacy Specialist

## 2013-04-23 VITALS — BP 118/66 | HR 80

## 2013-04-23 DIAGNOSIS — Z7901 Long term (current) use of anticoagulants: Secondary | ICD-10-CM

## 2013-04-23 DIAGNOSIS — I4891 Unspecified atrial fibrillation: Secondary | ICD-10-CM

## 2013-04-23 DIAGNOSIS — I82729 Chronic embolism and thrombosis of deep veins of unspecified upper extremity: Secondary | ICD-10-CM

## 2013-04-23 DIAGNOSIS — I82722 Chronic embolism and thrombosis of deep veins of left upper extremity: Secondary | ICD-10-CM

## 2013-04-23 LAB — POCT INR: INR: 3.6

## 2013-04-25 ENCOUNTER — Telehealth: Payer: Self-pay

## 2013-04-25 NOTE — Telephone Encounter (Signed)
Richard [name],  Would you call this patient and find out what cardiology told him about having colonoscopy.I can't tell anything from their note and he is scheduled for procedure in 6 days.  Thanks  Paula         Left message for pt to call back.  Spoke with pt and he states he was told to stop his coumadin 5 days prior to the colon, today will be his last dose.

## 2013-04-25 NOTE — Telephone Encounter (Signed)
Per pt he was told to stop his coumadin 5 days prior to his colon. Today will be his last dose.

## 2013-05-01 ENCOUNTER — Encounter: Payer: Self-pay | Admitting: Internal Medicine

## 2013-05-01 ENCOUNTER — Ambulatory Visit (AMBULATORY_SURGERY_CENTER): Payer: Medicare Other | Admitting: Internal Medicine

## 2013-05-01 VITALS — BP 117/77 | HR 60 | Temp 96.9°F | Resp 27 | Ht 70.5 in | Wt 195.0 lb

## 2013-05-01 DIAGNOSIS — I4892 Unspecified atrial flutter: Secondary | ICD-10-CM | POA: Diagnosis not present

## 2013-05-01 DIAGNOSIS — Z1211 Encounter for screening for malignant neoplasm of colon: Secondary | ICD-10-CM | POA: Diagnosis not present

## 2013-05-01 DIAGNOSIS — I10 Essential (primary) hypertension: Secondary | ICD-10-CM | POA: Diagnosis not present

## 2013-05-01 DIAGNOSIS — Z86010 Personal history of colonic polyps: Secondary | ICD-10-CM

## 2013-05-01 DIAGNOSIS — Z800 Family history of malignant neoplasm of digestive organs: Secondary | ICD-10-CM

## 2013-05-01 DIAGNOSIS — I4510 Unspecified right bundle-branch block: Secondary | ICD-10-CM | POA: Diagnosis not present

## 2013-05-01 MED ORDER — SODIUM CHLORIDE 0.9 % IV SOLN: 500.0000 mL | INTRAVENOUS | Status: DC

## 2013-05-01 NOTE — Patient Instructions (Signed)
YOU HAD AN ENDOSCOPIC PROCEDURE TODAY AT THE [facility]: Refer to the procedure report that was given to you for any specific questions about what was found during the examination.  If the procedure report does not answer your questions, please call your gastroenterologist to clarify.  If you requested that your care partner not be given the details of your procedure findings, then the procedure report has been included in a sealed envelope for you to review at your convenience later.  YOU SHOULD EXPECT: Some feelings of bloating in the abdomen. Passage of more gas than usual.  Walking can help get rid of the air that was put into your GI tract during the procedure and reduce the bloating. If you had a lower endoscopy (such as a colonoscopy or flexible sigmoidoscopy) you may notice spotting of blood in your stool or on the toilet paper. If you underwent a bowel prep for your procedure, then you may not have a normal bowel movement for a few days.  DIET: Your first meal following the procedure should be a light meal and then it is ok to progress to your normal diet.  A half-sandwich or bowl of soup is an example of a good first meal.  Heavy or fried foods are harder to digest and may make you feel nauseous or bloated.  Likewise meals heavy in dairy and vegetables can cause extra gas to form and this can also increase the bloating.  Drink plenty of fluids but you should avoid alcoholic beverages for 24 hours.  ACTIVITY: Your care partner should take you home directly after the procedure.  You should plan to take it easy, moving slowly for the rest of the day.  You can resume normal activity the day after the procedure however you should NOT DRIVE or use heavy machinery for 24 hours (because of the sedation medicines used during the test).    SYMPTOMS TO REPORT IMMEDIATELY: A gastroenterologist can be reached at any hour.  During normal business hours, 8:30 AM to 5:00 PM Monday through Friday,  call [phone].  After hours and on weekends, please call the GI answering service at [phone] who will take a message and have the physician on call contact you.   Following lower endoscopy (colonoscopy or flexible sigmoidoscopy):  Excessive amounts of blood in the stool  Significant tenderness or worsening of abdominal pains  Swelling of the abdomen that is new, acute  Fever of 100F or higher    FOLLOW UP: If any biopsies were taken you will be contacted by phone or by letter within the next 1-3 weeks.  Call your gastroenterologist if you have not heard about the biopsies in 3 weeks.  Our staff will call the home number listed on your records the next business day following your procedure to check on you and address any questions or concerns that you may have at that time regarding the information given to you following your procedure. This is a courtesy call and so if there is no answer at the home number and we have not heard from you through the emergency physician on call, we will assume that you have returned to your regular daily activities without incident.  SIGNATURES/CONFIDENTIALITY: You and/or your care partner have signed paperwork which will be entered into your electronic medical record.  These signatures attest to the fact that that the information above on your After Visit Summary has been reviewed and is understood.  Full responsibility of the confidentiality   of this discharge information lies with you and/or your care-partner.  Normal colonoscopy.  Resume coumadin today.  No follow-up colonoscopy.

## 2013-05-01 NOTE — Op Note (Signed)
[facility] [address] [place], [address]   COLONOSCOPY PROCEDURE REPORT  PATIENT: [name], Richard.  MR#: [id number] BIRTHDATE: 03/31/1941 , 72  yrs. old GENDER: Male ENDOSCOPIST: Roxy Cedar, MD REFERRED [id number] Recall, M.D. PROCEDURE DATE:  05/01/2013 PROCEDURE:   Colonoscopy, screening First Screening Colonoscopy - Avg.  risk and is 50 yrs.  old or older - No.  Prior Negative Screening - Now for repeat screening. N/A  History of Adenoma - Now for follow-up colonoscopy & has been > or = to 3 yrs.  N/A  Polyps Removed Today? No.  Recommend repeat exam, <10 yrs? No. ASA CLASS:   Class III INDICATIONS:Patient's immediate family history of colon cancer(parent 80). Prior examinations in 2002 and in 2007 (negative). MEDICATIONS: MAC sedation, administered by CRNA and propofol (Diprivan) 260mg  IV  DESCRIPTION OF PROCEDURE:   After the risks benefits and alternatives of the procedure were thoroughly explained, informed consent was obtained.  A digital rectal exam revealed no abnormalities of the rectum.   The LB [id number]  endoscope was introduced through the anus and advanced to the cecum, which was identified by both the appendix and ileocecal valve. No adverse events experienced.   The quality of the prep was good, using MoviPrep  The instrument was then slowly withdrawn as the colon was fully examined.      COLON FINDINGS: A normal appearing cecum, ileocecal valve, and appendiceal orifice were identified.  The ascending, hepatic flexure, transverse, splenic flexure, descending, sigmoid colon and rectum appeared unremarkable.  No polyps or cancers were seen. Retroflexed views revealed internal hemorrhoids. The time to cecum=3 minutes 30 seconds.  Withdrawal time=12 minutes 42 seconds. The scope was withdrawn and the procedure completed.  COMPLICATIONS: There were no complications.  ENDOSCOPIC IMPRESSION: 1. Normal  colon  RECOMMENDATIONS: 1. Return to the care of your primary provider.  GI follow up as needed   eSigned:  Roxy Cedar, MD 05/01/2013 3:52 PM   cc: Geoffry Paradise, MD and The Patient

## 2013-05-01 NOTE — Progress Notes (Signed)
15[address] CRNA relieves [employer]

## 2013-05-01 NOTE — Progress Notes (Signed)
A/ox3 pleased with MAC, report to Jane RN 

## 2013-05-01 NOTE — Progress Notes (Signed)
Patient did not experience any of the following events: a burn prior to discharge; a fall within the facility; wrong site/side/patient/procedure/implant event; or a hospital transfer or hospital admission upon discharge from the facility. (G8907) Patient did not have preoperative order for IV antibiotic SSI prophylaxis. (G8918)  

## 2013-05-02 ENCOUNTER — Telehealth: Payer: Self-pay | Admitting: *Deleted

## 2013-05-02 NOTE — Telephone Encounter (Signed)
  Follow up Call-  Call back number 05/01/2013  Post procedure Call Back phone  # [phone]  Permission to leave phone message Yes     Patient questions:  Do you have a fever, pain , or abdominal swelling? no Pain Score  0 *  Have you tolerated food without any problems? yes  Have you been able to return to your normal activities? yes  Do you have any questions about your discharge instructions: Diet   no Medications  no Follow up visit  no  Do you have questions or concerns about your Care? No  Actions: * If pain score is 4 or above: No action needed, pain <4.

## 2013-05-15 DIAGNOSIS — Z85828 Personal history of other malignant neoplasm of skin: Secondary | ICD-10-CM | POA: Diagnosis not present

## 2013-05-21 ENCOUNTER — Ambulatory Visit (INDEPENDENT_AMBULATORY_CARE_PROVIDER_SITE_OTHER): Payer: Medicare Other | Admitting: Pharmacist Clinician (PhC)/ Clinical Pharmacy Specialist

## 2013-05-21 VITALS — BP 122/68 | HR 80

## 2013-05-21 DIAGNOSIS — I4891 Unspecified atrial fibrillation: Secondary | ICD-10-CM | POA: Diagnosis not present

## 2013-05-21 DIAGNOSIS — Z7901 Long term (current) use of anticoagulants: Secondary | ICD-10-CM | POA: Diagnosis not present

## 2013-05-21 DIAGNOSIS — Z5181 Encounter for therapeutic drug level monitoring: Secondary | ICD-10-CM

## 2013-05-21 DIAGNOSIS — I82722 Chronic embolism and thrombosis of deep veins of left upper extremity: Secondary | ICD-10-CM

## 2013-05-21 LAB — POCT INR: INR: 2.8

## 2013-06-18 ENCOUNTER — Ambulatory Visit (INDEPENDENT_AMBULATORY_CARE_PROVIDER_SITE_OTHER): Payer: Medicare Other | Admitting: Pharmacist Clinician (PhC)/ Clinical Pharmacy Specialist

## 2013-06-18 VITALS — BP 120/56 | HR 72

## 2013-06-18 DIAGNOSIS — Z7901 Long term (current) use of anticoagulants: Secondary | ICD-10-CM | POA: Diagnosis not present

## 2013-06-18 DIAGNOSIS — I4891 Unspecified atrial fibrillation: Secondary | ICD-10-CM | POA: Diagnosis not present

## 2013-06-18 DIAGNOSIS — I82729 Chronic embolism and thrombosis of deep veins of unspecified upper extremity: Secondary | ICD-10-CM | POA: Diagnosis not present

## 2013-06-18 LAB — POCT INR: INR: 3

## 2013-06-19 DIAGNOSIS — J3489 Other specified disorders of nose and nasal sinuses: Secondary | ICD-10-CM | POA: Diagnosis not present

## 2013-06-19 DIAGNOSIS — Z6827 Body mass index (BMI) 27.0-27.9, adult: Secondary | ICD-10-CM | POA: Diagnosis not present

## 2013-07-16 ENCOUNTER — Ambulatory Visit (INDEPENDENT_AMBULATORY_CARE_PROVIDER_SITE_OTHER): Payer: Medicare Other | Admitting: Pharmacist Clinician (PhC)/ Clinical Pharmacy Specialist

## 2013-07-16 VITALS — BP 134/72 | HR 80

## 2013-07-16 DIAGNOSIS — Z7901 Long term (current) use of anticoagulants: Secondary | ICD-10-CM

## 2013-07-16 DIAGNOSIS — I82729 Chronic embolism and thrombosis of deep veins of unspecified upper extremity: Secondary | ICD-10-CM

## 2013-07-16 DIAGNOSIS — I4891 Unspecified atrial fibrillation: Secondary | ICD-10-CM | POA: Diagnosis not present

## 2013-07-16 LAB — POCT INR: INR: 2.1

## 2013-07-17 ENCOUNTER — Other Ambulatory Visit: Payer: Self-pay | Admitting: Cardiovascular Disease

## 2013-07-18 ENCOUNTER — Ambulatory Visit (INDEPENDENT_AMBULATORY_CARE_PROVIDER_SITE_OTHER): Payer: Medicare Other | Admitting: *Deleted

## 2013-07-18 DIAGNOSIS — I4891 Unspecified atrial fibrillation: Secondary | ICD-10-CM

## 2013-07-19 ENCOUNTER — Telehealth: Payer: Self-pay | Admitting: *Deleted

## 2013-07-19 DIAGNOSIS — I4891 Unspecified atrial fibrillation: Secondary | ICD-10-CM | POA: Diagnosis not present

## 2013-07-19 LAB — PACEMAKER DEVICE OBSERVATION

## 2013-07-19 MED ORDER — ATENOLOL 25 MG PO TABS: 25.0000 mg | ORAL_TABLET | Freq: Every day | ORAL | Status: DC

## 2013-07-19 NOTE — Telephone Encounter (Signed)
Returned call and pt verified x 2.  Pt informed message received and refill was denied r/t discrepancy in dosage.  Pt stated he is taking atenolol 25 mg daily.  Pt informed 30-day refill will be sent and he will need to schedule an appt w/ Dr. Loletha Grayer as he is due to see him next month.  Pt stated he wasn't sure if he needed to see Dr. Loletha Grayer since he is going to Dr. Tanna Furry office for his pacemaker.  Pt would like Dr. Loletha Grayer to be notified to determine if he needs to f/u in our office or with Dr. Lovena Le only.  Informed RN will discuss and call him back.  Pt verbalized understanding and agreed w/ plan.  Dr. Loletha Grayer notified and advised refill med and leave decision to pt r/t which provider to see.  Stated he and Dr. Lovena Le work together and pt can choose who he'd like to see.    Refill(s) sent to pharmacy for #30 w/ one refill.  Pt informed and of advice by Dr. Loletha Grayer.  Pt stated he would like to get cardiology care at Dr. Tanna Furry office and would also like to have INR checked in our office.  Pt informed Dr. Lovena Le, Dr. Loletha Grayer and Erasmo Downer, PharmD will be notified and advised he get future refills from Dr. Tanna Furry office for meds.  Pt informed he is able to get INR checked at the [facility] office and he stated he would like to come here b/c it's closer to home.    Message forwarded to Dr. Sallyanne Kuster, Dr. Lovena Le and Tommy Medal, PharmD.

## 2013-07-19 NOTE — Telephone Encounter (Signed)
Pt called stating that his Atenolol was denied by Dr. Claiborne Billings according to the pharmacy. He wanted to know why and if we can please fill a Rx because he is leaving for an extended period of time tomorrow.  TK

## 2013-07-22 LAB — MDC_IDC_ENUM_SESS_TYPE_REMOTE
Battery Remaining Longevity: 87 mo
Battery Voltage: 3.01 V
Brady Statistic AP VP Percent: 1.8 %
Brady Statistic AP VS Percent: 96.59 %
Brady Statistic AS VP Percent: 0.04 %
Brady Statistic AS VS Percent: 1.57 %
Brady Statistic RV Percent Paced: 1.84 %
Date Time Interrogation Session: 20150305163110
Lead Channel Impedance Value: 380 Ohm
Lead Channel Impedance Value: 551 Ohm
Lead Channel Pacing Threshold Amplitude: 0.5 V
Lead Channel Pacing Threshold Amplitude: 1.375 V
Lead Channel Pacing Threshold Pulse Width: 0.4 ms
Lead Channel Pacing Threshold Pulse Width: 0.4 ms
Lead Channel Sensing Intrinsic Amplitude: 7.375 mV
MDC IDC MSMT LEADCHNL RA IMPEDANCE VALUE: 342 Ohm
MDC IDC MSMT LEADCHNL RA SENSING INTR AMPL: 3.75 mV
MDC IDC MSMT LEADCHNL RV IMPEDANCE VALUE: 513 Ohm
MDC IDC SET LEADCHNL RA PACING AMPLITUDE: 2.75 V
MDC IDC SET LEADCHNL RV PACING AMPLITUDE: 2 V
MDC IDC SET LEADCHNL RV PACING PULSEWIDTH: 0.4 ms
MDC IDC SET LEADCHNL RV SENSING SENSITIVITY: 0.9 mV
MDC IDC SET ZONE DETECTION INTERVAL: 350 ms
MDC IDC STAT BRADY RA PERCENT PACED: 98.39 %
Zone Setting Detection Interval: 400 ms

## 2013-08-03 ENCOUNTER — Encounter: Payer: Self-pay | Admitting: *Deleted

## 2013-08-13 ENCOUNTER — Ambulatory Visit (INDEPENDENT_AMBULATORY_CARE_PROVIDER_SITE_OTHER): Payer: Medicare Other | Admitting: Pharmacist Clinician (PhC)/ Clinical Pharmacy Specialist

## 2013-08-13 DIAGNOSIS — I82729 Chronic embolism and thrombosis of deep veins of unspecified upper extremity: Secondary | ICD-10-CM

## 2013-08-13 DIAGNOSIS — I4891 Unspecified atrial fibrillation: Secondary | ICD-10-CM

## 2013-08-13 DIAGNOSIS — Z7901 Long term (current) use of anticoagulants: Secondary | ICD-10-CM | POA: Diagnosis not present

## 2013-08-13 LAB — POCT INR: INR: 2

## 2013-08-13 MED ORDER — WARFARIN SODIUM 2.5 MG PO TABS: ORAL_TABLET | ORAL | Status: DC

## 2013-08-13 MED ORDER — ATENOLOL 25 MG PO TABS: 25.0000 mg | ORAL_TABLET | Freq: Every day | ORAL | Status: DC

## 2013-08-15 ENCOUNTER — Other Ambulatory Visit: Payer: Self-pay | Admitting: Pharmacist Clinician (PhC)/ Clinical Pharmacy Specialist

## 2013-08-15 MED ORDER — ATENOLOL 25 MG PO TABS: 25.0000 mg | ORAL_TABLET | Freq: Every day | ORAL | Status: DC

## 2013-08-15 MED ORDER — WARFARIN SODIUM 2.5 MG PO TABS: ORAL_TABLET | ORAL | Status: DC

## 2013-08-15 MED ORDER — SIMVASTATIN 10 MG PO TABS: 10.0000 mg | ORAL_TABLET | Freq: Every day | ORAL | Status: DC

## 2013-08-31 ENCOUNTER — Encounter: Payer: Self-pay | Admitting: Cardiovascular Disease

## 2013-09-05 NOTE — Telephone Encounter (Signed)
Encounter has been closed--TP 09/05/13 

## 2013-09-24 ENCOUNTER — Ambulatory Visit: Payer: Medicare Other | Admitting: Pharmacist Clinician (PhC)/ Clinical Pharmacy Specialist

## 2013-09-26 ENCOUNTER — Ambulatory Visit (INDEPENDENT_AMBULATORY_CARE_PROVIDER_SITE_OTHER): Payer: Medicare Other | Admitting: Pharmacist Clinician (PhC)/ Clinical Pharmacy Specialist

## 2013-09-26 ENCOUNTER — Other Ambulatory Visit: Payer: Self-pay | Admitting: Pharmacist Clinician (PhC)/ Clinical Pharmacy Specialist

## 2013-09-26 DIAGNOSIS — I4891 Unspecified atrial fibrillation: Secondary | ICD-10-CM | POA: Diagnosis not present

## 2013-09-26 DIAGNOSIS — Z7901 Long term (current) use of anticoagulants: Secondary | ICD-10-CM | POA: Diagnosis not present

## 2013-09-26 DIAGNOSIS — I82729 Chronic embolism and thrombosis of deep veins of unspecified upper extremity: Secondary | ICD-10-CM | POA: Diagnosis not present

## 2013-09-26 LAB — POCT INR: INR: 3.1

## 2013-09-26 MED ORDER — SIMVASTATIN 40 MG PO TABS: 40.0000 mg | ORAL_TABLET | Freq: Every day | ORAL | Status: AC

## 2013-09-26 NOTE — Telephone Encounter (Signed)
Pulled paper chart, pt states simvastatin dose should be 40mg  qd, epic says 10mg .  Per paper chart, pt is on 40mg  qd.  No labs in epic to explain change in dose.  Will send correct rx to mail order pharmacy

## 2013-10-05 ENCOUNTER — Telehealth: Payer: Self-pay | Admitting: *Deleted

## 2013-10-05 NOTE — Telephone Encounter (Signed)
Faxed clarification on Simvastatin dose of 40mg  daily.

## 2013-10-22 ENCOUNTER — Ambulatory Visit (INDEPENDENT_AMBULATORY_CARE_PROVIDER_SITE_OTHER): Payer: Medicare Other | Admitting: *Deleted

## 2013-10-22 DIAGNOSIS — I498 Other specified cardiac arrhythmias: Secondary | ICD-10-CM | POA: Diagnosis not present

## 2013-10-22 DIAGNOSIS — R001 Bradycardia, unspecified: Secondary | ICD-10-CM

## 2013-10-22 NOTE — Progress Notes (Signed)
Remote pacemaker transmission.   

## 2013-10-23 ENCOUNTER — Telehealth: Payer: Self-pay | Admitting: Pharmacist Clinician (PhC)/ Clinical Pharmacy Specialist

## 2013-10-24 NOTE — Telephone Encounter (Signed)
Closed enounter °

## 2013-10-25 LAB — MDC_IDC_ENUM_SESS_TYPE_REMOTE
Battery Remaining Longevity: 82 mo
Battery Voltage: 3 V
Brady Statistic AS VS Percent: 3.31 %
Brady Statistic RV Percent Paced: 1.42 %
Date Time Interrogation Session: 20150608143058
Lead Channel Impedance Value: 342 Ohm
Lead Channel Pacing Threshold Amplitude: 0.625 V
Lead Channel Pacing Threshold Pulse Width: 0.4 ms
Lead Channel Sensing Intrinsic Amplitude: 4.875 mV
Lead Channel Sensing Intrinsic Amplitude: 4.875 mV
Lead Channel Sensing Intrinsic Amplitude: 8.75 mV
Lead Channel Sensing Intrinsic Amplitude: 8.75 mV
Lead Channel Setting Pacing Amplitude: 2 V
Lead Channel Setting Pacing Pulse Width: 0.4 ms
MDC IDC MSMT LEADCHNL RA IMPEDANCE VALUE: 399 Ohm
MDC IDC MSMT LEADCHNL RA PACING THRESHOLD AMPLITUDE: 1.5 V
MDC IDC MSMT LEADCHNL RV IMPEDANCE VALUE: 513 Ohm
MDC IDC MSMT LEADCHNL RV IMPEDANCE VALUE: 589 Ohm
MDC IDC MSMT LEADCHNL RV PACING THRESHOLD PULSEWIDTH: 0.4 ms
MDC IDC SET LEADCHNL RA PACING AMPLITUDE: 3 V
MDC IDC SET LEADCHNL RV SENSING SENSITIVITY: 0.9 mV
MDC IDC SET ZONE DETECTION INTERVAL: 400 ms
MDC IDC STAT BRADY AP VP PERCENT: 1.08 %
MDC IDC STAT BRADY AP VS PERCENT: 95.27 %
MDC IDC STAT BRADY AS VP PERCENT: 0.34 %
MDC IDC STAT BRADY RA PERCENT PACED: 96.34 %
Zone Setting Detection Interval: 350 ms

## 2013-11-05 ENCOUNTER — Ambulatory Visit: Payer: Medicare Other | Admitting: Pharmacist Clinician (PhC)/ Clinical Pharmacy Specialist

## 2013-11-05 ENCOUNTER — Ambulatory Visit (INDEPENDENT_AMBULATORY_CARE_PROVIDER_SITE_OTHER): Payer: Medicare Other | Admitting: Pharmacist Clinician (PhC)/ Clinical Pharmacy Specialist

## 2013-11-05 DIAGNOSIS — Z7901 Long term (current) use of anticoagulants: Secondary | ICD-10-CM | POA: Diagnosis not present

## 2013-11-05 DIAGNOSIS — I4891 Unspecified atrial fibrillation: Secondary | ICD-10-CM | POA: Diagnosis not present

## 2013-11-05 LAB — POCT INR: INR: 2.3

## 2013-11-13 ENCOUNTER — Encounter: Payer: Self-pay | Admitting: Cardiology

## 2013-11-20 ENCOUNTER — Telehealth: Payer: Self-pay | Admitting: Cardiovascular Disease

## 2013-11-20 MED ORDER — ATENOLOL 25 MG PO TABS
25.0000 mg | ORAL_TABLET | Freq: Every day | ORAL | Status: DC
Start: 2013-11-20 — End: 2014-05-20

## 2013-11-20 NOTE — Telephone Encounter (Signed)
Patient would like for [place] to call in 4 or 5 Atenolol tabs for him until his mail order comes in  [place] [address],

## 2013-11-20 NOTE — Telephone Encounter (Signed)
5 tablets sent into pharmacy until patient's mailorder refill arrives

## 2013-11-21 ENCOUNTER — Encounter: Payer: Self-pay | Admitting: Cardiovascular Disease

## 2013-12-04 ENCOUNTER — Encounter: Payer: Self-pay | Admitting: Cardiovascular Disease

## 2013-12-17 ENCOUNTER — Ambulatory Visit (INDEPENDENT_AMBULATORY_CARE_PROVIDER_SITE_OTHER): Payer: Medicare Other | Admitting: Pharmacist Clinician (PhC)/ Clinical Pharmacy Specialist

## 2013-12-17 DIAGNOSIS — I4891 Unspecified atrial fibrillation: Secondary | ICD-10-CM | POA: Diagnosis not present

## 2013-12-17 DIAGNOSIS — Z7901 Long term (current) use of anticoagulants: Secondary | ICD-10-CM

## 2013-12-17 LAB — POCT INR: INR: 2.5

## 2013-12-18 DIAGNOSIS — H43819 Vitreous degeneration, unspecified eye: Secondary | ICD-10-CM | POA: Diagnosis not present

## 2013-12-18 DIAGNOSIS — H2510 Age-related nuclear cataract, unspecified eye: Secondary | ICD-10-CM | POA: Diagnosis not present

## 2013-12-18 DIAGNOSIS — H25019 Cortical age-related cataract, unspecified eye: Secondary | ICD-10-CM | POA: Diagnosis not present

## 2013-12-18 DIAGNOSIS — D2310 Other benign neoplasm of skin of unspecified eyelid, including canthus: Secondary | ICD-10-CM | POA: Diagnosis not present

## 2014-01-23 ENCOUNTER — Ambulatory Visit (INDEPENDENT_AMBULATORY_CARE_PROVIDER_SITE_OTHER): Payer: Medicare Other | Admitting: *Deleted

## 2014-01-23 ENCOUNTER — Telehealth: Payer: Self-pay | Admitting: Cardiology

## 2014-01-23 DIAGNOSIS — I498 Other specified cardiac arrhythmias: Secondary | ICD-10-CM | POA: Diagnosis not present

## 2014-01-23 DIAGNOSIS — R001 Bradycardia, unspecified: Secondary | ICD-10-CM

## 2014-01-23 LAB — MDC_IDC_ENUM_SESS_TYPE_REMOTE
Battery Remaining Longevity: 75 mo
Brady Statistic AP VS Percent: 96.36 %
Brady Statistic AS VP Percent: 0.14 %
Brady Statistic AS VS Percent: 2.48 %
Brady Statistic RA Percent Paced: 97.38 %
Lead Channel Impedance Value: 342 Ohm
Lead Channel Impedance Value: 399 Ohm
Lead Channel Pacing Threshold Amplitude: 1.375 V
Lead Channel Pacing Threshold Pulse Width: 0.4 ms
Lead Channel Sensing Intrinsic Amplitude: 8.375 mV
Lead Channel Sensing Intrinsic Amplitude: 8.375 mV
Lead Channel Setting Pacing Amplitude: 2.75 V
Lead Channel Setting Pacing Pulse Width: 0.4 ms
MDC IDC MSMT BATTERY VOLTAGE: 3.01 V
MDC IDC MSMT LEADCHNL RA PACING THRESHOLD PULSEWIDTH: 0.4 ms
MDC IDC MSMT LEADCHNL RA SENSING INTR AMPL: 5 mV
MDC IDC MSMT LEADCHNL RA SENSING INTR AMPL: 5 mV
MDC IDC MSMT LEADCHNL RV IMPEDANCE VALUE: 513 Ohm
MDC IDC MSMT LEADCHNL RV IMPEDANCE VALUE: 608 Ohm
MDC IDC MSMT LEADCHNL RV PACING THRESHOLD AMPLITUDE: 0.5 V
MDC IDC SESS DTM: 20150909202039
MDC IDC SET LEADCHNL RV PACING AMPLITUDE: 2 V
MDC IDC SET LEADCHNL RV SENSING SENSITIVITY: 0.9 mV
MDC IDC SET ZONE DETECTION INTERVAL: 350 ms
MDC IDC SET ZONE DETECTION INTERVAL: 400 ms
MDC IDC STAT BRADY AP VP PERCENT: 1.02 %
MDC IDC STAT BRADY RV PERCENT PACED: 1.17 %

## 2014-01-23 NOTE — Telephone Encounter (Signed)
Confirmed remote pacemaker check with pt wife.

## 2014-01-23 NOTE — Progress Notes (Signed)
Remote pacemaker transmission.   

## 2014-01-28 ENCOUNTER — Ambulatory Visit (INDEPENDENT_AMBULATORY_CARE_PROVIDER_SITE_OTHER): Payer: Medicare Other | Admitting: Pharmacist Clinician (PhC)/ Clinical Pharmacy Specialist

## 2014-01-28 DIAGNOSIS — Z7901 Long term (current) use of anticoagulants: Secondary | ICD-10-CM | POA: Diagnosis not present

## 2014-01-28 DIAGNOSIS — I4891 Unspecified atrial fibrillation: Secondary | ICD-10-CM

## 2014-01-28 LAB — POCT INR: INR: 2.2

## 2014-02-05 ENCOUNTER — Encounter: Payer: Self-pay | Admitting: Cardiology

## 2014-02-06 ENCOUNTER — Encounter: Payer: Self-pay | Admitting: Internal Medicine

## 2014-03-04 ENCOUNTER — Ambulatory Visit (INDEPENDENT_AMBULATORY_CARE_PROVIDER_SITE_OTHER): Payer: Medicare Other | Admitting: Pharmacist Clinician (PhC)/ Clinical Pharmacy Specialist

## 2014-03-04 DIAGNOSIS — I4891 Unspecified atrial fibrillation: Secondary | ICD-10-CM | POA: Diagnosis not present

## 2014-03-04 DIAGNOSIS — Z7901 Long term (current) use of anticoagulants: Secondary | ICD-10-CM | POA: Diagnosis not present

## 2014-03-04 LAB — POCT INR: INR: 2.3

## 2014-03-07 DIAGNOSIS — Z0000 Encounter for general adult medical examination without abnormal findings: Secondary | ICD-10-CM | POA: Diagnosis not present

## 2014-03-07 DIAGNOSIS — Z125 Encounter for screening for malignant neoplasm of prostate: Secondary | ICD-10-CM | POA: Diagnosis not present

## 2014-03-07 DIAGNOSIS — E785 Hyperlipidemia, unspecified: Secondary | ICD-10-CM | POA: Diagnosis not present

## 2014-03-18 DIAGNOSIS — E785 Hyperlipidemia, unspecified: Secondary | ICD-10-CM | POA: Diagnosis not present

## 2014-03-18 DIAGNOSIS — Z125 Encounter for screening for malignant neoplasm of prostate: Secondary | ICD-10-CM | POA: Diagnosis not present

## 2014-03-18 DIAGNOSIS — Z6827 Body mass index (BMI) 27.0-27.9, adult: Secondary | ICD-10-CM | POA: Diagnosis not present

## 2014-03-18 DIAGNOSIS — Z950 Presence of cardiac pacemaker: Secondary | ICD-10-CM | POA: Diagnosis not present

## 2014-03-18 DIAGNOSIS — Z7901 Long term (current) use of anticoagulants: Secondary | ICD-10-CM | POA: Diagnosis not present

## 2014-03-18 DIAGNOSIS — M5380 Other specified dorsopathies, site unspecified: Secondary | ICD-10-CM | POA: Diagnosis not present

## 2014-03-18 DIAGNOSIS — M1990 Unspecified osteoarthritis, unspecified site: Secondary | ICD-10-CM | POA: Diagnosis not present

## 2014-03-18 DIAGNOSIS — Z0000 Encounter for general adult medical examination without abnormal findings: Secondary | ICD-10-CM | POA: Diagnosis not present

## 2014-03-27 DIAGNOSIS — Z1212 Encounter for screening for malignant neoplasm of rectum: Secondary | ICD-10-CM | POA: Diagnosis not present

## 2014-04-15 ENCOUNTER — Ambulatory Visit (INDEPENDENT_AMBULATORY_CARE_PROVIDER_SITE_OTHER): Payer: Medicare Other | Admitting: Pharmacist Clinician (PhC)/ Clinical Pharmacy Specialist

## 2014-04-15 DIAGNOSIS — I4891 Unspecified atrial fibrillation: Secondary | ICD-10-CM

## 2014-04-15 DIAGNOSIS — Z7901 Long term (current) use of anticoagulants: Secondary | ICD-10-CM | POA: Diagnosis not present

## 2014-04-15 LAB — POCT INR: INR: 2.5

## 2014-04-23 ENCOUNTER — Encounter: Payer: Medicare Other | Admitting: Internal Medicine

## 2014-04-25 ENCOUNTER — Encounter (HOSPITAL_COMMUNITY): Payer: Self-pay | Admitting: Cardiovascular Disease

## 2014-05-02 ENCOUNTER — Ambulatory Visit (INDEPENDENT_AMBULATORY_CARE_PROVIDER_SITE_OTHER): Payer: Medicare Other | Admitting: Podiatry

## 2014-05-02 ENCOUNTER — Encounter: Payer: Self-pay | Admitting: Podiatry

## 2014-05-02 VITALS — BP 152/92 | HR 80 | Resp 16

## 2014-05-02 DIAGNOSIS — B351 Tinea unguium: Secondary | ICD-10-CM | POA: Diagnosis not present

## 2014-05-02 NOTE — Patient Instructions (Signed)

## 2014-05-02 NOTE — Progress Notes (Signed)
Subjective:     Patient ID: Richard [name], male   DOB: [birth year], 73 y.o.   MRN: [id number]  HPI patient states I was concerned about my nail discoloration and whether or not it can be treated. 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

## 2014-05-11 IMAGING — CR DG CHEST 2V
2 series · 2 of 2 positions shown · non-contrast
Comparison: 04/10/2012

CLINICAL DATA: New pacemaker insertion.

CHEST - 2 VIEW

[w chest pa]
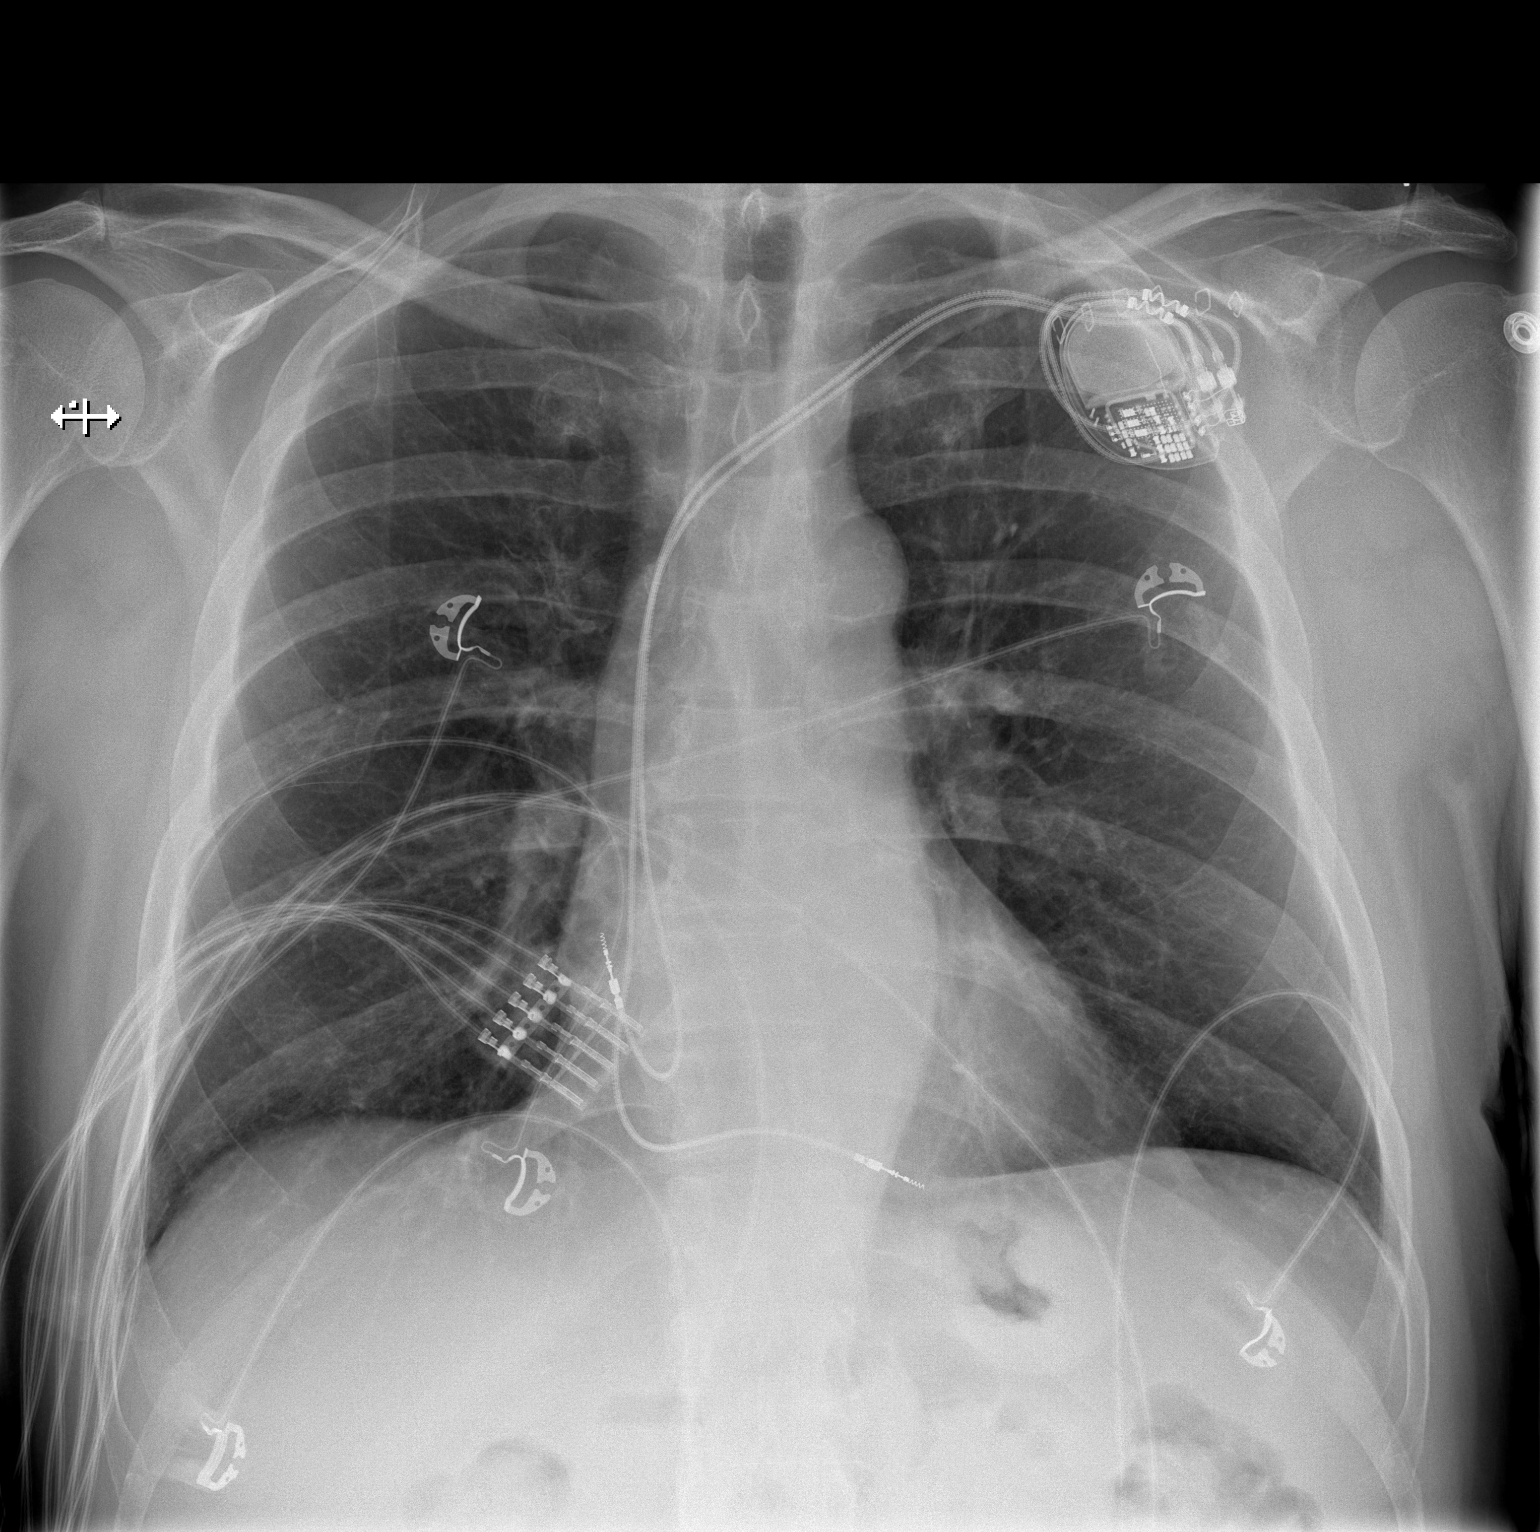

[w chest lat]
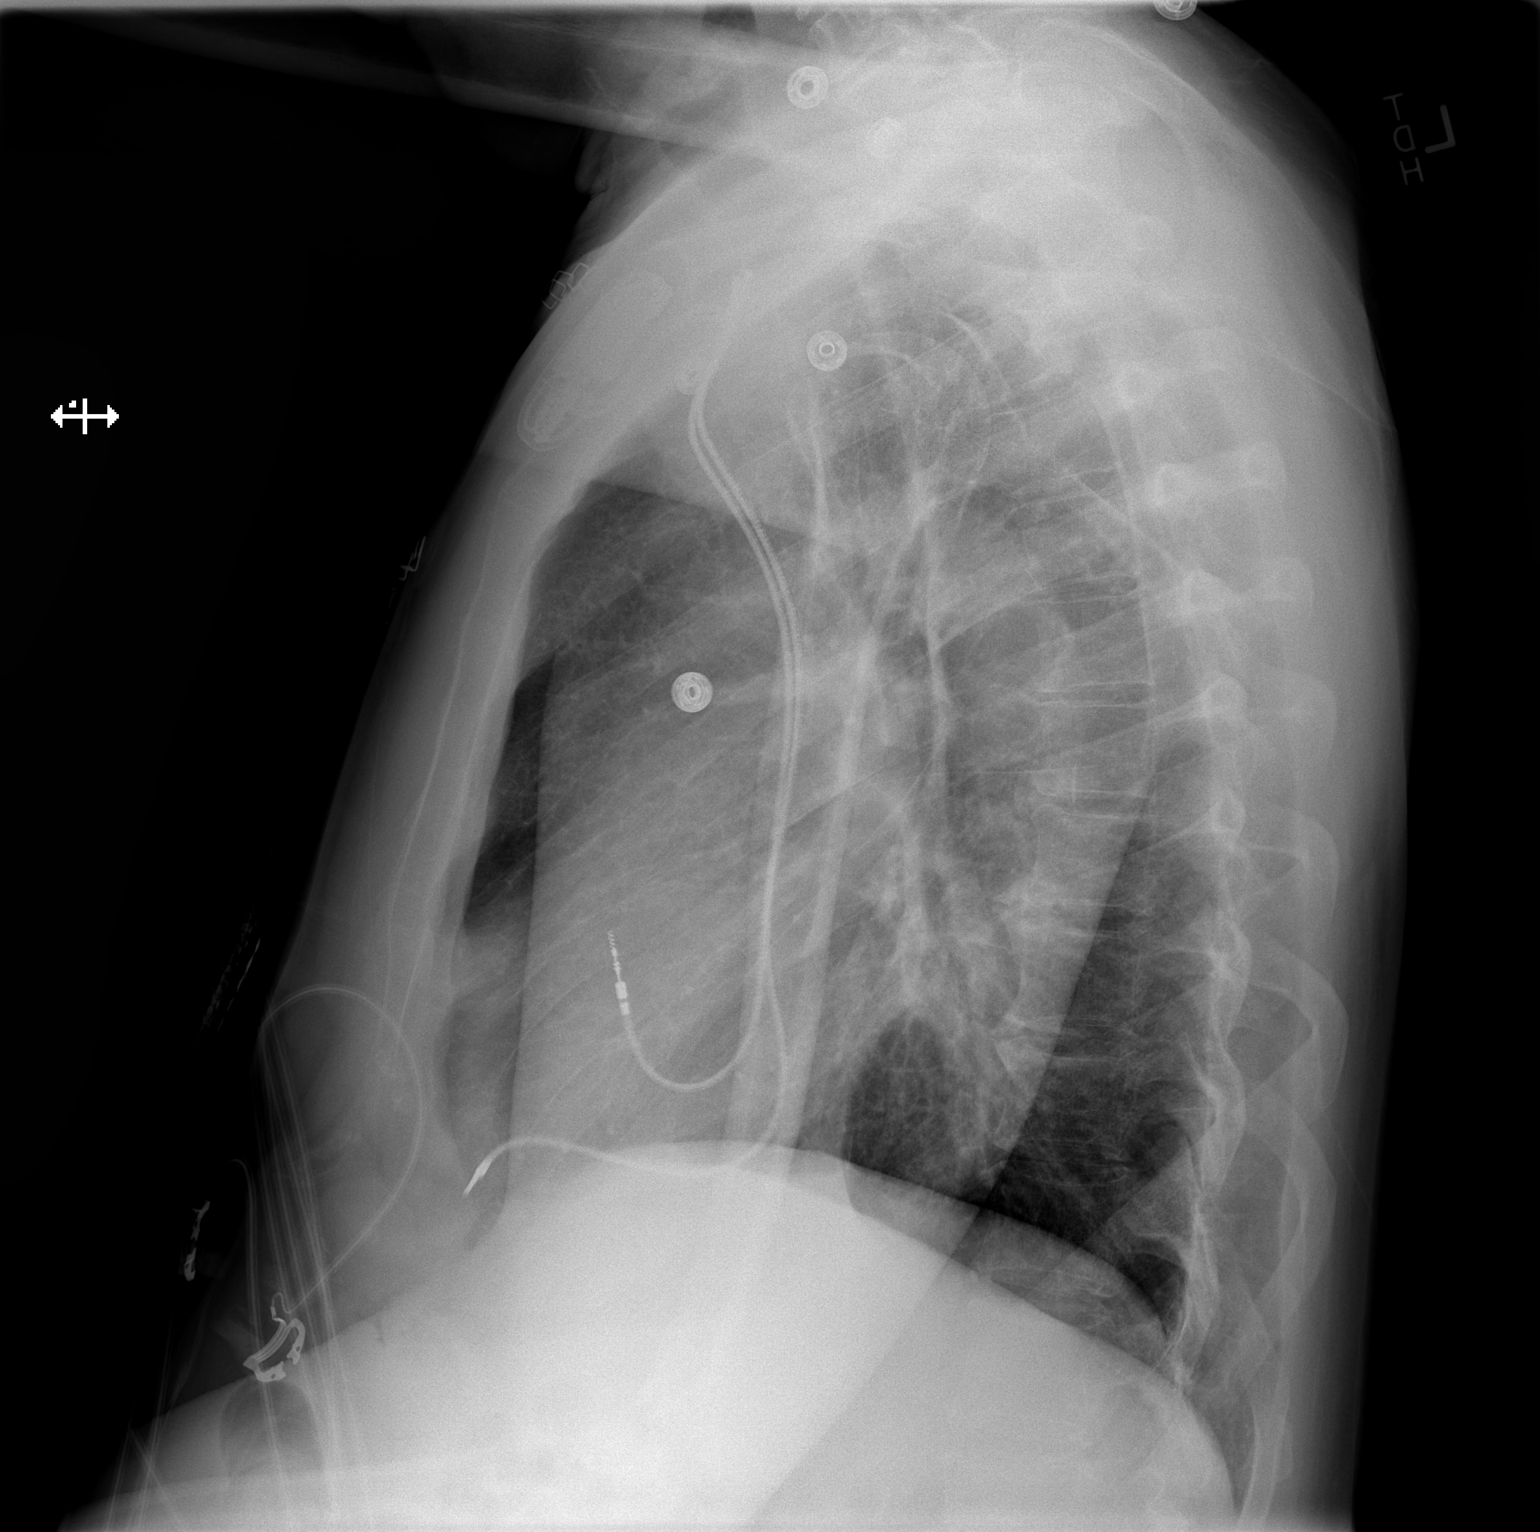

[2 of 2 positions shown; findings below may reference images not displayed]

FINDINGS: Dual lead pacer is in place.  No pneumothorax.  The heart
size and vascularity are normal.  Lungs are clear.  No osseous
abnormality.
IMPRESSION: No acute disease.  New pacemaker.

## 2014-05-14 ENCOUNTER — Ambulatory Visit (INDEPENDENT_AMBULATORY_CARE_PROVIDER_SITE_OTHER): Payer: Medicare Other | Admitting: Internal Medicine

## 2014-05-14 ENCOUNTER — Encounter: Payer: Self-pay | Admitting: Internal Medicine

## 2014-05-14 VITALS — BP 130/72 | HR 77 | Ht 70.0 in | Wt 195.0 lb

## 2014-05-14 DIAGNOSIS — Z950 Presence of cardiac pacemaker: Secondary | ICD-10-CM | POA: Diagnosis not present

## 2014-05-14 DIAGNOSIS — R001 Bradycardia, unspecified: Secondary | ICD-10-CM | POA: Diagnosis not present

## 2014-05-14 DIAGNOSIS — I82722 Chronic embolism and thrombosis of deep veins of left upper extremity: Secondary | ICD-10-CM | POA: Diagnosis not present

## 2014-05-14 DIAGNOSIS — I480 Paroxysmal atrial fibrillation: Secondary | ICD-10-CM | POA: Diagnosis not present

## 2014-05-14 DIAGNOSIS — I10 Essential (primary) hypertension: Secondary | ICD-10-CM

## 2014-05-14 LAB — MDC_IDC_ENUM_SESS_TYPE_INCLINIC
Battery Remaining Longevity: 75 mo
Battery Voltage: 3 V
Brady Statistic AP VP Percent: 0.96 %
Brady Statistic AP VS Percent: 96.82 %
Brady Statistic AS VS Percent: 2.09 %
Brady Statistic RA Percent Paced: 97.78 %
Brady Statistic RV Percent Paced: 1.09 %
Lead Channel Impedance Value: 342 Ohm
Lead Channel Impedance Value: 380 Ohm
Lead Channel Impedance Value: 513 Ohm
Lead Channel Pacing Threshold Amplitude: 0.5 V
Lead Channel Pacing Threshold Amplitude: 1.375 V
Lead Channel Pacing Threshold Pulse Width: 0.4 ms
Lead Channel Sensing Intrinsic Amplitude: 4.875 mV
Lead Channel Sensing Intrinsic Amplitude: 4.875 mV
Lead Channel Setting Sensing Sensitivity: 0.9 mV
MDC IDC MSMT LEADCHNL RA PACING THRESHOLD PULSEWIDTH: 0.4 ms
MDC IDC MSMT LEADCHNL RV IMPEDANCE VALUE: 608 Ohm
MDC IDC MSMT LEADCHNL RV SENSING INTR AMPL: 6.875 mV
MDC IDC MSMT LEADCHNL RV SENSING INTR AMPL: 7 mV
MDC IDC SESS DTM: 20151229122550
MDC IDC SET LEADCHNL RA PACING AMPLITUDE: 2.5 V
MDC IDC SET LEADCHNL RV PACING AMPLITUDE: 2.5 V
MDC IDC SET LEADCHNL RV PACING PULSEWIDTH: 0.4 ms
MDC IDC STAT BRADY AS VP PERCENT: 0.13 %
Zone Setting Detection Interval: 350 ms
Zone Setting Detection Interval: 400 ms

## 2014-05-14 NOTE — Patient Instructions (Signed)
Remote monitoring is used to monitor your pacemaker from home. This monitoring reduces the number of office visits required to check your device to one time per year. It allows [place] to keep an eye on the functioning of your device to ensure it is working properly. You are scheduled for a device check from home on 08-13-2014. You may send your transmission at any time that day. If you have a wireless device, the transmission will be sent automatically. After your physician reviews your transmission, you will receive a postcard with your next transmission date.   Your physician recommends that you schedule a follow-up appointment in: 12 months with Dr.Taylor

## 2014-05-14 NOTE — Assessment & Plan Note (Signed)
His blood pressure is well controlled. No change in meds. He is encouraged to maintain a low sodium diet. 

## 2014-05-14 NOTE — Progress Notes (Signed)
HPI Richard [name] returns today for followup. He is a very pleasant 73 year old man with a history of symptomatic sinus node dysfunction, status post permanent pacemaker insertion. His procedure was complicated by the development of left upper extremity swelling and documented left subclavian vein stenosis. Since I saw the patient last, his left arm swelling has continued to improved. His only complaint today is that when he swims competitively, his left arm will begin ache, get weak, and he will have to slow down or stop what he is doing. Over the last year this has actually improved. He denies chest pain or shortness of breath and overall feels well. No Known Allergies   Current Outpatient Prescriptions  Medication Sig Dispense Refill  . atenolol (TENORMIN) 25 MG tablet Take 1 tablet (25 mg total) by mouth daily. 5 tablet 0  . Calcium Citrate-Vitamin D (CALCIUM CITRATE + PO) Take 1 tablet by mouth daily.    . Multiple Vitamin (MULTIVITAMIN WITH MINERALS) TABS Take 1 tablet by mouth daily.    . Omega-3 Fatty Acids (FISH OIL) 300 MG CAPS Take 1 capsule by mouth daily.    . simvastatin (ZOCOR) 40 MG tablet Take 1 tablet (40 mg total) by mouth daily. 90 tablet 2  . warfarin (COUMADIN) 2.5 MG tablet Take 1 tablet by mouth daily or as directed 90 tablet 2   No current facility-administered medications for this visit.     Past Medical History  Diagnosis Date  . H/O cardiovascular stress test     normal, done as a baseline , ? where it was done, 4-5 yrs. ago  . Hypertension   . 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 Hyperlipidemia 06/16/2012  . Chronic anticoagulation, on coumadin for PAF 06/16/2012  . Arthritis     knee- has been replaced so no longer a problem  . Clotting disorder     hx of DVT    ROS:   All systems reviewed and negative except as noted in the HPI.   Past Surgical History  Procedure Laterality Date  . Colonoscopy    .  Tonsillectomy    . 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 Knee arthroplasty  04/19/2012    Procedure: COMPUTER ASSISTED TOTAL KNEE ARTHROPLASTY;  Surgeon: Alta Corning, MD;  Location: [facility];  Service: Orthopedics;  Laterality: Right;  GENERAL WITH PRE OP FEMORAL NERVE BLOCK  . Insert / replace / remove pacemaker  06/15/2012    dual chamber  . Permanent pacemaker insertion N/A 06/15/2012    Procedure: PERMANENT PACEMAKER INSERTION;  Surgeon: Sanda Klein, MD;  Location: [facility];  Service: Cardiovascular;  Laterality: N/A;     Family History  Problem Relation Age of Onset  . Heart disease Father   . Colon cancer Neg Hx   . Esophageal cancer Neg Hx   . Rectal cancer Neg Hx   . Stomach cancer Neg Hx      History   Social History  . Marital Status: Married    Spouse Name: N/A    Number of Children: 2  . Years of Education: N/A   Occupational History  . retired    Social History Main Topics  . Smoking status: Never Smoker   . Smokeless tobacco: Never Used  . Alcohol Use: Yes     Comment: week- 1-2 drinks, 1-2 beers   . Drug Use: No  . Sexual Activity: Not  on file   Other Topics Concern  . Not on file   Social History Narrative     BP 130/72 mmHg  Pulse 77  Ht 5\' 10"  (1.778 m)  Wt 195 lb (88.451 kg)  BMI 27.98 kg/m2  Physical Exam:  Well appearing 73 year old man,NAD HEENT: Unremarkable Neck:  7 cm JVD, no thyromegally Back:  No CVA tenderness Lungs:  Clear with no wheezes, rales, or rhonchi. HEART:  Regular rate rhythm, no murmurs, no rubs, no clicks Abd:  soft, positive bowel sounds, no organomegally, no rebound, no guarding Ext:  2 plus pulses, left arm with mild peripheral edema, no cyanosis, no clubbing Skin:  No rashes no nodules Neuro:  CN II through XII intact, motor grossly intact  DEVICE  Normal device function.  See PaceArt for details.   Assess/Plan:

## 2014-05-14 NOTE — Assessment & Plan Note (Signed)
His arm is mildly swollen. He is encouraged to keep it elevated.

## 2014-05-14 NOTE — Assessment & Plan Note (Signed)
He continues to have brief episodes which are for the most part asymptomatic. He will continue his current meds. We discussed the possibility of switching to a NOAC but for now he prefers to continue taking coumadin.

## 2014-05-14 NOTE — Assessment & Plan Note (Signed)
His medtronic DDD PM is working normally. Will recheck in several months. 

## 2014-05-20 ENCOUNTER — Telehealth: Payer: Self-pay | Admitting: Cardiovascular Disease

## 2014-05-20 MED ORDER — ATENOLOL 25 MG PO TABS: 25.0000 mg | ORAL_TABLET | Freq: Every day | ORAL | Status: DC

## 2014-05-20 MED ORDER — ATENOLOL 25 MG PO TABS
25.0000 mg | ORAL_TABLET | Freq: Every day | ORAL | Status: DC
Start: 2014-05-20 — End: 2014-08-20

## 2014-05-20 NOTE — Telephone Encounter (Signed)
Pt called in stating that he has 3 days left on his Atenolol and would like a partial called in the [facility] [address] and a the rest called in to [facility]. Thanks  Thanks

## 2014-05-20 NOTE — Telephone Encounter (Signed)
Rx(s) sent to pharmacy electronically to local and mail order pharmacies.  

## 2014-05-27 ENCOUNTER — Ambulatory Visit (INDEPENDENT_AMBULATORY_CARE_PROVIDER_SITE_OTHER): Payer: Medicare Other | Admitting: Pharmacist Clinician (PhC)/ Clinical Pharmacy Specialist

## 2014-05-27 DIAGNOSIS — I4891 Unspecified atrial fibrillation: Secondary | ICD-10-CM | POA: Diagnosis not present

## 2014-05-27 DIAGNOSIS — Z7901 Long term (current) use of anticoagulants: Secondary | ICD-10-CM

## 2014-05-27 DIAGNOSIS — I82722 Chronic embolism and thrombosis of deep veins of left upper extremity: Secondary | ICD-10-CM

## 2014-05-27 DIAGNOSIS — I480 Paroxysmal atrial fibrillation: Secondary | ICD-10-CM | POA: Diagnosis not present

## 2014-05-27 LAB — POCT INR: INR: 2.7

## 2014-07-08 ENCOUNTER — Ambulatory Visit (INDEPENDENT_AMBULATORY_CARE_PROVIDER_SITE_OTHER): Payer: Medicare Other | Admitting: Pharmacist Clinician (PhC)/ Clinical Pharmacy Specialist

## 2014-07-08 DIAGNOSIS — Z7901 Long term (current) use of anticoagulants: Secondary | ICD-10-CM | POA: Diagnosis not present

## 2014-07-08 DIAGNOSIS — I4891 Unspecified atrial fibrillation: Secondary | ICD-10-CM

## 2014-07-08 DIAGNOSIS — I480 Paroxysmal atrial fibrillation: Secondary | ICD-10-CM | POA: Diagnosis not present

## 2014-07-08 DIAGNOSIS — I82722 Chronic embolism and thrombosis of deep veins of left upper extremity: Secondary | ICD-10-CM

## 2014-07-08 LAB — POCT INR: INR: 3

## 2014-07-23 DIAGNOSIS — M25561 Pain in right knee: Secondary | ICD-10-CM | POA: Diagnosis not present

## 2014-07-23 DIAGNOSIS — M79673 Pain in unspecified foot: Secondary | ICD-10-CM

## 2014-08-13 ENCOUNTER — Ambulatory Visit (INDEPENDENT_AMBULATORY_CARE_PROVIDER_SITE_OTHER): Payer: Medicare Other | Admitting: *Deleted

## 2014-08-13 ENCOUNTER — Encounter: Payer: Self-pay | Admitting: Cardiovascular Disease

## 2014-08-13 DIAGNOSIS — I480 Paroxysmal atrial fibrillation: Secondary | ICD-10-CM | POA: Diagnosis not present

## 2014-08-13 DIAGNOSIS — R001 Bradycardia, unspecified: Secondary | ICD-10-CM

## 2014-08-13 LAB — MDC_IDC_ENUM_SESS_TYPE_REMOTE
Battery Voltage: 3 V
Brady Statistic AP VP Percent: 0.22 %
Brady Statistic AS VP Percent: 0 %
Brady Statistic AS VS Percent: 1.01 %
Brady Statistic RA Percent Paced: 98.99 %
Brady Statistic RV Percent Paced: 0.23 %
Date Time Interrogation Session: 20160329131856
Lead Channel Impedance Value: 342 Ohm
Lead Channel Impedance Value: 380 Ohm
Lead Channel Impedance Value: 608 Ohm
Lead Channel Pacing Threshold Pulse Width: 0.4 ms
Lead Channel Pacing Threshold Pulse Width: 0.4 ms
Lead Channel Sensing Intrinsic Amplitude: 4 mV
Lead Channel Setting Pacing Amplitude: 2.5 V
Lead Channel Setting Pacing Amplitude: 3 V
Lead Channel Setting Pacing Pulse Width: 0.4 ms
MDC IDC MSMT BATTERY REMAINING LONGEVITY: 77 mo
MDC IDC MSMT LEADCHNL RA PACING THRESHOLD AMPLITUDE: 1.5 V
MDC IDC MSMT LEADCHNL RV IMPEDANCE VALUE: 494 Ohm
MDC IDC MSMT LEADCHNL RV PACING THRESHOLD AMPLITUDE: 0.625 V
MDC IDC MSMT LEADCHNL RV SENSING INTR AMPL: 6.875 mV
MDC IDC SET LEADCHNL RV SENSING SENSITIVITY: 0.9 mV
MDC IDC STAT BRADY AP VS PERCENT: 98.76 %
Zone Setting Detection Interval: 350 ms
Zone Setting Detection Interval: 400 ms

## 2014-08-13 NOTE — Progress Notes (Signed)
Remote pacemaker transmission.   

## 2014-08-16 ENCOUNTER — Encounter: Payer: Self-pay | Admitting: Cardiology

## 2014-08-19 ENCOUNTER — Ambulatory Visit (INDEPENDENT_AMBULATORY_CARE_PROVIDER_SITE_OTHER): Payer: Medicare Other | Admitting: Pharmacist Clinician (PhC)/ Clinical Pharmacy Specialist

## 2014-08-19 DIAGNOSIS — I4891 Unspecified atrial fibrillation: Secondary | ICD-10-CM

## 2014-08-19 DIAGNOSIS — I82722 Chronic embolism and thrombosis of deep veins of left upper extremity: Secondary | ICD-10-CM | POA: Diagnosis not present

## 2014-08-19 DIAGNOSIS — I480 Paroxysmal atrial fibrillation: Secondary | ICD-10-CM

## 2014-08-19 DIAGNOSIS — Z7901 Long term (current) use of anticoagulants: Secondary | ICD-10-CM

## 2014-08-19 LAB — POCT INR: INR: 2.8

## 2014-08-20 ENCOUNTER — Other Ambulatory Visit: Payer: Self-pay

## 2014-08-20 MED ORDER — ATENOLOL 25 MG PO TABS: 25.0000 mg | ORAL_TABLET | Freq: Every day | ORAL | Status: DC

## 2014-08-20 NOTE — Telephone Encounter (Signed)
Rx(s) sent to pharmacy electronically.  

## 2014-08-22 MED ORDER — WARFARIN SODIUM 2.5 MG PO TABS: ORAL_TABLET | ORAL | Status: DC

## 2014-09-30 ENCOUNTER — Ambulatory Visit (INDEPENDENT_AMBULATORY_CARE_PROVIDER_SITE_OTHER): Payer: Medicare Other | Admitting: Pharmacist Clinician (PhC)/ Clinical Pharmacy Specialist

## 2014-09-30 DIAGNOSIS — Z7901 Long term (current) use of anticoagulants: Secondary | ICD-10-CM

## 2014-09-30 DIAGNOSIS — I480 Paroxysmal atrial fibrillation: Secondary | ICD-10-CM

## 2014-09-30 DIAGNOSIS — I4891 Unspecified atrial fibrillation: Secondary | ICD-10-CM | POA: Diagnosis not present

## 2014-09-30 DIAGNOSIS — I82722 Chronic embolism and thrombosis of deep veins of left upper extremity: Secondary | ICD-10-CM | POA: Diagnosis not present

## 2014-09-30 LAB — POCT INR: INR: 2.6

## 2014-10-11 ENCOUNTER — Telehealth: Payer: Self-pay | Admitting: Pharmacist Clinician (PhC)/ Clinical Pharmacy Specialist

## 2014-10-11 NOTE — Telephone Encounter (Signed)
Closed encounter °

## 2014-11-05 ENCOUNTER — Ambulatory Visit (INDEPENDENT_AMBULATORY_CARE_PROVIDER_SITE_OTHER): Payer: Medicare Other | Admitting: Pharmacist

## 2014-11-05 DIAGNOSIS — I4891 Unspecified atrial fibrillation: Secondary | ICD-10-CM

## 2014-11-05 DIAGNOSIS — I480 Paroxysmal atrial fibrillation: Secondary | ICD-10-CM

## 2014-11-05 DIAGNOSIS — Z7901 Long term (current) use of anticoagulants: Secondary | ICD-10-CM

## 2014-11-05 DIAGNOSIS — I82722 Chronic embolism and thrombosis of deep veins of left upper extremity: Secondary | ICD-10-CM | POA: Diagnosis not present

## 2014-11-05 LAB — POCT INR: INR: 2.6

## 2014-11-12 ENCOUNTER — Encounter: Payer: Self-pay | Admitting: Internal Medicine

## 2014-11-12 ENCOUNTER — Ambulatory Visit (INDEPENDENT_AMBULATORY_CARE_PROVIDER_SITE_OTHER): Payer: Medicare Other | Admitting: *Deleted

## 2014-11-12 DIAGNOSIS — I480 Paroxysmal atrial fibrillation: Secondary | ICD-10-CM | POA: Diagnosis not present

## 2014-11-12 NOTE — Progress Notes (Signed)
Remote pacemaker transmission.   

## 2014-11-14 LAB — CUP PACEART REMOTE DEVICE CHECK
Battery Remaining Longevity: 69 mo
Battery Voltage: 3 V
Brady Statistic AP VP Percent: 0.08 %
Brady Statistic AP VS Percent: 98.74 %
Brady Statistic AS VP Percent: 0 %
Brady Statistic RA Percent Paced: 98.82 %
Date Time Interrogation Session: 20160628140144
Lead Channel Impedance Value: 380 Ohm
Lead Channel Impedance Value: 513 Ohm
Lead Channel Impedance Value: 627 Ohm
Lead Channel Pacing Threshold Pulse Width: 0.4 ms
Lead Channel Sensing Intrinsic Amplitude: 3.75 mV
Lead Channel Sensing Intrinsic Amplitude: 3.75 mV
Lead Channel Sensing Intrinsic Amplitude: 6.875 mV
Lead Channel Sensing Intrinsic Amplitude: 6.875 mV
Lead Channel Setting Pacing Amplitude: 2.5 V
Lead Channel Setting Sensing Sensitivity: 0.9 mV
MDC IDC MSMT LEADCHNL RA IMPEDANCE VALUE: 323 Ohm
MDC IDC MSMT LEADCHNL RA PACING THRESHOLD AMPLITUDE: 1.5 V
MDC IDC MSMT LEADCHNL RA PACING THRESHOLD PULSEWIDTH: 0.4 ms
MDC IDC MSMT LEADCHNL RV PACING THRESHOLD AMPLITUDE: 0.625 V
MDC IDC SET LEADCHNL RA PACING AMPLITUDE: 3.25 V
MDC IDC SET LEADCHNL RV PACING PULSEWIDTH: 0.4 ms
MDC IDC STAT BRADY AS VS PERCENT: 1.18 %
MDC IDC STAT BRADY RV PERCENT PACED: 0.08 %
Zone Setting Detection Interval: 350 ms
Zone Setting Detection Interval: 400 ms

## 2014-11-21 ENCOUNTER — Other Ambulatory Visit: Payer: Self-pay | Admitting: *Deleted

## 2014-11-21 MED ORDER — ATENOLOL 25 MG PO TABS: 25.0000 mg | ORAL_TABLET | Freq: Every day | ORAL | Status: DC

## 2014-12-11 ENCOUNTER — Encounter: Payer: Self-pay | Admitting: *Deleted

## 2014-12-16 ENCOUNTER — Ambulatory Visit (INDEPENDENT_AMBULATORY_CARE_PROVIDER_SITE_OTHER): Payer: Medicare Other | Admitting: Pharmacist Clinician (PhC)/ Clinical Pharmacy Specialist

## 2014-12-16 DIAGNOSIS — I480 Paroxysmal atrial fibrillation: Secondary | ICD-10-CM | POA: Diagnosis not present

## 2014-12-16 DIAGNOSIS — I82722 Chronic embolism and thrombosis of deep veins of left upper extremity: Secondary | ICD-10-CM | POA: Diagnosis not present

## 2014-12-16 DIAGNOSIS — Z7901 Long term (current) use of anticoagulants: Secondary | ICD-10-CM

## 2014-12-16 DIAGNOSIS — I4891 Unspecified atrial fibrillation: Secondary | ICD-10-CM

## 2014-12-16 LAB — POCT INR: INR: 3.1

## 2014-12-23 DIAGNOSIS — H524 Presbyopia: Secondary | ICD-10-CM | POA: Diagnosis not present

## 2014-12-23 DIAGNOSIS — D2311 Other benign neoplasm of skin of right eyelid, including canthus: Secondary | ICD-10-CM | POA: Diagnosis not present

## 2014-12-23 DIAGNOSIS — H25013 Cortical age-related cataract, bilateral: Secondary | ICD-10-CM | POA: Diagnosis not present

## 2014-12-23 DIAGNOSIS — H2513 Age-related nuclear cataract, bilateral: Secondary | ICD-10-CM | POA: Diagnosis not present

## 2015-01-10 ENCOUNTER — Encounter: Payer: Self-pay | Admitting: Cardiovascular Disease

## 2015-01-27 ENCOUNTER — Ambulatory Visit (INDEPENDENT_AMBULATORY_CARE_PROVIDER_SITE_OTHER): Payer: Medicare Other | Admitting: Pharmacist Clinician (PhC)/ Clinical Pharmacy Specialist

## 2015-01-27 DIAGNOSIS — I4891 Unspecified atrial fibrillation: Secondary | ICD-10-CM

## 2015-01-27 DIAGNOSIS — Z7901 Long term (current) use of anticoagulants: Secondary | ICD-10-CM

## 2015-01-27 DIAGNOSIS — I480 Paroxysmal atrial fibrillation: Secondary | ICD-10-CM | POA: Diagnosis not present

## 2015-01-27 DIAGNOSIS — I82722 Chronic embolism and thrombosis of deep veins of left upper extremity: Secondary | ICD-10-CM | POA: Diagnosis not present

## 2015-01-27 LAB — POCT INR: INR: 2.4

## 2015-02-11 ENCOUNTER — Encounter: Payer: Self-pay | Admitting: Internal Medicine

## 2015-02-11 ENCOUNTER — Ambulatory Visit (INDEPENDENT_AMBULATORY_CARE_PROVIDER_SITE_OTHER): Payer: Medicare Other | Admitting: *Deleted

## 2015-02-11 DIAGNOSIS — R001 Bradycardia, unspecified: Secondary | ICD-10-CM | POA: Diagnosis not present

## 2015-02-11 NOTE — Progress Notes (Signed)
Remote pacemaker transmission.   

## 2015-02-12 ENCOUNTER — Telehealth: Payer: Self-pay | Admitting: *Deleted

## 2015-02-12 DIAGNOSIS — B351 Tinea unguium: Secondary | ICD-10-CM

## 2015-02-12 NOTE — Telephone Encounter (Signed)
Pt left name, phone number and time.  I called pt he stated he had taken care of his question.

## 2015-02-17 LAB — CUP PACEART REMOTE DEVICE CHECK
Battery Remaining Longevity: 66 mo
Brady Statistic AP VP Percent: 0.33 %
Brady Statistic AP VS Percent: 98.82 %
Brady Statistic AS VS Percent: 0.85 %
Date Time Interrogation Session: 20160927150141
Lead Channel Impedance Value: 342 Ohm
Lead Channel Impedance Value: 399 Ohm
Lead Channel Pacing Threshold Amplitude: 0.5 V
Lead Channel Pacing Threshold Amplitude: 1.375 V
Lead Channel Pacing Threshold Pulse Width: 0.4 ms
Lead Channel Sensing Intrinsic Amplitude: 4.75 mV
Lead Channel Sensing Intrinsic Amplitude: 4.75 mV
Lead Channel Sensing Intrinsic Amplitude: 8.5 mV
MDC IDC MSMT BATTERY VOLTAGE: 3 V
MDC IDC MSMT LEADCHNL RA PACING THRESHOLD PULSEWIDTH: 0.4 ms
MDC IDC MSMT LEADCHNL RV IMPEDANCE VALUE: 532 Ohm
MDC IDC MSMT LEADCHNL RV IMPEDANCE VALUE: 646 Ohm
MDC IDC MSMT LEADCHNL RV SENSING INTR AMPL: 8.5 mV
MDC IDC SET LEADCHNL RA PACING AMPLITUDE: 2.75 V
MDC IDC SET LEADCHNL RV PACING AMPLITUDE: 2.5 V
MDC IDC SET LEADCHNL RV PACING PULSEWIDTH: 0.4 ms
MDC IDC SET LEADCHNL RV SENSING SENSITIVITY: 0.9 mV
MDC IDC SET ZONE DETECTION INTERVAL: 350 ms
MDC IDC SET ZONE DETECTION INTERVAL: 400 ms
MDC IDC STAT BRADY AS VP PERCENT: 0 %
MDC IDC STAT BRADY RA PERCENT PACED: 99.15 %
MDC IDC STAT BRADY RV PERCENT PACED: 0.33 %

## 2015-02-25 ENCOUNTER — Other Ambulatory Visit: Payer: Self-pay | Admitting: Cardiovascular Disease

## 2015-02-25 NOTE — Telephone Encounter (Signed)
REFILL 

## 2015-03-03 ENCOUNTER — Telehealth: Payer: Self-pay | Admitting: Internal Medicine

## 2015-03-03 NOTE — Telephone Encounter (Signed)
°  STAT if patient is at the pharmacy , call can be transferred to refill team.   1. Which medications need to be refilled? Atenolol  2. Which pharmacy/location is medication to be sent to?[facility] on [address]  3. Do they need a 30 day or 90 day supply? A partial , he is waiting on his mail order

## 2015-03-04 ENCOUNTER — Other Ambulatory Visit: Payer: Self-pay

## 2015-03-04 MED ORDER — ATENOLOL 25 MG PO TABS: 25.0000 mg | ORAL_TABLET | Freq: Every day | ORAL | Status: DC

## 2015-03-04 NOTE — Telephone Encounter (Signed)
A 30 day supply sent to [facility]

## 2015-03-10 ENCOUNTER — Ambulatory Visit (INDEPENDENT_AMBULATORY_CARE_PROVIDER_SITE_OTHER): Payer: Medicare Other | Admitting: Pharmacist Clinician (PhC)/ Clinical Pharmacy Specialist

## 2015-03-10 DIAGNOSIS — I4891 Unspecified atrial fibrillation: Secondary | ICD-10-CM | POA: Diagnosis not present

## 2015-03-10 DIAGNOSIS — I480 Paroxysmal atrial fibrillation: Secondary | ICD-10-CM | POA: Diagnosis not present

## 2015-03-10 DIAGNOSIS — Z7901 Long term (current) use of anticoagulants: Secondary | ICD-10-CM | POA: Diagnosis not present

## 2015-03-10 DIAGNOSIS — I82722 Chronic embolism and thrombosis of deep veins of left upper extremity: Secondary | ICD-10-CM | POA: Diagnosis not present

## 2015-03-10 LAB — POCT INR: INR: 2.2

## 2015-03-14 ENCOUNTER — Encounter: Payer: Self-pay | Admitting: Cardiology

## 2015-03-17 DIAGNOSIS — Z125 Encounter for screening for malignant neoplasm of prostate: Secondary | ICD-10-CM | POA: Diagnosis not present

## 2015-03-17 DIAGNOSIS — Z79899 Other long term (current) drug therapy: Secondary | ICD-10-CM | POA: Diagnosis not present

## 2015-03-17 DIAGNOSIS — E785 Hyperlipidemia, unspecified: Secondary | ICD-10-CM | POA: Diagnosis not present

## 2015-03-24 DIAGNOSIS — E785 Hyperlipidemia, unspecified: Secondary | ICD-10-CM | POA: Diagnosis not present

## 2015-03-24 DIAGNOSIS — Z23 Encounter for immunization: Secondary | ICD-10-CM | POA: Diagnosis not present

## 2015-03-24 DIAGNOSIS — M5380 Other specified dorsopathies, site unspecified: Secondary | ICD-10-CM | POA: Diagnosis not present

## 2015-03-24 DIAGNOSIS — I4891 Unspecified atrial fibrillation: Secondary | ICD-10-CM | POA: Diagnosis not present

## 2015-03-24 DIAGNOSIS — M1990 Unspecified osteoarthritis, unspecified site: Secondary | ICD-10-CM | POA: Diagnosis not present

## 2015-03-24 DIAGNOSIS — Z1389 Encounter for screening for other disorder: Secondary | ICD-10-CM | POA: Diagnosis not present

## 2015-03-24 DIAGNOSIS — I808 Phlebitis and thrombophlebitis of other sites: Secondary | ICD-10-CM | POA: Diagnosis not present

## 2015-03-24 DIAGNOSIS — Z0000 Encounter for general adult medical examination without abnormal findings: Secondary | ICD-10-CM | POA: Diagnosis not present

## 2015-03-24 DIAGNOSIS — Z950 Presence of cardiac pacemaker: Secondary | ICD-10-CM | POA: Diagnosis not present

## 2015-03-24 DIAGNOSIS — Z7901 Long term (current) use of anticoagulants: Secondary | ICD-10-CM | POA: Diagnosis not present

## 2015-03-24 DIAGNOSIS — Z6827 Body mass index (BMI) 27.0-27.9, adult: Secondary | ICD-10-CM | POA: Diagnosis not present

## 2015-04-09 DIAGNOSIS — Z1212 Encounter for screening for malignant neoplasm of rectum: Secondary | ICD-10-CM | POA: Diagnosis not present

## 2015-04-21 ENCOUNTER — Ambulatory Visit (INDEPENDENT_AMBULATORY_CARE_PROVIDER_SITE_OTHER): Payer: Medicare Other | Admitting: Pharmacist Clinician (PhC)/ Clinical Pharmacy Specialist

## 2015-04-21 DIAGNOSIS — I480 Paroxysmal atrial fibrillation: Secondary | ICD-10-CM | POA: Diagnosis not present

## 2015-04-21 DIAGNOSIS — I82722 Chronic embolism and thrombosis of deep veins of left upper extremity: Secondary | ICD-10-CM

## 2015-04-21 DIAGNOSIS — Z7901 Long term (current) use of anticoagulants: Secondary | ICD-10-CM | POA: Diagnosis not present

## 2015-04-21 DIAGNOSIS — I4891 Unspecified atrial fibrillation: Secondary | ICD-10-CM

## 2015-04-21 LAB — POCT INR: INR: 2.8

## 2015-05-07 ENCOUNTER — Other Ambulatory Visit: Payer: Self-pay | Admitting: Pharmacist Clinician (PhC)/ Clinical Pharmacy Specialist

## 2015-05-07 MED ORDER — WARFARIN SODIUM 2.5 MG PO TABS: ORAL_TABLET | ORAL | Status: DC

## 2015-05-22 ENCOUNTER — Encounter: Payer: Medicare Other | Admitting: Internal Medicine

## 2015-06-02 ENCOUNTER — Ambulatory Visit (INDEPENDENT_AMBULATORY_CARE_PROVIDER_SITE_OTHER): Payer: Medicare Other | Admitting: Pharmacist Clinician (PhC)/ Clinical Pharmacy Specialist

## 2015-06-02 DIAGNOSIS — I480 Paroxysmal atrial fibrillation: Secondary | ICD-10-CM

## 2015-06-02 DIAGNOSIS — Z7901 Long term (current) use of anticoagulants: Secondary | ICD-10-CM | POA: Diagnosis not present

## 2015-06-02 DIAGNOSIS — I4891 Unspecified atrial fibrillation: Secondary | ICD-10-CM | POA: Diagnosis not present

## 2015-06-02 DIAGNOSIS — I82722 Chronic embolism and thrombosis of deep veins of left upper extremity: Secondary | ICD-10-CM | POA: Diagnosis not present

## 2015-06-02 LAB — POCT INR: INR: 2.9

## 2015-06-04 ENCOUNTER — Encounter: Payer: Self-pay | Admitting: Internal Medicine

## 2015-06-04 ENCOUNTER — Ambulatory Visit (INDEPENDENT_AMBULATORY_CARE_PROVIDER_SITE_OTHER): Payer: Medicare Other | Admitting: Internal Medicine

## 2015-06-04 VITALS — BP 138/78 | HR 67 | Ht 70.0 in | Wt 194.0 lb

## 2015-06-04 DIAGNOSIS — I82722 Chronic embolism and thrombosis of deep veins of left upper extremity: Secondary | ICD-10-CM | POA: Diagnosis not present

## 2015-06-04 DIAGNOSIS — I471 Supraventricular tachycardia: Secondary | ICD-10-CM

## 2015-06-04 DIAGNOSIS — I10 Essential (primary) hypertension: Secondary | ICD-10-CM

## 2015-06-04 DIAGNOSIS — Z950 Presence of cardiac pacemaker: Secondary | ICD-10-CM | POA: Diagnosis not present

## 2015-06-04 DIAGNOSIS — I480 Paroxysmal atrial fibrillation: Secondary | ICD-10-CM

## 2015-06-04 LAB — CUP PACEART INCLINIC DEVICE CHECK
Battery Remaining Longevity: 61 mo
Battery Voltage: 3 V
Brady Statistic AP VP Percent: 0.3 %
Brady Statistic AS VS Percent: 1.06 %
Implantable Lead Implant Date: 20140130
Implantable Lead Implant Date: 20140130
Implantable Lead Location: 753860
Lead Channel Impedance Value: 380 Ohm
Lead Channel Impedance Value: 418 Ohm
Lead Channel Impedance Value: 589 Ohm
Lead Channel Pacing Threshold Amplitude: 0.5 V
Lead Channel Pacing Threshold Pulse Width: 0.4 ms
Lead Channel Setting Pacing Amplitude: 2.5 V
Lead Channel Setting Pacing Amplitude: 2.75 V
Lead Channel Setting Pacing Pulse Width: 0.4 ms
MDC IDC LEAD LOCATION: 753859
MDC IDC LEAD MODEL: 5086
MDC IDC LEAD MODEL: 5086
MDC IDC MSMT LEADCHNL RA PACING THRESHOLD AMPLITUDE: 1.25 V
MDC IDC MSMT LEADCHNL RA PACING THRESHOLD PULSEWIDTH: 0.4 ms
MDC IDC MSMT LEADCHNL RV IMPEDANCE VALUE: 494 Ohm
MDC IDC MSMT LEADCHNL RV SENSING INTR AMPL: 6.125 mV
MDC IDC SESS DTM: 20170118095845
MDC IDC SET LEADCHNL RV SENSING SENSITIVITY: 0.9 mV
MDC IDC STAT BRADY AP VS PERCENT: 98.63 %
MDC IDC STAT BRADY AS VP PERCENT: 0.01 %
MDC IDC STAT BRADY RA PERCENT PACED: 98.93 %
MDC IDC STAT BRADY RV PERCENT PACED: 0.31 %

## 2015-06-04 NOTE — Progress Notes (Signed)
HPI [name] returns today for followup. He is a very pleasant 75 year old man with a history of symptomatic sinus node dysfunction, status post permanent pacemaker insertion. His procedure was complicated by the development of left upper extremity swelling and documented left subclavian vein stenosis. Since I saw the patient last, his left arm swelling has stabilized. His only complaint today is that when he swims competitively, his left arm will swell to a point that he has to stop and rest.  He denies chest pain or shortness of breath and overall feels well. He is stretching and doing core work with a Clinical research associate. No Known Allergies   Current Outpatient Prescriptions  Medication Sig Dispense Refill  . atenolol (TENORMIN) 25 MG tablet Take 1 tablet (25 mg total) by mouth daily. 30 tablet 0  . Calcium Citrate-Vitamin D (CALCIUM CITRATE + PO) Take 1 tablet by mouth daily.    . Multiple Vitamin (MULTIVITAMIN WITH MINERALS) TABS Take 1 tablet by mouth daily.    . Omega-3 Fatty Acids (FISH OIL) 300 MG CAPS Take 1 capsule by mouth daily.    . simvastatin (ZOCOR) 40 MG tablet Take 1 tablet (40 mg total) by mouth daily. 90 tablet 2  . warfarin (COUMADIN) 2.5 MG tablet Take 1 tablet by mouth daily or as directed 90 tablet 1   No current facility-administered medications for this visit.     Past Medical History  Diagnosis Date  . H/O cardiovascular stress test     normal, done as a baseline , ? where it was done, 4-5 yrs. ago  . Hypertension   . 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 Hyperlipidemia 06/16/2012  . Chronic anticoagulation, on coumadin for PAF 06/16/2012  . Arthritis     knee- has been replaced so no longer a problem  . Clotting disorder (HCC)     hx of DVT  . PAF (paroxysmal atrial fibrillation) ([facility])   . Brady-tachy syndrome ([place] [facility])     ROS:   All systems reviewed and negative except as noted in the HPI.   Past Surgical  History  Procedure Laterality Date  . Colonoscopy    . Tonsillectomy    . 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 Knee arthroplasty  04/19/2012    Procedure: COMPUTER ASSISTED TOTAL KNEE ARTHROPLASTY;  Surgeon: Alta Corning, MD;  Location: [facility];  Service: Orthopedics;  Laterality: Right;  GENERAL WITH PRE OP FEMORAL NERVE BLOCK  . Insert / replace / remove pacemaker  06/15/2012    dual chamber  . Permanent pacemaker insertion N/A 06/15/2012    Procedure: PERMANENT PACEMAKER INSERTION;  Surgeon: Sanda Klein, MD;  Location: [facility];  Service: Cardiovascular;  Laterality: N/A;     Family History  Problem Relation Age of Onset  . Heart disease Father   . Colon cancer Neg Hx   . Esophageal cancer Neg Hx   . Rectal cancer Neg Hx   . Stomach cancer Neg Hx      Social History   Social History  . Marital Status: Married    Spouse Name: N/A  . Number of Children: 2  . Years of Education: N/A   Occupational History  . retired    Social History Main Topics  . Smoking status: Never Smoker   . Smokeless tobacco: Never Used  . Alcohol Use: Yes     Comment: week- 1-2 drinks, 1-2 beers   .  Drug Use: No  . Sexual Activity: Not on file   Other Topics Concern  . Not on file   Social History Narrative     BP 138/78 mmHg  Pulse 67  Ht 5\' 10"  (1.778 m)  Wt 194 lb (87.998 kg)  BMI 27.84 kg/m2  Physical Exam:  Well appearing 75 year old man,NAD HEENT: Unremarkable Neck:  6 cm JVD, no thyromegally Back:  No CVA tenderness Lungs:  Clear with no wheezes, rales, or rhonchi. HEART:  Regular rate rhythm, no murmurs, no rubs, no clicks Abd:  soft, positive bowel sounds, no organomegally, no rebound, no guarding Ext:  2 plus pulses, left arm slightly larger than the right, no cyanosis, no clubbing Skin:  No rashes no nodules Neuro:  CN II through XII intact, motor grossly intact  DEVICE  Normal device function.  See PaceArt for details.   Assess/Plan:

## 2015-06-04 NOTE — Assessment & Plan Note (Signed)
PM interogation demonstrates non-sustained AVNRT. Will follow. He is asymptomatic at this point.

## 2015-06-04 NOTE — Patient Instructions (Signed)
Your physician recommends that you continue on your current medications as directed. Please refer to the Current Medication list given to you today. Remote monitoring is used to monitor your Pacemaker of ICD from home. This monitoring reduces the number of office visits required to check your device to one time per year. It allows [place] to keep an eye on the functioning of your device to ensure it is working properly. You are scheduled for a device check from home on 09/03/15. You may send your transmission at any time that day. If you have a wireless device, the transmission will be sent automatically. After your physician reviews your transmission, you will receive a postcard with your next transmission date.  Your physician wants you to follow-up in: 1 year with Dr. Lovena Le.  You will receive a reminder letter in the mail two months in advance. If you don't receive a letter, please call our office to schedule the follow-up appointment.

## 2015-06-04 NOTE — Assessment & Plan Note (Signed)
He is maintaining NSR 99% of the time. He will continue his current meds. 

## 2015-06-04 NOTE — Assessment & Plan Note (Signed)
His left arm swelling remains but appears mild except when he does strenuous activity with his left arm.

## 2015-06-04 NOTE — Assessment & Plan Note (Signed)
His medtronic DDD PM is working normally. Will recheck in several months. 

## 2015-06-04 NOTE — Assessment & Plan Note (Signed)
His blood pressure is slightly elevated today. He will continue his current meds. I have encouraged him to reduce his sodium intake.

## 2015-07-14 ENCOUNTER — Ambulatory Visit (INDEPENDENT_AMBULATORY_CARE_PROVIDER_SITE_OTHER): Payer: Medicare Other | Admitting: Pharmacist Clinician (PhC)/ Clinical Pharmacy Specialist

## 2015-07-14 DIAGNOSIS — I82722 Chronic embolism and thrombosis of deep veins of left upper extremity: Secondary | ICD-10-CM | POA: Diagnosis not present

## 2015-07-14 DIAGNOSIS — I4891 Unspecified atrial fibrillation: Secondary | ICD-10-CM | POA: Diagnosis not present

## 2015-07-14 DIAGNOSIS — I480 Paroxysmal atrial fibrillation: Secondary | ICD-10-CM | POA: Diagnosis not present

## 2015-07-14 DIAGNOSIS — Z7901 Long term (current) use of anticoagulants: Secondary | ICD-10-CM

## 2015-07-14 LAB — POCT INR: INR: 2.1

## 2015-08-25 ENCOUNTER — Ambulatory Visit (INDEPENDENT_AMBULATORY_CARE_PROVIDER_SITE_OTHER): Payer: Medicare Other | Admitting: Pharmacist Clinician (PhC)/ Clinical Pharmacy Specialist

## 2015-08-25 DIAGNOSIS — I82722 Chronic embolism and thrombosis of deep veins of left upper extremity: Secondary | ICD-10-CM | POA: Diagnosis not present

## 2015-08-25 DIAGNOSIS — I480 Paroxysmal atrial fibrillation: Secondary | ICD-10-CM

## 2015-08-25 DIAGNOSIS — Z7901 Long term (current) use of anticoagulants: Secondary | ICD-10-CM

## 2015-08-25 DIAGNOSIS — I4891 Unspecified atrial fibrillation: Secondary | ICD-10-CM | POA: Diagnosis not present

## 2015-08-25 LAB — POCT INR: INR: 1.7

## 2015-09-03 ENCOUNTER — Ambulatory Visit: Payer: Medicare Other | Admitting: *Deleted

## 2015-09-03 ENCOUNTER — Telehealth: Payer: Self-pay | Admitting: Cardiology

## 2015-09-03 NOTE — Telephone Encounter (Signed)
LMOVM reminding pt to send remote transmission.   

## 2015-09-05 ENCOUNTER — Encounter: Payer: Self-pay | Admitting: Cardiology

## 2015-09-08 ENCOUNTER — Ambulatory Visit (INDEPENDENT_AMBULATORY_CARE_PROVIDER_SITE_OTHER): Payer: Medicare Other | Admitting: *Deleted

## 2015-09-08 DIAGNOSIS — R001 Bradycardia, unspecified: Secondary | ICD-10-CM | POA: Diagnosis not present

## 2015-09-08 LAB — CUP PACEART REMOTE DEVICE CHECK
Brady Statistic AP VP Percent: 0.36 %
Brady Statistic AP VS Percent: 98.7 %
Brady Statistic RA Percent Paced: 99.05 %
Brady Statistic RV Percent Paced: 0.37 %
Date Time Interrogation Session: 20170423153128
Implantable Lead Implant Date: 20140130
Implantable Lead Location: 753859
Implantable Lead Model: 5086
Lead Channel Impedance Value: 475 Ohm
Lead Channel Impedance Value: 570 Ohm
Lead Channel Pacing Threshold Pulse Width: 0.4 ms
Lead Channel Sensing Intrinsic Amplitude: 3.875 mV
Lead Channel Sensing Intrinsic Amplitude: 6.25 mV
Lead Channel Sensing Intrinsic Amplitude: 6.25 mV
Lead Channel Setting Pacing Amplitude: 2.5 V
Lead Channel Setting Pacing Amplitude: 3 V
Lead Channel Setting Pacing Pulse Width: 0.4 ms
Lead Channel Setting Sensing Sensitivity: 0.9 mV
MDC IDC LEAD IMPLANT DT: 20140130
MDC IDC LEAD LOCATION: 753860
MDC IDC LEAD MODEL: 5086
MDC IDC MSMT BATTERY REMAINING LONGEVITY: 55 mo
MDC IDC MSMT BATTERY VOLTAGE: 2.99 V
MDC IDC MSMT LEADCHNL RA IMPEDANCE VALUE: 399 Ohm
MDC IDC MSMT LEADCHNL RA IMPEDANCE VALUE: 437 Ohm
MDC IDC MSMT LEADCHNL RA PACING THRESHOLD AMPLITUDE: 1.5 V
MDC IDC MSMT LEADCHNL RA SENSING INTR AMPL: 3.875 mV
MDC IDC MSMT LEADCHNL RV PACING THRESHOLD AMPLITUDE: 0.625 V
MDC IDC MSMT LEADCHNL RV PACING THRESHOLD PULSEWIDTH: 0.4 ms
MDC IDC STAT BRADY AS VP PERCENT: 0.01 %
MDC IDC STAT BRADY AS VS PERCENT: 0.94 %

## 2015-09-08 NOTE — Progress Notes (Signed)
Remote pacemaker transmission.   

## 2015-09-15 ENCOUNTER — Other Ambulatory Visit: Payer: Self-pay

## 2015-09-15 MED ORDER — ATENOLOL 25 MG PO TABS: 25.0000 mg | ORAL_TABLET | Freq: Every day | ORAL | Status: DC

## 2015-09-22 ENCOUNTER — Ambulatory Visit (INDEPENDENT_AMBULATORY_CARE_PROVIDER_SITE_OTHER): Payer: Medicare Other | Admitting: Pharmacist Clinician (PhC)/ Clinical Pharmacy Specialist

## 2015-09-22 DIAGNOSIS — I480 Paroxysmal atrial fibrillation: Secondary | ICD-10-CM

## 2015-09-22 DIAGNOSIS — Z7901 Long term (current) use of anticoagulants: Secondary | ICD-10-CM | POA: Diagnosis not present

## 2015-09-22 DIAGNOSIS — I4891 Unspecified atrial fibrillation: Secondary | ICD-10-CM

## 2015-09-22 DIAGNOSIS — I82722 Chronic embolism and thrombosis of deep veins of left upper extremity: Secondary | ICD-10-CM

## 2015-09-22 DIAGNOSIS — Z950 Presence of cardiac pacemaker: Secondary | ICD-10-CM | POA: Diagnosis not present

## 2015-09-22 DIAGNOSIS — Z1389 Encounter for screening for other disorder: Secondary | ICD-10-CM | POA: Diagnosis not present

## 2015-09-22 DIAGNOSIS — E784 Other hyperlipidemia: Secondary | ICD-10-CM | POA: Diagnosis not present

## 2015-09-22 DIAGNOSIS — M5380 Other specified dorsopathies, site unspecified: Secondary | ICD-10-CM | POA: Diagnosis not present

## 2015-09-22 DIAGNOSIS — Z6826 Body mass index (BMI) 26.0-26.9, adult: Secondary | ICD-10-CM | POA: Diagnosis not present

## 2015-09-22 DIAGNOSIS — R030 Elevated blood-pressure reading, without diagnosis of hypertension: Secondary | ICD-10-CM | POA: Diagnosis not present

## 2015-09-22 LAB — POCT INR: INR: 1.8

## 2015-10-14 ENCOUNTER — Ambulatory Visit (INDEPENDENT_AMBULATORY_CARE_PROVIDER_SITE_OTHER): Payer: Medicare Other | Admitting: Pharmacist Clinician (PhC)/ Clinical Pharmacy Specialist

## 2015-10-14 DIAGNOSIS — I82722 Chronic embolism and thrombosis of deep veins of left upper extremity: Secondary | ICD-10-CM | POA: Diagnosis not present

## 2015-10-14 DIAGNOSIS — I4891 Unspecified atrial fibrillation: Secondary | ICD-10-CM | POA: Diagnosis not present

## 2015-10-14 DIAGNOSIS — I480 Paroxysmal atrial fibrillation: Secondary | ICD-10-CM

## 2015-10-14 DIAGNOSIS — Z7901 Long term (current) use of anticoagulants: Secondary | ICD-10-CM

## 2015-10-14 LAB — POCT INR: INR: 2.8

## 2015-10-17 ENCOUNTER — Encounter: Payer: Self-pay | Admitting: Cardiology

## 2015-11-25 ENCOUNTER — Ambulatory Visit (INDEPENDENT_AMBULATORY_CARE_PROVIDER_SITE_OTHER): Payer: Medicare Other | Admitting: Pharmacist Clinician (PhC)/ Clinical Pharmacy Specialist

## 2015-11-25 DIAGNOSIS — I480 Paroxysmal atrial fibrillation: Secondary | ICD-10-CM

## 2015-11-25 DIAGNOSIS — I4891 Unspecified atrial fibrillation: Secondary | ICD-10-CM

## 2015-11-25 DIAGNOSIS — I82722 Chronic embolism and thrombosis of deep veins of left upper extremity: Secondary | ICD-10-CM | POA: Diagnosis not present

## 2015-11-25 DIAGNOSIS — Z7901 Long term (current) use of anticoagulants: Secondary | ICD-10-CM

## 2015-11-25 LAB — POCT INR: INR: 1.5

## 2015-12-08 ENCOUNTER — Ambulatory Visit (INDEPENDENT_AMBULATORY_CARE_PROVIDER_SITE_OTHER): Payer: Medicare Other | Admitting: *Deleted

## 2015-12-08 ENCOUNTER — Telehealth: Payer: Self-pay | Admitting: Cardiology

## 2015-12-08 DIAGNOSIS — R001 Bradycardia, unspecified: Secondary | ICD-10-CM

## 2015-12-08 NOTE — Telephone Encounter (Signed)
Spoke with pt and reminded pt of remote transmission that is due today. Pt verbalized understanding.   

## 2015-12-08 NOTE — Progress Notes (Signed)
Remote pacemaker transmission.   

## 2015-12-09 LAB — CUP PACEART REMOTE DEVICE CHECK
Brady Statistic AS VS Percent: 1.42 %
Brady Statistic RA Percent Paced: 98.56 %
Implantable Lead Implant Date: 20140130
Implantable Lead Location: 753859
Implantable Lead Location: 753860
Implantable Lead Model: 5086
Implantable Lead Model: 5086
Lead Channel Impedance Value: 418 Ohm
Lead Channel Impedance Value: 456 Ohm
Lead Channel Pacing Threshold Amplitude: 0.375 V
Lead Channel Pacing Threshold Amplitude: 1.625 V
Lead Channel Pacing Threshold Pulse Width: 0.4 ms
Lead Channel Pacing Threshold Pulse Width: 0.4 ms
Lead Channel Sensing Intrinsic Amplitude: 4.125 mV
MDC IDC LEAD IMPLANT DT: 20140130
MDC IDC MSMT BATTERY REMAINING LONGEVITY: 53 mo
MDC IDC MSMT BATTERY VOLTAGE: 2.99 V
MDC IDC MSMT LEADCHNL RA SENSING INTR AMPL: 4.125 mV
MDC IDC MSMT LEADCHNL RV IMPEDANCE VALUE: 494 Ohm
MDC IDC MSMT LEADCHNL RV IMPEDANCE VALUE: 608 Ohm
MDC IDC MSMT LEADCHNL RV SENSING INTR AMPL: 6.75 mV
MDC IDC MSMT LEADCHNL RV SENSING INTR AMPL: 6.75 mV
MDC IDC SESS DTM: 20170724164743
MDC IDC SET LEADCHNL RA PACING AMPLITUDE: 3.25 V
MDC IDC SET LEADCHNL RV PACING AMPLITUDE: 2.5 V
MDC IDC SET LEADCHNL RV PACING PULSEWIDTH: 0.4 ms
MDC IDC SET LEADCHNL RV SENSING SENSITIVITY: 0.9 mV
MDC IDC STAT BRADY AP VP PERCENT: 0.17 %
MDC IDC STAT BRADY AP VS PERCENT: 98.39 %
MDC IDC STAT BRADY AS VP PERCENT: 0.02 %
MDC IDC STAT BRADY RV PERCENT PACED: 0.19 %

## 2015-12-10 ENCOUNTER — Encounter: Payer: Self-pay | Admitting: Cardiology

## 2015-12-22 ENCOUNTER — Ambulatory Visit (INDEPENDENT_AMBULATORY_CARE_PROVIDER_SITE_OTHER): Payer: Medicare Other | Admitting: Pharmacist Clinician (PhC)/ Clinical Pharmacy Specialist

## 2015-12-22 DIAGNOSIS — I82722 Chronic embolism and thrombosis of deep veins of left upper extremity: Secondary | ICD-10-CM | POA: Diagnosis not present

## 2015-12-22 DIAGNOSIS — Z7901 Long term (current) use of anticoagulants: Secondary | ICD-10-CM | POA: Diagnosis not present

## 2015-12-22 DIAGNOSIS — I4891 Unspecified atrial fibrillation: Secondary | ICD-10-CM

## 2015-12-22 LAB — POCT INR: INR: 3.4

## 2015-12-23 ENCOUNTER — Other Ambulatory Visit: Payer: Self-pay | Admitting: *Deleted

## 2015-12-23 MED ORDER — ATENOLOL 25 MG PO TABS: 25.0000 mg | ORAL_TABLET | Freq: Every day | ORAL | 0 refills | Status: DC

## 2015-12-24 ENCOUNTER — Other Ambulatory Visit: Payer: Self-pay | Admitting: Pharmacist Clinician (PhC)/ Clinical Pharmacy Specialist

## 2015-12-24 MED ORDER — WARFARIN SODIUM 2.5 MG PO TABS: ORAL_TABLET | ORAL | 1 refills | Status: DC

## 2015-12-31 ENCOUNTER — Other Ambulatory Visit: Payer: Self-pay | Admitting: *Deleted

## 2015-12-31 MED ORDER — ATENOLOL 25 MG PO TABS: 25.0000 mg | ORAL_TABLET | Freq: Every day | ORAL | 0 refills | Status: DC

## 2015-12-31 NOTE — Telephone Encounter (Signed)
Patient calling to receive short order of Atenolol until mail order is received from [facility].

## 2016-01-01 ENCOUNTER — Other Ambulatory Visit: Payer: Self-pay | Admitting: *Deleted

## 2016-01-01 MED ORDER — ATENOLOL 25 MG PO TABS: 25.0000 mg | ORAL_TABLET | Freq: Every day | ORAL | 4 refills | Status: DC

## 2016-01-16 ENCOUNTER — Ambulatory Visit (INDEPENDENT_AMBULATORY_CARE_PROVIDER_SITE_OTHER): Payer: Medicare Other | Admitting: Pharmacist

## 2016-01-16 DIAGNOSIS — I82722 Chronic embolism and thrombosis of deep veins of left upper extremity: Secondary | ICD-10-CM | POA: Diagnosis not present

## 2016-01-16 DIAGNOSIS — Z7901 Long term (current) use of anticoagulants: Secondary | ICD-10-CM

## 2016-01-16 DIAGNOSIS — I4891 Unspecified atrial fibrillation: Secondary | ICD-10-CM

## 2016-01-16 LAB — POCT INR: INR: 3

## 2016-01-20 DIAGNOSIS — H2513 Age-related nuclear cataract, bilateral: Secondary | ICD-10-CM | POA: Diagnosis not present

## 2016-01-20 DIAGNOSIS — H43813 Vitreous degeneration, bilateral: Secondary | ICD-10-CM | POA: Diagnosis not present

## 2016-01-20 DIAGNOSIS — H524 Presbyopia: Secondary | ICD-10-CM | POA: Diagnosis not present

## 2016-01-20 DIAGNOSIS — D2311 Other benign neoplasm of skin of right eyelid, including canthus: Secondary | ICD-10-CM | POA: Diagnosis not present

## 2016-02-16 ENCOUNTER — Ambulatory Visit (INDEPENDENT_AMBULATORY_CARE_PROVIDER_SITE_OTHER): Payer: Medicare Other | Admitting: Pharmacist Clinician (PhC)/ Clinical Pharmacy Specialist

## 2016-02-16 DIAGNOSIS — I4891 Unspecified atrial fibrillation: Secondary | ICD-10-CM | POA: Diagnosis not present

## 2016-02-16 DIAGNOSIS — I82722 Chronic embolism and thrombosis of deep veins of left upper extremity: Secondary | ICD-10-CM | POA: Diagnosis not present

## 2016-02-16 DIAGNOSIS — Z7901 Long term (current) use of anticoagulants: Secondary | ICD-10-CM

## 2016-02-16 DIAGNOSIS — M1611 Unilateral primary osteoarthritis, right hip: Secondary | ICD-10-CM | POA: Diagnosis not present

## 2016-02-16 LAB — POCT INR: INR: 4

## 2016-02-18 DIAGNOSIS — M1611 Unilateral primary osteoarthritis, right hip: Secondary | ICD-10-CM | POA: Diagnosis not present

## 2016-02-19 DIAGNOSIS — M1611 Unilateral primary osteoarthritis, right hip: Secondary | ICD-10-CM | POA: Diagnosis not present

## 2016-03-01 ENCOUNTER — Ambulatory Visit (INDEPENDENT_AMBULATORY_CARE_PROVIDER_SITE_OTHER): Payer: Medicare Other | Admitting: Pharmacist

## 2016-03-01 DIAGNOSIS — I4891 Unspecified atrial fibrillation: Secondary | ICD-10-CM

## 2016-03-01 DIAGNOSIS — I82722 Chronic embolism and thrombosis of deep veins of left upper extremity: Secondary | ICD-10-CM | POA: Diagnosis not present

## 2016-03-01 DIAGNOSIS — Z7901 Long term (current) use of anticoagulants: Secondary | ICD-10-CM

## 2016-03-01 LAB — POCT INR: INR: 2.1

## 2016-03-04 DIAGNOSIS — M545 Low back pain: Secondary | ICD-10-CM | POA: Diagnosis not present

## 2016-03-04 DIAGNOSIS — M1611 Unilateral primary osteoarthritis, right hip: Secondary | ICD-10-CM | POA: Diagnosis not present

## 2016-03-04 DIAGNOSIS — M5442 Lumbago with sciatica, left side: Secondary | ICD-10-CM | POA: Diagnosis not present

## 2016-03-08 ENCOUNTER — Ambulatory Visit: Payer: Medicare Other | Admitting: *Deleted

## 2016-03-08 ENCOUNTER — Telehealth: Payer: Self-pay | Admitting: Cardiology

## 2016-03-08 DIAGNOSIS — I495 Sick sinus syndrome: Secondary | ICD-10-CM

## 2016-03-08 NOTE — Telephone Encounter (Signed)
Confirmed remote transmission w/ pt wife.   

## 2016-03-15 DIAGNOSIS — M1991 Primary osteoarthritis, unspecified site: Secondary | ICD-10-CM | POA: Diagnosis not present

## 2016-03-15 DIAGNOSIS — M47896 Other spondylosis, lumbar region: Secondary | ICD-10-CM | POA: Diagnosis not present

## 2016-03-15 DIAGNOSIS — M5416 Radiculopathy, lumbar region: Secondary | ICD-10-CM | POA: Diagnosis not present

## 2016-03-15 DIAGNOSIS — M545 Low back pain: Secondary | ICD-10-CM | POA: Diagnosis not present

## 2016-03-16 DIAGNOSIS — M5416 Radiculopathy, lumbar region: Secondary | ICD-10-CM | POA: Diagnosis not present

## 2016-03-16 DIAGNOSIS — M1991 Primary osteoarthritis, unspecified site: Secondary | ICD-10-CM | POA: Diagnosis not present

## 2016-03-16 DIAGNOSIS — M545 Low back pain: Secondary | ICD-10-CM | POA: Diagnosis not present

## 2016-03-16 DIAGNOSIS — M47896 Other spondylosis, lumbar region: Secondary | ICD-10-CM | POA: Diagnosis not present

## 2016-03-18 DIAGNOSIS — M545 Low back pain: Secondary | ICD-10-CM | POA: Diagnosis not present

## 2016-03-18 DIAGNOSIS — M5416 Radiculopathy, lumbar region: Secondary | ICD-10-CM | POA: Diagnosis not present

## 2016-03-18 DIAGNOSIS — M47896 Other spondylosis, lumbar region: Secondary | ICD-10-CM | POA: Diagnosis not present

## 2016-03-18 DIAGNOSIS — M1991 Primary osteoarthritis, unspecified site: Secondary | ICD-10-CM | POA: Diagnosis not present

## 2016-03-19 DIAGNOSIS — M545 Low back pain: Secondary | ICD-10-CM | POA: Diagnosis not present

## 2016-03-19 DIAGNOSIS — M5416 Radiculopathy, lumbar region: Secondary | ICD-10-CM | POA: Diagnosis not present

## 2016-03-19 DIAGNOSIS — M1991 Primary osteoarthritis, unspecified site: Secondary | ICD-10-CM | POA: Diagnosis not present

## 2016-03-19 DIAGNOSIS — M47896 Other spondylosis, lumbar region: Secondary | ICD-10-CM | POA: Diagnosis not present

## 2016-03-22 DIAGNOSIS — M1991 Primary osteoarthritis, unspecified site: Secondary | ICD-10-CM | POA: Diagnosis not present

## 2016-03-22 DIAGNOSIS — M5416 Radiculopathy, lumbar region: Secondary | ICD-10-CM | POA: Diagnosis not present

## 2016-03-22 DIAGNOSIS — M47896 Other spondylosis, lumbar region: Secondary | ICD-10-CM | POA: Diagnosis not present

## 2016-03-22 DIAGNOSIS — M545 Low back pain: Secondary | ICD-10-CM | POA: Diagnosis not present

## 2016-03-23 DIAGNOSIS — M1991 Primary osteoarthritis, unspecified site: Secondary | ICD-10-CM | POA: Diagnosis not present

## 2016-03-23 DIAGNOSIS — M47896 Other spondylosis, lumbar region: Secondary | ICD-10-CM | POA: Diagnosis not present

## 2016-03-23 DIAGNOSIS — M5416 Radiculopathy, lumbar region: Secondary | ICD-10-CM | POA: Diagnosis not present

## 2016-03-23 DIAGNOSIS — M545 Low back pain: Secondary | ICD-10-CM | POA: Diagnosis not present

## 2016-03-24 ENCOUNTER — Ambulatory Visit (INDEPENDENT_AMBULATORY_CARE_PROVIDER_SITE_OTHER): Payer: Medicare Other | Admitting: Pharmacist Clinician (PhC)/ Clinical Pharmacy Specialist

## 2016-03-24 DIAGNOSIS — I4891 Unspecified atrial fibrillation: Secondary | ICD-10-CM

## 2016-03-24 DIAGNOSIS — I82722 Chronic embolism and thrombosis of deep veins of left upper extremity: Secondary | ICD-10-CM

## 2016-03-24 DIAGNOSIS — Z7901 Long term (current) use of anticoagulants: Secondary | ICD-10-CM | POA: Diagnosis not present

## 2016-03-24 LAB — POCT INR: INR: 2.5

## 2016-03-29 DIAGNOSIS — M1991 Primary osteoarthritis, unspecified site: Secondary | ICD-10-CM | POA: Diagnosis not present

## 2016-03-29 DIAGNOSIS — E784 Other hyperlipidemia: Secondary | ICD-10-CM | POA: Diagnosis not present

## 2016-03-29 DIAGNOSIS — N390 Urinary tract infection, site not specified: Secondary | ICD-10-CM | POA: Diagnosis not present

## 2016-03-29 DIAGNOSIS — M545 Low back pain: Secondary | ICD-10-CM | POA: Diagnosis not present

## 2016-03-29 DIAGNOSIS — Z125 Encounter for screening for malignant neoplasm of prostate: Secondary | ICD-10-CM | POA: Diagnosis not present

## 2016-03-29 DIAGNOSIS — M47896 Other spondylosis, lumbar region: Secondary | ICD-10-CM | POA: Diagnosis not present

## 2016-03-29 DIAGNOSIS — M5416 Radiculopathy, lumbar region: Secondary | ICD-10-CM | POA: Diagnosis not present

## 2016-03-29 DIAGNOSIS — Z79899 Other long term (current) drug therapy: Secondary | ICD-10-CM | POA: Diagnosis not present

## 2016-03-30 DIAGNOSIS — M1991 Primary osteoarthritis, unspecified site: Secondary | ICD-10-CM | POA: Diagnosis not present

## 2016-03-30 DIAGNOSIS — M47896 Other spondylosis, lumbar region: Secondary | ICD-10-CM | POA: Diagnosis not present

## 2016-03-30 DIAGNOSIS — M545 Low back pain: Secondary | ICD-10-CM | POA: Diagnosis not present

## 2016-04-01 DIAGNOSIS — M1991 Primary osteoarthritis, unspecified site: Secondary | ICD-10-CM | POA: Diagnosis not present

## 2016-04-01 DIAGNOSIS — M545 Low back pain: Secondary | ICD-10-CM | POA: Diagnosis not present

## 2016-04-01 DIAGNOSIS — M47896 Other spondylosis, lumbar region: Secondary | ICD-10-CM | POA: Diagnosis not present

## 2016-04-01 DIAGNOSIS — M5416 Radiculopathy, lumbar region: Secondary | ICD-10-CM | POA: Diagnosis not present

## 2016-04-03 LAB — CUP PACEART REMOTE DEVICE CHECK
Battery Remaining Longevity: 50 mo
Battery Voltage: 2.99 V
Brady Statistic AS VS Percent: 1.5 %
Implantable Lead Implant Date: 20140130
Implantable Lead Location: 753859
Implantable Lead Location: 753860
Implantable Pulse Generator Implant Date: 20140130
Lead Channel Impedance Value: 437 Ohm
Lead Channel Impedance Value: 494 Ohm
Lead Channel Pacing Threshold Amplitude: 0.375 V
Lead Channel Pacing Threshold Amplitude: 1.375 V
Lead Channel Pacing Threshold Pulse Width: 0.4 ms
Lead Channel Pacing Threshold Pulse Width: 0.4 ms
Lead Channel Sensing Intrinsic Amplitude: 4.875 mV
Lead Channel Setting Pacing Amplitude: 3 V
Lead Channel Setting Pacing Pulse Width: 0.4 ms
MDC IDC LEAD IMPLANT DT: 20140130
MDC IDC LEAD MODEL: 5086
MDC IDC LEAD MODEL: 5086
MDC IDC MSMT LEADCHNL RA SENSING INTR AMPL: 4.875 mV
MDC IDC MSMT LEADCHNL RV IMPEDANCE VALUE: 551 Ohm
MDC IDC MSMT LEADCHNL RV IMPEDANCE VALUE: 684 Ohm
MDC IDC MSMT LEADCHNL RV SENSING INTR AMPL: 7.25 mV
MDC IDC MSMT LEADCHNL RV SENSING INTR AMPL: 7.25 mV
MDC IDC SESS DTM: 20171020145643
MDC IDC SET LEADCHNL RV PACING AMPLITUDE: 2.5 V
MDC IDC SET LEADCHNL RV SENSING SENSITIVITY: 0.9 mV
MDC IDC STAT BRADY AP VP PERCENT: 0.36 %
MDC IDC STAT BRADY AP VS PERCENT: 98.13 %
MDC IDC STAT BRADY AS VP PERCENT: 0.01 %
MDC IDC STAT BRADY RA PERCENT PACED: 98.49 %
MDC IDC STAT BRADY RV PERCENT PACED: 0.37 %

## 2016-04-03 NOTE — Progress Notes (Signed)
Normal remote reviewed. 0.1% AF, V rates elevated, +Wafarin, Atenolol 25mg   Next follow up 05/2016 in office.

## 2016-04-12 DIAGNOSIS — Z950 Presence of cardiac pacemaker: Secondary | ICD-10-CM | POA: Diagnosis not present

## 2016-04-12 DIAGNOSIS — I4891 Unspecified atrial fibrillation: Secondary | ICD-10-CM | POA: Diagnosis not present

## 2016-04-12 DIAGNOSIS — Z7901 Long term (current) use of anticoagulants: Secondary | ICD-10-CM | POA: Diagnosis not present

## 2016-04-12 DIAGNOSIS — M1990 Unspecified osteoarthritis, unspecified site: Secondary | ICD-10-CM | POA: Diagnosis not present

## 2016-04-12 DIAGNOSIS — E784 Other hyperlipidemia: Secondary | ICD-10-CM | POA: Diagnosis not present

## 2016-04-12 DIAGNOSIS — Z23 Encounter for immunization: Secondary | ICD-10-CM | POA: Diagnosis not present

## 2016-04-12 DIAGNOSIS — Z6826 Body mass index (BMI) 26.0-26.9, adult: Secondary | ICD-10-CM | POA: Diagnosis not present

## 2016-04-12 DIAGNOSIS — Z0000 Encounter for general adult medical examination without abnormal findings: Secondary | ICD-10-CM | POA: Diagnosis not present

## 2016-04-12 DIAGNOSIS — R030 Elevated blood-pressure reading, without diagnosis of hypertension: Secondary | ICD-10-CM | POA: Diagnosis not present

## 2016-04-13 DIAGNOSIS — M47896 Other spondylosis, lumbar region: Secondary | ICD-10-CM | POA: Diagnosis not present

## 2016-04-13 DIAGNOSIS — M5416 Radiculopathy, lumbar region: Secondary | ICD-10-CM | POA: Diagnosis not present

## 2016-04-13 DIAGNOSIS — M1991 Primary osteoarthritis, unspecified site: Secondary | ICD-10-CM | POA: Diagnosis not present

## 2016-04-13 DIAGNOSIS — M545 Low back pain: Secondary | ICD-10-CM | POA: Diagnosis not present

## 2016-04-13 DIAGNOSIS — M1611 Unilateral primary osteoarthritis, right hip: Secondary | ICD-10-CM | POA: Diagnosis not present

## 2016-04-19 ENCOUNTER — Other Ambulatory Visit: Payer: Self-pay | Admitting: Orthopedic Surgery

## 2016-04-21 ENCOUNTER — Ambulatory Visit (INDEPENDENT_AMBULATORY_CARE_PROVIDER_SITE_OTHER): Payer: Medicare Other | Admitting: Pharmacist Clinician (PhC)/ Clinical Pharmacy Specialist

## 2016-04-21 DIAGNOSIS — I4891 Unspecified atrial fibrillation: Secondary | ICD-10-CM | POA: Diagnosis not present

## 2016-04-21 DIAGNOSIS — Z7901 Long term (current) use of anticoagulants: Secondary | ICD-10-CM | POA: Diagnosis not present

## 2016-04-21 DIAGNOSIS — I82722 Chronic embolism and thrombosis of deep veins of left upper extremity: Secondary | ICD-10-CM | POA: Diagnosis not present

## 2016-04-21 LAB — POCT INR: INR: 2.2

## 2016-04-26 ENCOUNTER — Telehealth: Payer: Self-pay | Admitting: Internal Medicine

## 2016-04-26 NOTE — Telephone Encounter (Signed)
Called, left voice message for [place]. Informed I will review clearance documents with Dr. Lovena Le tomorrow.

## 2016-04-26 NOTE — Telephone Encounter (Signed)
[name] from [facility] is calling to check on the status of a surgical clearance faxed to [place] on 04/13/16.

## 2016-05-03 NOTE — Progress Notes (Signed)
Electrophysiology Office Note Date: 05/05/2016  ID:  Richard [name], DOB Aug 26, 1940, MRN [id number]  PCP: Geoffery Lyons, MD Electrophysiologist: Lovena Le  CC: Pacemaker follow-up  Richard [name] is a 75 y.o. male seen today for Dr Lovena Le.  He presents today for routine electrophysiology followup.  Since last being seen in our clinic, the patient reports doing very well. He continues to be very active playing golf and walking all 18 holes.  He is pending hip surgery with Dr Berenice Primas.  He denies chest pain, palpitations, dyspnea, PND, orthopnea, nausea, vomiting, dizziness, syncope, edema, weight gain, or early satiety.  Device History: MDT MRI compatible dual chamber PPM implanted 2014 for sinus node dysfunction    Past Medical History:  Diagnosis Date  . 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 Brady-tachy syndrome ([facility])   . Chronic anticoagulation, on coumadin for PAF 06/16/2012  . Clotting disorder (HCC)    hx of DVT  . H/O cardiovascular stress test    normal, done as a baseline , ? where it was done, 4-5 yrs. ago  . Hyperlipidemia 06/16/2012  . Hypertension   . 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 PAF (paroxysmal atrial fibrillation) ([place] [facility])    Past Surgical History:  Procedure Laterality Date  . COLONOSCOPY    . INSERT / REPLACE / REMOVE PACEMAKER  06/15/2012   dual chamber  . KNEE ARTHROPLASTY  04/19/2012   Procedure: COMPUTER ASSISTED TOTAL KNEE ARTHROPLASTY;  Surgeon: Alta Corning, MD;  Location: [facility];  Service: Orthopedics;  Laterality: Right;  GENERAL WITH PRE OP FEMORAL NERVE BLOCK  . PERMANENT PACEMAKER INSERTION N/A 06/15/2012   Procedure: PERMANENT PACEMAKER INSERTION;  Surgeon: Sanda Klein, MD;  Location: [facility];  Service: Cardiovascular;  Laterality: N/A;  . TONSILLECTOMY    . TOTAL KNEE ARTHROPLASTY  04/19/2012   RIGHT KNEE    Current Outpatient Prescriptions    Medication Sig Dispense Refill  . atenolol (TENORMIN) 25 MG tablet Take 1 tablet (25 mg total) by mouth daily. 30 tablet 4  . Calcium Citrate-Vitamin D (CALCIUM CITRATE + PO) Take 1 tablet by mouth daily.    . Multiple Vitamin (MULTIVITAMIN WITH MINERALS) TABS Take 1 tablet by mouth daily.    . Omega-3 Fatty Acids (FISH OIL) 300 MG CAPS Take 1 capsule by mouth daily.    . simvastatin (ZOCOR) 40 MG tablet Take 1 tablet (40 mg total) by mouth daily. 90 tablet 2  . warfarin (COUMADIN) 2.5 MG tablet Take 1 tablet by mouth daily or as directed 90 tablet 1   No current facility-administered medications for this visit.     Allergies:   Patient has no known allergies.   Social History: Social History   Social History  . Marital status: Married    Spouse name: N/A  . Number of children: 2  . Years of education: N/A   Occupational History  . retired    Social History Main Topics  . Smoking status: Never Smoker  . Smokeless tobacco: Never Used  . Alcohol use Yes     Comment: week- 1-2 drinks, 1-2 beers   . Drug use: No  . Sexual activity: Not on file   Other Topics Concern  . Not on file   Social History Narrative  . No narrative on file    Family History: Family History  Problem Relation Age of Onset  . Heart disease Father   .  Colon cancer Neg Hx   . Esophageal cancer Neg Hx   . Rectal cancer Neg Hx   . Stomach cancer Neg Hx      Review of Systems: All other systems reviewed and are otherwise negative except as noted above.   Physical Exam: VS:  BP 140/82   Pulse 88   Ht 5\' 10"  (1.778 m)   Wt 191 lb (86.6 kg)   BMI 27.41 kg/m  , BMI Body mass index is 27.41 kg/m.  GEN- The patient is well appearing, alert and oriented x 3 today.   HEENT: normocephalic, atraumatic; sclera clear, conjunctiva pink; hearing intact; oropharynx clear; neck supple  Lungs- Clear to ausculation bilaterally, normal work of breathing.  No wheezes, rales, rhonchi Heart- Regular rate  and rhythm, no murmurs, rubs or gallops  GI- soft, non-tender, non-distended, bowel sounds present  Extremities- no clubbing, cyanosis, or edema  MS- no significant deformity or atrophy Skin- warm and dry, no rash or lesion; PPM pocket well healed Psych- euthymic mood, full affect Neuro- strength and sensation are intact  PPM Interrogation- reviewed in detail today,  See PACEART report  EKG:  EKG is ordered today. The ekg ordered today shows atrial pacing with intrinsic ventricular conduction   Recent Labs: No results found for requested labs within last 8760 hours.   Wt Readings from Last 3 Encounters:  05/05/16 191 lb (86.6 kg)  06/04/15 194 lb (88 kg)  05/14/14 195 lb (88.5 kg)     Other studies Reviewed: Additional studies/ records that were reviewed today include: Dr Tanna Furry office notes   Assessment and Plan:  1.  Symptomatic bradycardia Normal PPM function See Pace Art report No changes today  2.  HTN Stable No change required today  3.  Paroxysmal atrial fibrillation  Burden by device interrogation 0.2% Pt remains asymptomatic  Will follow  Continue Warfarin for CHADS2VASC of 2  4.  Surgical clearance  Pt is able to perform >4 mets without chest pain or shortness of breath No cardiac testing prior to hip surgery is indicated No intervention is needed for pacemaker at time of surgery Ok to hold Warfarin as needed for procedure Would continue BB in the peri-operative period. He has known paroxysmal atrial fibrillation with RVR that is usually self-limiting. We are available if needed.    Current medicines are reviewed at length with the patient today.   The patient does not have concerns regarding his medicines.  The following changes were made today:  none  Labs/ tests ordered today include: none No orders of the defined types were placed in this encounter.    Disposition:   Follow up with [facility], Dr Lovena Le 1 year     Signed, Chanetta Marshall,  NP 05/05/2016 8:37 AM  [address] [place] [place] [address] [phone] (office) [phone] (fax)

## 2016-05-04 DIAGNOSIS — Z1212 Encounter for screening for malignant neoplasm of rectum: Secondary | ICD-10-CM | POA: Diagnosis not present

## 2016-05-05 ENCOUNTER — Ambulatory Visit (INDEPENDENT_AMBULATORY_CARE_PROVIDER_SITE_OTHER): Payer: Medicare Other | Admitting: Nurse Practitioner

## 2016-05-05 VITALS — BP 140/82 | HR 88 | Ht 70.0 in | Wt 191.0 lb

## 2016-05-05 DIAGNOSIS — I480 Paroxysmal atrial fibrillation: Secondary | ICD-10-CM | POA: Diagnosis not present

## 2016-05-05 DIAGNOSIS — R001 Bradycardia, unspecified: Secondary | ICD-10-CM

## 2016-05-05 DIAGNOSIS — I10 Essential (primary) hypertension: Secondary | ICD-10-CM | POA: Diagnosis not present

## 2016-05-05 LAB — CUP PACEART INCLINIC DEVICE CHECK
Implantable Lead Implant Date: 20140130
Implantable Lead Location: 753860
Implantable Lead Model: 5086
Implantable Pulse Generator Implant Date: 20140130
MDC IDC LEAD IMPLANT DT: 20140130
MDC IDC LEAD LOCATION: 753859
MDC IDC LEAD MODEL: 5086
MDC IDC SESS DTM: 20171220120814

## 2016-05-05 NOTE — Patient Instructions (Signed)
Medication Instructions:   Your physician recommends that you continue on your current medications as directed. Please refer to the Current Medication list given to you today.    If you need a refill on your cardiac medications before your next appointment, please call your pharmacy.  Labwork: NONE ORDERED  TODAY    Testing/Procedures: NONE ORDERED  TODAY    Follow-Up: Your physician wants you to follow-up in: [place] will receive a reminder letter in the mail two months in advance. If you don't receive a letter, please call our office to schedule the follow-up appointment.  Remote monitoring is used to monitor your Pacemaker of ICD from home. This monitoring reduces the number of office visits required to check your device to one time per year. It allows [place] to keep an eye on the functioning of your device to ensure it is working properly. You are scheduled for a device check from home on . 08/04/2016..You may send your transmission at any time that day. If you have a wireless device, the transmission will be sent automatically. After your physician reviews your transmission, you will receive a postcard with your next transmission date. \]   Any Other Special Instructions Will Be Listed Below (If Applicable).

## 2016-05-12 NOTE — Pre-Procedure Instructions (Signed)
    Conlee Jonassen Korff  05/12/2016      [facility] [address] [place], [place] [address] [place] [address] Phone: [phone] Fax: [phone]  [facility] ([place]) [place], [place] [address] Phone: [phone] Fax: [phone]  [facility] filled by [place], [place] [address] [place] [address] Phone: [phone] Fax: [place], [address] [place] [address] [place] [address] Phone: [phone] Fax: [phone]    Your procedure is scheduled on 05/21/16.  Report to [facility] Admitting at 830 A.M.  Call this number if you have problems the morning of surgery:  [phone]   Remember:  Do not eat food or drink liquids after midnight.  Take these medicines the morning of surgery with A SIP OF WATER --atenolol   Do not wear jewelry, make-up or nail polish.  Do not wear lotions, powders, or perfumes, or deoderant.  Do not shave 48 hours prior to surgery.  Men may shave face and neck.  Do not bring valuables to the hospital.  [facility] is not responsible for any belongings or valuables.  Contacts, dentures or bridgework may not be worn into surgery.  Leave your suitcase in the car.  After surgery it may be brought to your room.  For patients admitted to the hospital, discharge time will be determined by your treatment team.  Patients discharged the day of surgery will not be allowed to drive home.   Name and phone number of your driver:    Special instructions:  Do not take any aspirin,anti-inflammatories,vitamins,or herbal supplements 5-7 days prior to surgery.  Please read over the following fact sheets that you were given. MRSA Information

## 2016-05-13 ENCOUNTER — Encounter (HOSPITAL_COMMUNITY): Payer: Self-pay

## 2016-05-13 ENCOUNTER — Encounter (HOSPITAL_COMMUNITY)
Admission: RE | Admit: 2016-05-13 | Discharge: 2016-05-13 | Disposition: A | Discharge status: Home / Self Care | Payer: Medicare Other | Source: Ambulatory Visit | Attending: Orthopedic Surgery | Admitting: Orthopedic Surgery

## 2016-05-13 DIAGNOSIS — Z01818 Encounter for other preprocedural examination: Secondary | ICD-10-CM | POA: Insufficient documentation

## 2016-05-13 DIAGNOSIS — Z01812 Encounter for preprocedural laboratory examination: Secondary | ICD-10-CM | POA: Insufficient documentation

## 2016-05-13 DIAGNOSIS — E785 Hyperlipidemia, unspecified: Secondary | ICD-10-CM | POA: Diagnosis not present

## 2016-05-13 DIAGNOSIS — R001 Bradycardia, unspecified: Secondary | ICD-10-CM | POA: Diagnosis not present

## 2016-05-13 DIAGNOSIS — I4891 Unspecified atrial fibrillation: Secondary | ICD-10-CM | POA: Diagnosis not present

## 2016-05-13 DIAGNOSIS — I480 Paroxysmal atrial fibrillation: Secondary | ICD-10-CM | POA: Diagnosis not present

## 2016-05-13 DIAGNOSIS — Z950 Presence of cardiac pacemaker: Secondary | ICD-10-CM | POA: Insufficient documentation

## 2016-05-13 DIAGNOSIS — Z86718 Personal history of other venous thrombosis and embolism: Secondary | ICD-10-CM | POA: Insufficient documentation

## 2016-05-13 DIAGNOSIS — M1711 Unilateral primary osteoarthritis, right knee: Secondary | ICD-10-CM | POA: Diagnosis not present

## 2016-05-13 DIAGNOSIS — I10 Essential (primary) hypertension: Secondary | ICD-10-CM | POA: Insufficient documentation

## 2016-05-13 DIAGNOSIS — I471 Supraventricular tachycardia: Secondary | ICD-10-CM | POA: Diagnosis not present

## 2016-05-13 DIAGNOSIS — I4510 Unspecified right bundle-branch block: Secondary | ICD-10-CM | POA: Insufficient documentation

## 2016-05-13 DIAGNOSIS — Z7901 Long term (current) use of anticoagulants: Secondary | ICD-10-CM | POA: Insufficient documentation

## 2016-05-13 DIAGNOSIS — Z471 Aftercare following joint replacement surgery: Secondary | ICD-10-CM | POA: Diagnosis not present

## 2016-05-13 DIAGNOSIS — Z96649 Presence of unspecified artificial hip joint: Secondary | ICD-10-CM | POA: Diagnosis not present

## 2016-05-13 HISTORY — DX: Presence of cardiac pacemaker: Z95.0

## 2016-05-13 HISTORY — DX: Cardiac arrhythmia, unspecified: I49.9

## 2016-05-13 LAB — CBC WITH DIFFERENTIAL/PLATELET
Basophils Absolute: 0.1 10*3/uL (ref 0.0–0.1)
Basophils Relative: 1 %
EOS ABS: 0.4 10*3/uL (ref 0.0–0.7)
Eosinophils Relative: 5 %
HEMATOCRIT: 47.1 % (ref 39.0–52.0)
HEMOGLOBIN: 16.5 g/dL (ref 13.0–17.0)
LYMPHS ABS: 2.5 10*3/uL (ref 0.7–4.0)
Lymphocytes Relative: 31 %
MCH: 32.5 pg (ref 26.0–34.0)
MCHC: 35 g/dL (ref 30.0–36.0)
MCV: 92.7 fL (ref 78.0–100.0)
Monocytes Absolute: 0.7 10*3/uL (ref 0.1–1.0)
Monocytes Relative: 8 %
NEUTROS ABS: 4.5 10*3/uL (ref 1.7–7.7)
NEUTROS PCT: 55 %
Platelets: 178 10*3/uL (ref 150–400)
RBC: 5.08 MIL/uL (ref 4.22–5.81)
RDW: 12.2 % (ref 11.5–15.5)
WBC: 8.1 10*3/uL (ref 4.0–10.5)

## 2016-05-13 LAB — COMPREHENSIVE METABOLIC PANEL
ALBUMIN: 4 g/dL (ref 3.5–5.0)
ALK PHOS: 64 U/L (ref 38–126)
ALT: 30 U/L (ref 17–63)
AST: 31 U/L (ref 15–41)
Anion gap: 9 (ref 5–15)
BILIRUBIN TOTAL: 0.8 mg/dL (ref 0.3–1.2)
BUN: 16 mg/dL (ref 6–20)
CALCIUM: 9.8 mg/dL (ref 8.9–10.3)
CO2: 25 mmol/L (ref 22–32)
CREATININE: 1.01 mg/dL (ref 0.61–1.24)
Chloride: 106 mmol/L (ref 101–111)
GFR calc Af Amer: >60 mL/min (ref >60)
GFR calc non Af Amer: >60 mL/min (ref >60)
GLUCOSE: 113 mg/dL — AB (ref 65–99)
Potassium: 4.4 mmol/L (ref 3.5–5.1)
SODIUM: 140 mmol/L (ref 135–145)
Total Protein: 7 g/dL (ref 6.5–8.1)

## 2016-05-13 LAB — URINALYSIS, ROUTINE W REFLEX MICROSCOPIC
Bilirubin Urine: NEGATIVE
Glucose, UA: NEGATIVE mg/dL
HGB URINE DIPSTICK: NEGATIVE
Ketones, ur: NEGATIVE mg/dL
LEUKOCYTES UA: NEGATIVE
NITRITE: NEGATIVE
PROTEIN: NEGATIVE mg/dL
Specific Gravity, Urine: 1.024 (ref 1.005–1.030)
pH: 5 (ref 5.0–8.0)

## 2016-05-13 LAB — TYPE AND SCREEN
ABO/RH(D): O POS
Antibody Screen: NEGATIVE

## 2016-05-13 LAB — SURGICAL PCR SCREEN
MRSA, PCR: NEGATIVE
STAPHYLOCOCCUS AUREUS: NEGATIVE

## 2016-05-13 LAB — APTT: aPTT: 38 seconds — ABNORMAL HIGH (ref 24–36)

## 2016-05-13 LAB — PROTIME-INR
INR: 1.88
Prothrombin Time: 21.9 seconds — ABNORMAL HIGH (ref 11.4–15.2)

## 2016-05-14 ENCOUNTER — Encounter (HOSPITAL_COMMUNITY): Payer: Self-pay

## 2016-05-14 NOTE — Progress Notes (Signed)
Anesthesia Chart Review:  Pt is a 75 year old male scheduled for R total hip arthroplasty anterior approach on 05/21/2016 with Dorna Leitz, MD.   - PCP is Burnard Bunting, MD - Cardiologist is Cristopher Peru, MD, last office visit 05/05/16 with Chanetta Marshall, NP who cleared pt for surgery.   PMH includes:  PAF, tachy-brady syndrome, pacemaker (Medtronic, implanted 2014), HTN, hyperlipidemia. Never smoker. BMI 28. S/p R TKA 04/19/12.   Medications include: atenolol, simvastatin, coumadin. Last dose coumadin 05/15/16.   Preoperative labs reviewed.  PT 21.9, PTT 38. Will recheck DOS.   CXR 05/13/16: No active cardiopulmonary disease.  EKG 05/05/16: Electronic atrial pacemaker. RBBB.  If no changes, I anticipate pt can proceed with surgery as scheduled.   Willeen Cass, FNP-BC [facility]/Anesthesiology Phone: [phone] 05/14/2016 3:08 PM

## 2016-05-19 ENCOUNTER — Telehealth: Payer: Self-pay | Admitting: Internal Medicine

## 2016-05-19 NOTE — Telephone Encounter (Signed)
Returned call, LMOM.   Advised that they restart warfarin day of surgery or day after, at discretion of surgeon.

## 2016-05-19 NOTE — Telephone Encounter (Signed)
[place] to see what Erasmo Downer wants to do after procedure Friday where he had to come off Coumadin-do you want to see him?

## 2016-05-20 NOTE — H&P (Signed)
TOTAL HIP ADMISSION H&P  Patient is admitted for right total hip arthroplasty.  Subjective:  Chief Complaint: right hip pain  HPI: Richard [name], 76 y.o. male, has a history of pain and functional disability in the right hip(s) due to arthritis and patient has failed non-surgical conservative treatments for greater than 12 weeks to include NSAID's and/or analgesics, corticosteriod injections, flexibility and strengthening excercises, supervised PT with diminished ADL's post treatment, weight reduction as appropriate and activity modification.  Onset of symptoms was gradual starting 5 years ago with gradually worsening course since that time.The patient noted no past surgery on the right hip(s).  Patient currently rates pain in the right hip at 8 out of 10 with activity. Patient has night pain, worsening of pain with activity and weight bearing, trendelenberg gait, pain that interfers with activities of daily living, pain with passive range of motion and joint swelling. Patient has evidence of subchondral cysts, periarticular osteophytes and joint space narrowing by imaging studies. This condition presents safety issues increasing the risk of falls. This patient has had failure of all reasonable conservative care.  There is no current active infection.  Patient Active Problem List   Diagnosis Date Noted  . SVT (supraventricular tachycardia) ([place]) 06/04/2015  . Long term (current) use of anticoagulants 09/28/2012  . Atrial fibrillation ([place]) 09/28/2012  . 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 Hyperlipidemia 06/16/2012  . 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 Chronic anticoagulation, on coumadin for PAF 06/16/2012  . PAF (paroxysmal atrial fibrillation) ([place]) 04/20/2012  . RBBB 04/20/2012  . Osteoarthritis of right knee, S/P Rt TKR 04/19/12 04/19/2012  . HTN (hypertension) 04/19/2012  . Bradycardia,  HR down to 20's at times 04/19/2012   Past Medical History:  Diagnosis Date  . 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 Brady-tachy syndrome ([place])   . Chronic anticoagulation, on coumadin for PAF 06/16/2012  . Clotting disorder (HCC)    hx of DVT  . Dysrhythmia   . H/O cardiovascular stress test    normal, done as a baseline , ? where it was done, 4-5 yrs. ago  . Hyperlipidemia 06/16/2012  . Hypertension   . Pacemaker    Medtronic; implanted 06/15/12  . PAF (paroxysmal atrial fibrillation) ([place])     Past Surgical History:  Procedure Laterality Date  . COLONOSCOPY    . INSERT / REPLACE / REMOVE PACEMAKER  06/15/2012   dual chamber  . KNEE ARTHROPLASTY  04/19/2012   Procedure: COMPUTER ASSISTED TOTAL KNEE ARTHROPLASTY;  Surgeon: Alta Corning, MD;  Location: [place];  Service: Orthopedics;  Laterality: Right;  GENERAL WITH PRE OP FEMORAL NERVE BLOCK  . PERMANENT PACEMAKER INSERTION N/A 06/15/2012   Procedure: PERMANENT PACEMAKER INSERTION;  Surgeon: Sanda Klein, MD;  Location: [place] CATH LAB;  Service: Cardiovascular;  Laterality: N/A;  . TONSILLECTOMY    . TOTAL KNEE ARTHROPLASTY  04/19/2012   RIGHT KNEE    No prescriptions prior to admission.   No Known Allergies  Social History  Substance Use Topics  . Smoking status: Never Smoker  . Smokeless tobacco: Never Used  . Alcohol use Yes     Comment: week- 1-2 drinks, 1-2 beers     Family History  Problem Relation Age of Onset  . Heart disease Father   . Colon cancer Neg Hx   . Esophageal cancer Neg Hx   . Rectal  cancer Neg Hx   . Stomach cancer Neg Hx      ROS ROS: I have reviewed the patient's review of systems thoroughly and there are no positive responses as relates to the HPI. Objective:  Physical Exam  Vital signs in last 24 hours:   Well-developed well-nourished patient in no acute distress. Alert and oriented x3 HEENT:within normal limits Cardiac: Regular rate and rhythm Pulmonary:  Lungs clear to auscultation Abdomen: Soft and nontender.  Normal active bowel sounds  Musculoskeletal: (right hip: Limited range of motion.  Pain with internal rotation.  Neurovascularly intact distally.) Labs: Recent Results (from the past 2160 hour(s))  POCT INR     Status: None   Collection Time: 03/01/16  8:10 AM  Result Value Ref Range   INR 2.1   Implantable device - remote     Status: None   Collection Time: 03/08/16  2:56 PM  Result Value Ref Range   Date Time Interrogation Session [id number]    Pulse Generator Manufacturer MERM    Pulse Gen Model A2DR01 Advisa DR MRI    Pulse Gen Serial Number [id number]    Clinic Name [facility]    Implantable Pulse Generator Type Implantable Pulse Generator    Implantable Pulse Generator Implant Date [id number]    Implantable Lead Manufacturer [facility]    Implantable Lead Model 5086    Implantable Lead Serial Number [id number]    Implantable Lead Implant Date [id number]    Implantable Lead Location [id number]    Implantable Lead Manufacturer [facility]    Implantable Lead Model [id number]    Implantable Lead Serial Number [id number]    Implantable Lead Implant Date [id number]    Implantable Lead Location [id number]    Lead Channel Setting Sensing Sensitivity 0.9 mV   Lead Channel Setting Pacing Amplitude 3 V   Lead Channel Setting Pacing Pulse Width 0.4 ms   Lead Channel Setting Pacing Amplitude 2.5 V   Lead Channel Impedance Value 494 ohm   Lead Channel Impedance Value 437 ohm   Lead Channel Sensing Intrinsic Amplitude 4.875 mV   Lead Channel Sensing Intrinsic Amplitude 4.875 mV   Lead Channel Pacing Threshold Amplitude 1.375 V   Lead Channel Pacing Threshold Pulse Width 0.4 ms   Lead Channel Impedance Value 684 ohm   Lead Channel Impedance Value 551 ohm   Lead Channel Sensing Intrinsic Amplitude 7.25 mV   Lead Channel Sensing Intrinsic Amplitude 7.25 mV   Lead Channel Pacing Threshold Amplitude 0.375 V   Lead Channel Pacing Threshold Pulse Width  0.4 ms   Battery Status OK    Battery Remaining Longevity 50 mo   Battery Voltage 2.99 V   Brady Statistic RA Percent Paced 98.49 %   Brady Statistic RV Percent Paced 0.37 %   Brady Statistic AP VP Percent 0.36 %   Brady Statistic AS VP Percent 0.01 %   Brady Statistic AP VS Percent 98.13 %   Brady Statistic AS VS Percent 1.50 %   Eval Rhythm AP/VS   POCT INR     Status: None   Collection Time: 03/24/16  8:32 AM  Result Value Ref Range   INR 2.5   POCT INR     Status: None   Collection Time: 04/21/16  8:15 AM  Result Value Ref Range   INR 2.2   Implantable device check     Status: None   Collection Time: 05/05/16  5:07 PM  Result Value Ref Range   Pulse Generator Manufacturer MERM  Date Time Interrogation Session [phone]    Pulse Gen Model A2DR01 Advisa DR MRI    Pulse Gen Serial Number [id number] [facility]    Clinic Name [place]    Implantable Pulse Generator Type Implantable Pulse Generator    Implantable Pulse Generator Implant Date [id number]    Implantable Lead Manufacturer [facility]    Implantable Lead Model [id number]    Implantable Lead Serial Number [id number]    Implantable Lead Implant Date [id number]    Implantable Lead Location [id number]    Implantable Lead Manufacturer [facility]    Implantable Lead Model [id number]    Implantable Lead Serial Number [id number]    Implantable Lead Implant Date [id number]    Implantable Lead Location [id number]   Surgical pcr screen     Status: None   Collection Time: 05/13/16  8:39 AM  Result Value Ref Range   MRSA, PCR NEGATIVE NEGATIVE   Staphylococcus aureus NEGATIVE NEGATIVE    Comment:        The Xpert SA Assay (FDA approved for NASAL specimens in patients over 21 years of age), is one component of a comprehensive surveillance program.  Test performance has been validated by [facility] for patients greater than or equal to 2 year old. It is not intended to diagnose infection nor to guide or monitor treatment.   APTT     Status:  Abnormal   Collection Time: 05/13/16  8:40 AM  Result Value Ref Range   aPTT 38 (H) 24 - 36 seconds    Comment:        IF BASELINE aPTT IS ELEVATED, SUGGEST PATIENT RISK ASSESSMENT BE USED TO DETERMINE APPROPRIATE ANTICOAGULANT THERAPY.   CBC WITH DIFFERENTIAL     Status: None   Collection Time: 05/13/16  8:40 AM  Result Value Ref Range   WBC 8.1 4.0 - 10.5 K/uL   RBC 5.08 4.22 - 5.81 MIL/uL   Hemoglobin 16.5 13.0 - 17.0 g/dL   HCT 47.1 39.0 - 52.0 %   MCV 92.7 78.0 - 100.0 fL   MCH 32.5 26.0 - 34.0 pg   MCHC 35.0 30.0 - 36.0 g/dL   RDW 12.2 11.5 - 15.5 %   Platelets 178 150 - 400 K/uL   Neutrophils Relative % 55 %   Neutro Abs 4.5 1.7 - 7.7 K/uL   Lymphocytes Relative 31 %   Lymphs Abs 2.5 0.7 - 4.0 K/uL   Monocytes Relative 8 %   Monocytes Absolute 0.7 0.1 - 1.0 K/uL   Eosinophils Relative 5 %   Eosinophils Absolute 0.4 0.0 - 0.7 K/uL   Basophils Relative 1 %   Basophils Absolute 0.1 0.0 - 0.1 K/uL  Comprehensive metabolic panel     Status: Abnormal   Collection Time: 05/13/16  8:40 AM  Result Value Ref Range   Sodium 140 135 - 145 mmol/L   Potassium 4.4 3.5 - 5.1 mmol/L   Chloride 106 101 - 111 mmol/L   CO2 25 22 - 32 mmol/L   Glucose, Bld 113 (H) 65 - 99 mg/dL   BUN 16 6 - 20 mg/dL   Creatinine, Ser 1.01 0.61 - 1.24 mg/dL   Calcium 9.8 8.9 - 10.3 mg/dL   Total Protein 7.0 6.5 - 8.1 g/dL   Albumin 4.0 3.5 - 5.0 g/dL   AST 31 15 - 41 U/L   ALT 30 17 - 63 U/L   Alkaline Phosphatase 64 38 - 126 U/L   Total Bilirubin 0.8 0.3 - 1.2 mg/dL  GFR calc non Af Amer >60 >60 mL/min   GFR calc Af Amer >60 >60 mL/min    Comment: (NOTE) The eGFR has been calculated using the CKD EPI equation. This calculation has not been validated in all clinical situations. eGFR's persistently <60 mL/min signify possible Chronic Kidney Disease.    Anion gap 9 5 - 15  Protime-INR     Status: Abnormal   Collection Time: 05/13/16  8:40 AM  Result Value Ref Range   Prothrombin Time 21.9  (H) 11.4 - 15.2 seconds   INR 1.88   Urinalysis, Routine w reflex microscopic (not at [facility])     Status: Abnormal   Collection Time: 05/13/16  8:40 AM  Result Value Ref Range   Color, Urine AMBER (A) YELLOW    Comment: BIOCHEMICALS MAY BE AFFECTED BY COLOR   APPearance HAZY (A) CLEAR   Specific Gravity, Urine 1.024 1.005 - 1.030   pH 5.0 5.0 - 8.0   Glucose, UA NEGATIVE NEGATIVE mg/dL   Hgb urine dipstick NEGATIVE NEGATIVE   Bilirubin Urine NEGATIVE NEGATIVE   Ketones, ur NEGATIVE NEGATIVE mg/dL   Protein, ur NEGATIVE NEGATIVE mg/dL   Nitrite NEGATIVE NEGATIVE   Leukocytes, UA NEGATIVE NEGATIVE  Type and screen Order type and screen if day of surgery is less than 15 days from draw of preadmission visit or order morning of surgery if day of surgery is greater than 6 days from preadmission visit.     Status: None   Collection Time: 05/13/16  8:54 AM  Result Value Ref Range   ABO/RH(D) O POS    Antibody Screen NEG    Sample Expiration 05/27/2016    Extend sample reason NO TRANSFUSIONS OR PREGNANCY IN THE PAST 3 MONTHS     Estimated body mass index is 27.64 kg/m as calculated from the following:   Height as of 05/13/16: 5' 10"  (1.778 m).   Weight as of 05/13/16: 87.4 kg (192 lb 9.6 oz).   Imaging Review Plain radiographs demonstrate severe degenerative joint disease of the right hip(s). The bone quality appears to be good for age and reported activity level.  Assessment/Plan:  End stage arthritis, right hip(s)  The patient history, physical examination, clinical judgement of the provider and imaging studies are consistent with end stage degenerative joint disease of the right hip(s) and total hip arthroplasty is deemed medically necessary. The treatment options including medical management, injection therapy, arthroscopy and arthroplasty were discussed at length. The risks and benefits of total hip arthroplasty were presented and reviewed. The risks due to aseptic loosening,  infection, stiffness, dislocation/subluxation,  thromboembolic complications and other imponderables were discussed.  The patient acknowledged the explanation, agreed to proceed with the plan and consent was signed. Patient is being admitted for inpatient treatment for surgery, pain control, PT, OT, prophylactic antibiotics, VTE prophylaxis, progressive ambulation and ADL's and discharge planning.The patient is planning to be discharged home with home health services

## 2016-05-21 ENCOUNTER — Inpatient Hospital Stay (HOSPITAL_COMMUNITY)
Admission: RE | Admit: 2016-05-21 | Discharge: 2016-05-22 | DRG: 470 | Disposition: A | Discharge status: Home Health | Payer: Medicare Other | Source: Ambulatory Visit | Attending: Orthopedic Surgery | Admitting: Orthopedic Surgery

## 2016-05-21 ENCOUNTER — Encounter (HOSPITAL_COMMUNITY)
Admission: RE | Disposition: A | Discharge status: Home Health | Payer: Self-pay | Source: Ambulatory Visit | Attending: Orthopedic Surgery

## 2016-05-21 ENCOUNTER — Inpatient Hospital Stay (HOSPITAL_COMMUNITY): Payer: Medicare Other | Admitting: Certified Registered Nurse Anesthetist

## 2016-05-21 ENCOUNTER — Encounter (HOSPITAL_COMMUNITY): Payer: Self-pay | Admitting: Certified Registered Nurse Anesthetist

## 2016-05-21 ENCOUNTER — Inpatient Hospital Stay (HOSPITAL_COMMUNITY): Payer: Medicare Other

## 2016-05-21 ENCOUNTER — Inpatient Hospital Stay (HOSPITAL_COMMUNITY): Payer: Medicare Other | Admitting: Emergency Medicine

## 2016-05-21 DIAGNOSIS — M1611 Unilateral primary osteoarthritis, right hip: Principal | ICD-10-CM | POA: Diagnosis present

## 2016-05-21 DIAGNOSIS — M25551 Pain in right hip: Secondary | ICD-10-CM | POA: Diagnosis not present

## 2016-05-21 DIAGNOSIS — Z96651 Presence of right artificial knee joint: Secondary | ICD-10-CM | POA: Diagnosis present

## 2016-05-21 DIAGNOSIS — Z950 Presence of cardiac pacemaker: Secondary | ICD-10-CM | POA: Diagnosis not present

## 2016-05-21 DIAGNOSIS — I4891 Unspecified atrial fibrillation: Secondary | ICD-10-CM | POA: Diagnosis not present

## 2016-05-21 DIAGNOSIS — Z7901 Long term (current) use of anticoagulants: Secondary | ICD-10-CM

## 2016-05-21 DIAGNOSIS — I10 Essential (primary) hypertension: Secondary | ICD-10-CM | POA: Diagnosis present

## 2016-05-21 DIAGNOSIS — Z86718 Personal history of other venous thrombosis and embolism: Secondary | ICD-10-CM | POA: Diagnosis not present

## 2016-05-21 DIAGNOSIS — E785 Hyperlipidemia, unspecified: Secondary | ICD-10-CM | POA: Diagnosis present

## 2016-05-21 DIAGNOSIS — Z79899 Other long term (current) drug therapy: Secondary | ICD-10-CM | POA: Diagnosis not present

## 2016-05-21 DIAGNOSIS — I480 Paroxysmal atrial fibrillation: Secondary | ICD-10-CM | POA: Diagnosis present

## 2016-05-21 DIAGNOSIS — Z419 Encounter for procedure for purposes other than remedying health state, unspecified: Secondary | ICD-10-CM

## 2016-05-21 HISTORY — PX: TOTAL HIP ARTHROPLASTY: SHX124

## 2016-05-21 LAB — PROTIME-INR
INR: 1.16
PROTHROMBIN TIME: 14.8 s (ref 11.4–15.2)

## 2016-05-21 LAB — APTT: aPTT: 31 seconds (ref 24–36)

## 2016-05-21 SURGERY — ARTHROPLASTY, HIP, TOTAL, ANTERIOR APPROACH
Anesthesia: Monitor Anesthesia Care | Site: Hip | Laterality: Right

## 2016-05-21 MED ORDER — FENTANYL CITRATE (PF) 100 MCG/2ML IJ SOLN
INTRAMUSCULAR | Status: DC | PRN
  Administered 2016-05-21: 100 ug via INTRAVENOUS

## 2016-05-21 MED ORDER — ONDANSETRON HCL 4 MG PO TABS: 4.0000 mg | ORAL_TABLET | Freq: Four times a day (QID) | ORAL | Status: DC | PRN

## 2016-05-21 MED ORDER — WARFARIN - PHYSICIAN DOSING INPATIENT: Freq: Every day | Status: DC

## 2016-05-21 MED ORDER — PROPOFOL 10 MG/ML IV BOLUS
INTRAVENOUS | Status: DC | PRN
  Administered 2016-05-21: 35 mg via INTRAVENOUS

## 2016-05-21 MED ORDER — ATENOLOL 50 MG PO TABS
25.0000 mg | ORAL_TABLET | Freq: Every day | ORAL | Status: DC
  Administered 2016-05-21: 25 mg via ORAL
  Filled 2016-05-21: qty 1

## 2016-05-21 MED ORDER — BISACODYL 5 MG PO TBEC: 5.0000 mg | DELAYED_RELEASE_TABLET | Freq: Every day | ORAL | Status: DC | PRN

## 2016-05-21 MED ORDER — POLYETHYLENE GLYCOL 3350 17 G PO PACK: 17.0000 g | PACK | Freq: Every day | ORAL | Status: DC | PRN

## 2016-05-21 MED ORDER — BUPIVACAINE HCL 0.5 % IJ SOLN
INTRAMUSCULAR | Status: DC | PRN
  Administered 2016-05-21: 30 mL

## 2016-05-21 MED ORDER — DOCUSATE SODIUM 100 MG PO CAPS: 100.0000 mg | ORAL_CAPSULE | Freq: Two times a day (BID) | ORAL | 0 refills | Status: DC

## 2016-05-21 MED ORDER — ALUM & MAG HYDROXIDE-SIMETH 200-200-20 MG/5ML PO SUSP: 30.0000 mL | ORAL | Status: DC | PRN

## 2016-05-21 MED ORDER — CELECOXIB 200 MG PO CAPS
200.0000 mg | ORAL_CAPSULE | Freq: Two times a day (BID) | ORAL | Status: AC
  Administered 2016-05-21 (×2): 200 mg via ORAL
  Filled 2016-05-21 (×2): qty 1

## 2016-05-21 MED ORDER — CEFAZOLIN SODIUM-DEXTROSE 2-3 GM-% IV SOLR
INTRAVENOUS | Status: DC | PRN
  Administered 2016-05-21: 2 g via INTRAVENOUS

## 2016-05-21 MED ORDER — PHENYLEPHRINE HCL 10 MG/ML IJ SOLN
INTRAMUSCULAR | Status: DC | PRN
  Administered 2016-05-21: 120 ug via INTRAVENOUS
  Administered 2016-05-21: 80 ug via INTRAVENOUS

## 2016-05-21 MED ORDER — EPHEDRINE SULFATE 50 MG/ML IJ SOLN
INTRAMUSCULAR | Status: DC | PRN
  Administered 2016-05-21 (×2): 5 mg via INTRAVENOUS

## 2016-05-21 MED ORDER — FENTANYL CITRATE (PF) 100 MCG/2ML IJ SOLN
INTRAMUSCULAR | Status: AC
  Filled 2016-05-21: qty 2

## 2016-05-21 MED ORDER — CEFAZOLIN SODIUM-DEXTROSE 2-4 GM/100ML-% IV SOLN
2.0000 g | Freq: Four times a day (QID) | INTRAVENOUS | Status: AC
  Administered 2016-05-21 – 2016-05-22 (×2): 2 g via INTRAVENOUS
  Filled 2016-05-21 (×2): qty 100

## 2016-05-21 MED ORDER — OXYCODONE-ACETAMINOPHEN 5-325 MG PO TABS: 1.0000 | ORAL_TABLET | Freq: Four times a day (QID) | ORAL | 0 refills | Status: DC | PRN

## 2016-05-21 MED ORDER — ZOLPIDEM TARTRATE 5 MG PO TABS: 5.0000 mg | ORAL_TABLET | Freq: Every evening | ORAL | Status: DC | PRN

## 2016-05-21 MED ORDER — SODIUM CHLORIDE 0.9 % IV SOLN
INTRAVENOUS | Status: DC
  Administered 2016-05-21: 19:00:00 via INTRAVENOUS

## 2016-05-21 MED ORDER — DEXAMETHASONE SODIUM PHOSPHATE 10 MG/ML IJ SOLN
10.0000 mg | Freq: Two times a day (BID) | INTRAMUSCULAR | Status: AC
  Administered 2016-05-21 – 2016-05-22 (×3): 10 mg via INTRAVENOUS
  Filled 2016-05-21 (×3): qty 1

## 2016-05-21 MED ORDER — ALBUMIN HUMAN 5 % IV SOLN
INTRAVENOUS | Status: DC | PRN
  Administered 2016-05-21: 11:00:00 via INTRAVENOUS

## 2016-05-21 MED ORDER — HYDROMORPHONE HCL 2 MG/ML IJ SOLN: 0.5000 mg | INTRAMUSCULAR | Status: DC | PRN

## 2016-05-21 MED ORDER — HYDROMORPHONE HCL 1 MG/ML IJ SOLN: 0.2500 mg | INTRAMUSCULAR | Status: DC | PRN

## 2016-05-21 MED ORDER — 0.9 % SODIUM CHLORIDE (POUR BTL) OPTIME
TOPICAL | Status: DC | PRN
  Administered 2016-05-21: 1000 mL

## 2016-05-21 MED ORDER — BUPIVACAINE IN DEXTROSE 0.75-8.25 % IT SOLN
INTRATHECAL | Status: DC | PRN
  Administered 2016-05-21: 2 mL via INTRATHECAL

## 2016-05-21 MED ORDER — DOCUSATE SODIUM 100 MG PO CAPS
100.0000 mg | ORAL_CAPSULE | Freq: Two times a day (BID) | ORAL | Status: DC
Start: 2016-05-21 — End: 2016-05-22
  Administered 2016-05-21 – 2016-05-22 (×2): 100 mg via ORAL
  Filled 2016-05-21 (×3): qty 1

## 2016-05-21 MED ORDER — SIMVASTATIN 40 MG PO TABS
40.0000 mg | ORAL_TABLET | Freq: Every day | ORAL | Status: DC
  Administered 2016-05-21: 40 mg via ORAL
  Filled 2016-05-21: qty 1

## 2016-05-21 MED ORDER — ACETAMINOPHEN 650 MG RE SUPP: 650.0000 mg | Freq: Four times a day (QID) | RECTAL | Status: DC | PRN

## 2016-05-21 MED ORDER — TRANEXAMIC ACID 1000 MG/10ML IV SOLN
2000.0000 mg | INTRAVENOUS | Status: AC
  Administered 2016-05-21: 2000 mg via TOPICAL
  Filled 2016-05-21: qty 20

## 2016-05-21 MED ORDER — MAGNESIUM CITRATE PO SOLN: 1.0000 | Freq: Once | ORAL | Status: DC | PRN

## 2016-05-21 MED ORDER — DIPHENHYDRAMINE HCL 12.5 MG/5ML PO ELIX: 12.5000 mg | ORAL_SOLUTION | ORAL | Status: DC | PRN

## 2016-05-21 MED ORDER — PROPOFOL 500 MG/50ML IV EMUL
INTRAVENOUS | Status: DC | PRN
  Administered 2016-05-21: 50 ug/kg/min via INTRAVENOUS

## 2016-05-21 MED ORDER — DEXTROSE 5 % IV SOLN
500.0000 mg | Freq: Four times a day (QID) | INTRAVENOUS | Status: DC | PRN
  Filled 2016-05-21: qty 5

## 2016-05-21 MED ORDER — GABAPENTIN 300 MG PO CAPS
300.0000 mg | ORAL_CAPSULE | Freq: Two times a day (BID) | ORAL | Status: DC
  Administered 2016-05-21 – 2016-05-22 (×3): 300 mg via ORAL
  Filled 2016-05-21 (×3): qty 1

## 2016-05-21 MED ORDER — LACTATED RINGERS IV SOLN
INTRAVENOUS | Status: DC
  Administered 2016-05-21: 12:00:00 via INTRAVENOUS
  Administered 2016-05-21: 50 mL/h via INTRAVENOUS

## 2016-05-21 MED ORDER — PHENYLEPHRINE HCL 10 MG/ML IJ SOLN
INTRAMUSCULAR | Status: DC | PRN
  Administered 2016-05-21: 15 ug/min via INTRAVENOUS

## 2016-05-21 MED ORDER — TIZANIDINE HCL 2 MG PO TABS: 2.0000 mg | ORAL_TABLET | Freq: Three times a day (TID) | ORAL | 0 refills | Status: DC | PRN

## 2016-05-21 MED ORDER — MIDAZOLAM HCL 2 MG/2ML IJ SOLN
INTRAMUSCULAR | Status: AC
  Filled 2016-05-21: qty 2

## 2016-05-21 MED ORDER — ACETAMINOPHEN 325 MG PO TABS: 650.0000 mg | ORAL_TABLET | Freq: Four times a day (QID) | ORAL | Status: DC | PRN

## 2016-05-21 MED ORDER — BUPIVACAINE LIPOSOME 1.3 % IJ SUSP
20.0000 mL | INTRAMUSCULAR | Status: AC
  Administered 2016-05-21: 20 mL
  Filled 2016-05-21: qty 20

## 2016-05-21 MED ORDER — OXYCODONE HCL 5 MG PO TABS
5.0000 mg | ORAL_TABLET | ORAL | Status: DC | PRN
  Administered 2016-05-21 – 2016-05-22 (×5): 10 mg via ORAL
  Filled 2016-05-21 (×5): qty 2

## 2016-05-21 MED ORDER — BUPIVACAINE HCL (PF) 0.5 % IJ SOLN
INTRAMUSCULAR | Status: AC
  Filled 2016-05-21: qty 30

## 2016-05-21 MED ORDER — SIMVASTATIN 40 MG PO TABS
40.0000 mg | ORAL_TABLET | Freq: Every day | ORAL | Status: DC
  Filled 2016-05-21: qty 1

## 2016-05-21 MED ORDER — ATENOLOL 50 MG PO TABS
25.0000 mg | ORAL_TABLET | Freq: Every day | ORAL | Status: DC
  Filled 2016-05-21: qty 1

## 2016-05-21 MED ORDER — METHOCARBAMOL 500 MG PO TABS
500.0000 mg | ORAL_TABLET | Freq: Four times a day (QID) | ORAL | Status: DC | PRN
  Administered 2016-05-21 – 2016-05-22 (×2): 500 mg via ORAL
  Filled 2016-05-21 (×4): qty 1

## 2016-05-21 MED ORDER — MIDAZOLAM HCL 5 MG/5ML IJ SOLN
INTRAMUSCULAR | Status: DC | PRN
  Administered 2016-05-21: 2 mg via INTRAVENOUS

## 2016-05-21 MED ORDER — PROMETHAZINE HCL 25 MG/ML IJ SOLN: 6.2500 mg | INTRAMUSCULAR | Status: DC | PRN

## 2016-05-21 MED ORDER — ONDANSETRON HCL 4 MG/2ML IJ SOLN: 4.0000 mg | Freq: Four times a day (QID) | INTRAMUSCULAR | Status: DC | PRN

## 2016-05-21 MED ORDER — CEFAZOLIN SODIUM-DEXTROSE 2-4 GM/100ML-% IV SOLN
INTRAVENOUS | Status: AC
  Filled 2016-05-21: qty 100

## 2016-05-21 MED ORDER — WARFARIN SODIUM 5 MG PO TABS
5.0000 mg | ORAL_TABLET | Freq: Once | ORAL | Status: AC
  Administered 2016-05-21: 5 mg via ORAL
  Filled 2016-05-21: qty 1

## 2016-05-21 SURGICAL SUPPLY — 58 items
BENZOIN TINCTURE PRP APPL 2/3 (GAUZE/BANDAGES/DRESSINGS) ×3 IMPLANT
BLADE SAW SGTL 18X1.27X75 (BLADE) ×2 IMPLANT
BLADE SAW SGTL 18X1.27X75MM (BLADE) ×1
BLADE SURG ROTATE 9660 (MISCELLANEOUS) IMPLANT
BNDG COHESIVE 6X5 TAN STRL LF (GAUZE/BANDAGES/DRESSINGS) IMPLANT
BNDG GAUZE ELAST 4 BULKY (GAUZE/BANDAGES/DRESSINGS) IMPLANT
CAPT HIP TOTAL 2 ×3 IMPLANT
CELLS DAT CNTRL 66122 CELL SVR (MISCELLANEOUS) IMPLANT
CLOSURE STERI-STRIP 1/2X4 (GAUZE/BANDAGES/DRESSINGS) ×1
CLSR STERI-STRIP ANTIMIC 1/2X4 (GAUZE/BANDAGES/DRESSINGS) ×2 IMPLANT
COVER PERINEAL POST (MISCELLANEOUS) ×3 IMPLANT
COVER SURGICAL LIGHT HANDLE (MISCELLANEOUS) ×3 IMPLANT
DRAPE C-ARM 42X72 X-RAY (DRAPES) ×3 IMPLANT
DRAPE STERI IOBAN 125X83 (DRAPES) ×3 IMPLANT
DRAPE U-SHAPE 47X51 STRL (DRAPES) ×6 IMPLANT
DRSG AQUACEL AG ADV 3.5X10 (GAUZE/BANDAGES/DRESSINGS) ×3 IMPLANT
DURAPREP 26ML APPLICATOR (WOUND CARE) ×3 IMPLANT
ELECT BLADE 4.0 EZ CLEAN MEGAD (MISCELLANEOUS)
ELECT CAUTERY BLADE 6.4 (BLADE) ×3 IMPLANT
ELECT REM PT RETURN 9FT ADLT (ELECTROSURGICAL) ×3
ELECTRODE BLDE 4.0 EZ CLN MEGD (MISCELLANEOUS) IMPLANT
ELECTRODE REM PT RTRN 9FT ADLT (ELECTROSURGICAL) ×1 IMPLANT
GAUZE XEROFORM 1X8 LF (GAUZE/BANDAGES/DRESSINGS) ×3 IMPLANT
GLOVE BIOGEL PI IND STRL 8 (GLOVE) ×2 IMPLANT
GLOVE BIOGEL PI INDICATOR 8 (GLOVE) ×4
GLOVE ECLIPSE 7.5 STRL STRAW (GLOVE) ×6 IMPLANT
GOWN STRL REUS W/ TWL LRG LVL3 (GOWN DISPOSABLE) ×2 IMPLANT
GOWN STRL REUS W/ TWL XL LVL3 (GOWN DISPOSABLE) ×2 IMPLANT
GOWN STRL REUS W/TWL LRG LVL3 (GOWN DISPOSABLE) ×4
GOWN STRL REUS W/TWL XL LVL3 (GOWN DISPOSABLE) ×4
HOOD PEEL AWAY FACE SHEILD DIS (HOOD) ×6 IMPLANT
KIT BASIN OR (CUSTOM PROCEDURE TRAY) ×3 IMPLANT
KIT ROOM TURNOVER OR (KITS) ×3 IMPLANT
MANIFOLD NEPTUNE II (INSTRUMENTS) ×3 IMPLANT
NEEDLE SPNL 22GX3.5 QUINCKE BK (NEEDLE) ×3 IMPLANT
NS IRRIG 1000ML POUR BTL (IV SOLUTION) ×3 IMPLANT
PACK TOTAL JOINT (CUSTOM PROCEDURE TRAY) ×3 IMPLANT
PACK UNIVERSAL I (CUSTOM PROCEDURE TRAY) ×3 IMPLANT
PAD ARMBOARD 7.5X6 YLW CONV (MISCELLANEOUS) ×6 IMPLANT
RTRCTR WOUND ALEXIS 18CM MED (MISCELLANEOUS)
RTRCTR WOUND ALEXIS 18CM SML (INSTRUMENTS) ×3
SAVER CELL AAL HAEMONETICS (INSTRUMENTS) ×1 IMPLANT
SPONGE LAP 18X18 X RAY DECT (DISPOSABLE) IMPLANT
STAPLER VISISTAT 35W (STAPLE) IMPLANT
SUT ETHIBOND NAB CT1 #1 30IN (SUTURE) ×6 IMPLANT
SUT MNCRL AB 3-0 PS2 18 (SUTURE) IMPLANT
SUT VIC AB 0 CT1 27 (SUTURE) ×2
SUT VIC AB 0 CT1 27XBRD ANBCTR (SUTURE) ×1 IMPLANT
SUT VIC AB 1 CT1 27 (SUTURE) ×4
SUT VIC AB 1 CT1 27XBRD ANBCTR (SUTURE) ×2 IMPLANT
SUT VIC AB 2-0 CT1 27 (SUTURE) ×2
SUT VIC AB 2-0 CT1 TAPERPNT 27 (SUTURE) ×1 IMPLANT
SYR 50ML LL SCALE MARK (SYRINGE) ×3 IMPLANT
TOWEL OR 17X24 6PK STRL BLUE (TOWEL DISPOSABLE) ×3 IMPLANT
TOWEL OR 17X26 10 PK STRL BLUE (TOWEL DISPOSABLE) ×3 IMPLANT
TRAY CATH 16FR W/PLASTIC CATH (SET/KITS/TRAYS/PACK) ×3 IMPLANT
TRAY FOLEY CATH 16FR SILVER (SET/KITS/TRAYS/PACK) IMPLANT
WATER STERILE IRR 1000ML POUR (IV SOLUTION) ×6 IMPLANT

## 2016-05-21 NOTE — Discharge Instructions (Signed)

## 2016-05-21 NOTE — Anesthesia Preprocedure Evaluation (Signed)
Anesthesia Evaluation  Patient identified by MRN, date of birth, ID band Patient awake    Reviewed: Allergy & Precautions, NPO status , Patient's Chart, lab work & pertinent test results  Airway Mallampati: II  TM Distance: >3 FB Neck ROM: Full    Dental no notable dental hx.    Pulmonary neg pulmonary ROS,    Pulmonary exam normal breath sounds clear to auscultation       Cardiovascular hypertension, Pt. on medications Normal cardiovascular exam+ dysrhythmias Atrial Fibrillation + pacemaker  Rhythm:Regular Rate:Normal     Neuro/Psych negative neurological ROS  negative psych ROS   GI/Hepatic negative GI ROS, Neg liver ROS,   Endo/Other  negative endocrine ROS  Renal/GU negative Renal ROS  negative genitourinary   Musculoskeletal negative musculoskeletal ROS (+)   Abdominal   Peds negative pediatric ROS (+)  Hematology negative hematology ROS (+)   Anesthesia Other Findings   Reproductive/Obstetrics negative OB ROS                             Anesthesia Physical Anesthesia Plan  ASA: III  Anesthesia Plan: Spinal   Post-op Pain Management:    Induction: Intravenous  Airway Management Planned: Simple Face Mask  Additional Equipment:   Intra-op Plan:   Post-operative Plan: Extubation in OR  Informed Consent: I have reviewed the patients History and Physical, chart, labs and discussed the procedure including the risks, benefits and alternatives for the proposed anesthesia with the patient or authorized representative who has indicated his/her understanding and acceptance.   Dental advisory given  Plan Discussed with: CRNA  Anesthesia Plan Comments: (GA if INR elevated)        Anesthesia Quick Evaluation

## 2016-05-21 NOTE — Care Management Note (Signed)
Case Management Note  Patient Details  Name: Richard [name] MRN: [id number] Date of Birth: 01/23/1941  Subjective/Objective:    76 yr old gentleman s/p left total Hip arthroplasty.                Action/Plan:   Patient preoperatively setup with [facility], just arrived on unit. CM will follow. Patient should have RW and 3in1.   Expected Discharge Date:    05/22/16              Expected Discharge Plan:  [place]  In-House Referral:  NA  Discharge planning Services  CM Consult  Post Acute Care Choice:    Choice offered to:     DME Arranged:    DME Agency:     HH Arranged:  RN, PT [place] Agency:  [facility] (formerly [employer])  Status of Service:  In process, will continue to follow  If discussed at Long Length of Stay Meetings, dates discussed:    Additional Comments:  Ninfa Meeker, RN 05/21/2016, 2:37 PM

## 2016-05-21 NOTE — Transfer of Care (Signed)
Immediate Anesthesia Transfer of Care Note  Patient: Richard [name]  Procedure(s) Performed: Procedure(s): TOTAL HIP ARTHROPLASTY ANTERIOR APPROACH (Right)  Patient Location: PACU  Anesthesia Type:MAC and Spinal  Level of Consciousness: awake, alert , oriented and patient cooperative  Airway & Oxygen Therapy: Patient Spontanous Breathing and Patient connected to face mask oxygen  Post-op Assessment: Report given to RN and Post -op Vital signs reviewed and stable  Post vital signs: Reviewed and stable  Last Vitals:  Vitals:   05/21/16 0849  BP: (!) 153/76  Pulse: 66  Resp: 18  Temp: 36.4 C    Last Pain:  Vitals:   05/21/16 0929  TempSrc:   PainSc: 0-No pain      Patients Stated Pain Goal: 3 (28/00/34 9179)  Complications: No apparent anesthesia complications

## 2016-05-21 NOTE — Brief Op Note (Signed)
05/21/2016  1:21 PM  PATIENT:  Richard [name]  76 y.o. male  PRE-OPERATIVE DIAGNOSIS:  OSTEOARTHRITIS RIGHT HIP  POST-OPERATIVE DIAGNOSIS:  OSTEOARTHRITIS RIGHT HIP  PROCEDURE:  Procedure(s): TOTAL HIP ARTHROPLASTY ANTERIOR APPROACH (Right)  SURGEON:  Surgeon(s) and Role:    * Dorna Leitz, MD - Primary  PHYSICIAN ASSISTANT:   ASSISTANTS: bethune   ANESTHESIA:   spinal  EBL:  Total I/O In: 1250 [I.V.:1000; IV Piggyback:250] Out: 500 [Urine:200; Blood:300]  BLOOD ADMINISTERED:none  DRAINS: none   LOCAL MEDICATIONS USED:  MARCAINE    and OTHER experel  SPECIMEN:  No Specimen  DISPOSITION OF SPECIMEN:  N/A  COUNTS:  YES  TOURNIQUET:  * No tourniquets in log *  DICTATION: .Other Dictation: Dictation Number [id number]  PLAN OF CARE: Admit to inpatient   PATIENT DISPOSITION:  PACU - hemodynamically stable.   Delay start of Pharmacological VTE agent (>24hrs) due to surgical blood loss or risk of bleeding: no

## 2016-05-21 NOTE — Progress Notes (Signed)
Report given to robin roberts rn as caregiver 

## 2016-05-21 NOTE — Anesthesia Procedure Notes (Signed)
Spinal  Patient location during procedure: OR Start time: 05/21/2016 10:11 AM End time: 05/21/2016 10:11 AM Staffing Anesthesiologist: Zamere Pasternak, Iona Beard Performed: anesthesiologist  Preanesthetic Checklist Completed: patient identified, site marked, surgical consent, pre-op evaluation, timeout performed, IV checked, risks and benefits discussed and monitors and equipment checked Spinal Block Patient position: sitting Prep: Betadine Patient monitoring: heart rate, continuous pulse ox and blood pressure Approach: midline Injection technique: single-shot Needle Needle type: Sprotte  Needle gauge: 24 G Needle length: 9 cm Additional Notes Expiration date of kit checked and confirmed. Patient tolerated procedure well, without complications.

## 2016-05-21 NOTE — Evaluation (Signed)
Physical Therapy Evaluation Patient Details Name: Richard [name] MRN: [id number] DOB: 25-Mar-1941 Today's Date: 05/21/2016   History of Present Illness  76 y.o. male s/p right THA direct anterior approach. Hx of SVT, RBBB, afib, Chronic venous embolism and thrombosis of deep veins of upper extremity, S/P cardiac pacemaker procedure, Bradycardia, HR down to 20's at times, and Chronic anticoagulation  Clinical Impression  Patient is s/p above procedure, presenting with functional limitations due to the deficits listed below (see PT Problem List). Doing great post-op day #0. Ambulates 75 feet with minimal use of rolling walker, and no notable loss of balance or buckling of LE. Mild pain at present, RN notified request for pain medication. Will need to navigate 2 steps to enter his home. Anticipate quick recovery. Wife present to care for patient at home. Patient will benefit from skilled PT to increase their independence and safety with mobility to allow discharge to the venue listed below.       Follow Up Recommendations Home health PT    Equipment Recommendations  None recommended by PT    Recommendations for Other Services       Precautions / Restrictions Precautions Precautions: None Restrictions Weight Bearing Restrictions: Yes RLE Weight Bearing: Weight bearing as tolerated      Mobility  Bed Mobility Overal bed mobility: Modified Independent             General bed mobility comments: Extra time, no assist  Transfers Overall transfer level: Needs assistance Equipment used: Rolling walker (2 wheeled) Transfers: Sit to/from Stand Sit to Stand: Supervision         General transfer comment: Supervision for safety first time getting out of bed. Cues for walker use and hand placement to rise, denies dizziness. From recliner pt did not require UE support on RW.  Ambulation/Gait Ambulation/Gait assistance: Supervision Ambulation Distance (Feet): 75 Feet Assistive  device: Rolling walker (2 wheeled) Gait Pattern/deviations: Step-through pattern;Decreased stance time - right;Decreased stride length Gait velocity: decreased Gait velocity interpretation: Below normal speed for age/gender General Gait Details: Minimally antalgic. Very light use of RW for support with cues for technique. No loss of balance or buckling noted.  Stairs            Wheelchair Mobility    Modified Rankin (Stroke Patients Only)       Balance Overall balance assessment: Needs assistance Sitting-balance support: Feet supported;No upper extremity supported Sitting balance-Leahy Scale: Normal     Standing balance support: No upper extremity supported Standing balance-Leahy Scale: Good                               Pertinent Vitals/Pain Pain Assessment: Faces Faces Pain Scale: Hurts a little bit Pain Location: Lt hip Pain Descriptors / Indicators: Dull Pain Intervention(s): Monitored during session;Repositioned;Limited activity within patient's tolerance    Home Living Family/patient expects to be discharged to:: Private residence Living Arrangements: Spouse/significant other Available Help at Discharge: Family;Available 24 hours/day Type of Home: House Home Access: Stairs to enter Entrance Stairs-Rails: None Entrance Stairs-Number of Steps: 2 Home Layout: One level Home Equipment: Walker - 2 wheels;Bedside commode      Prior Function Level of Independence: Independent         Comments: Active, swam for exercise, golfing.     Hand Dominance   Dominant Hand: Right    Extremity/Trunk Assessment   Upper Extremity Assessment Upper Extremity Assessment: Defer to OT evaluation  Lower Extremity Assessment Lower Extremity Assessment: RLE deficits/detail RLE Deficits / Details: Mildly guarded, no signficant concerns post-op       Communication   Communication: No difficulties  Cognition Arousal/Alertness: Awake/alert Behavior  During Therapy: WFL for tasks assessed/performed Overall Cognitive Status: Within Functional Limits for tasks assessed                      General Comments General comments (skin integrity, edema, etc.): HR 61, SpO2 98% on room air.    Exercises Total Joint Exercises Ankle Circles/Pumps: AROM;Both;10 reps;Seated Quad Sets: Strengthening;Both;10 reps;Seated Long Arc Quad: Strengthening;Right;10 reps;Seated Marching in Standing: Strengthening;Both;10 reps;Seated   Assessment/Plan    PT Assessment Patient needs continued PT services  PT Problem List Decreased strength;Decreased range of motion;Decreased activity tolerance;Decreased balance;Decreased mobility;Pain          PT Treatment Interventions DME instruction;Gait training;Stair training;Functional mobility training;Therapeutic activities;Therapeutic exercise;Balance training;Neuromuscular re-education;Patient/family education    PT Goals (Current goals can be found in the Care Plan section)  Acute Rehab PT Goals Patient Stated Goal: Get back to golfing PT Goal Formulation: With patient Time For Goal Achievement: 05/28/16 Potential to Achieve Goals: Good    Frequency 7X/week   Barriers to discharge        Co-evaluation               End of Session   Activity Tolerance: Patient tolerated treatment well Patient left: in chair;with call bell/phone within reach;with chair alarm set Nurse Communication: Mobility status;Patient requests pain meds         Time: [id number] PT Time Calculation (min) (ACUTE ONLY): 23 min   Charges:   PT Evaluation $PT Eval Low Complexity: 1 Procedure PT Treatments $Gait Training: 8-22 mins   PT G Codes:        Ellouise Newer 05-30-16, 4:34 PM  Elayne Snare, PT

## 2016-05-22 LAB — PROTIME-INR
INR: 1.47
PROTHROMBIN TIME: 17.9 s — AB (ref 11.4–15.2)

## 2016-05-22 LAB — CBC
HCT: 40.4 % (ref 39.0–52.0)
HEMOGLOBIN: 14.3 g/dL (ref 13.0–17.0)
MCH: 32.9 pg (ref 26.0–34.0)
MCHC: 35.4 g/dL (ref 30.0–36.0)
MCV: 92.9 fL (ref 78.0–100.0)
PLATELETS: 157 10*3/uL (ref 150–400)
RBC: 4.35 MIL/uL (ref 4.22–5.81)
RDW: 12 % (ref 11.5–15.5)
WBC: 11.3 10*3/uL — ABNORMAL HIGH (ref 4.0–10.5)

## 2016-05-22 LAB — BASIC METABOLIC PANEL
Anion gap: 7 (ref 5–15)
BUN: 13 mg/dL (ref 6–20)
CHLORIDE: 105 mmol/L (ref 101–111)
CO2: 25 mmol/L (ref 22–32)
CREATININE: 1 mg/dL (ref 0.61–1.24)
Calcium: 8.8 mg/dL — ABNORMAL LOW (ref 8.9–10.3)
Glucose, Bld: 152 mg/dL — ABNORMAL HIGH (ref 65–99)
POTASSIUM: 5.1 mmol/L (ref 3.5–5.1)
SODIUM: 137 mmol/L (ref 135–145)

## 2016-05-22 NOTE — Progress Notes (Signed)
Physical Therapy Treatment Patient Details Name: Richard [name] MRN: [place]:[id number] DOB: 09-19-1940 Today's Date: 05/22/2016    History of Present Illness 76 y.o. male s/p right THA direct anterior approach. 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  Initially used walker, but advanced to no device for gait including stairs without assistance.  Education for pacing of activity and general advancement of exercises with consideration for pain.  Patient is able to DC home when cleared by medical.   Follow Up Recommendations  Home health PT     Equipment Recommendations  None recommended by PT    Recommendations for Other Services       Precautions / Restrictions Precautions Precautions: None Restrictions Weight Bearing Restrictions: Yes RLE Weight Bearing: Weight bearing as tolerated    Mobility  Bed Mobility Overal bed mobility: Modified Independent             General bed mobility comments: Head of bed slightly elevated, no rails or assist  Transfers Overall transfer level: Needs assistance Equipment used: Rolling walker (2 wheeled);None Transfers: Sit to/from Stand Sit to Stand: Modified independent (Device/Increase time) (for IV pole/line)         General transfer comment: Management of IV pole and line  Ambulation/Gait Ambulation/Gait assistance: Modified independent (Device/Increase time);Independent Ambulation Distance (Feet): 400 Feet Assistive device: Rolling walker (2 wheeled);None (Started with walker, no device last half of gait.) Gait Pattern/deviations: WFL(Within Functional Limits)   Gait velocity interpretation: at or above normal speed for age/gender General Gait Details: Minimally antalgic. Very light use of RW for support with cues  for technique. No loss of balance or buckling noted.   Stairs Stairs: Yes   Stair Management: No rails;One rail Right Number of Stairs: 4 General stair comments: With or without rails, can use step over technique.  Wheelchair Mobility    Modified Rankin (Stroke Patients Only)       Balance Overall balance assessment: Independent Sitting-balance support: Feet supported;No upper extremity supported Sitting balance-Leahy Scale: Normal     Standing balance support: No upper extremity supported Standing balance-Leahy Scale: Good                      Cognition Arousal/Alertness: Awake/alert Behavior During Therapy: WFL for tasks assessed/performed Overall Cognitive Status: Within Functional Limits for tasks assessed                      Exercises      General Comments        Pertinent Vitals/Pain Pain Assessment: 0-10 Pain Score: 0-No pain Pain Location: Left hip Pain Descriptors / Indicators: Tightness    Home Living                      Prior Function            PT Goals (current goals can now be found in the care plan section) Acute Rehab PT Goals Patient Stated Goal: Get back to golfing PT Goal Formulation: With patient Time For Goal Achievement: 05/28/16 Potential to Achieve Goals: Good Progress towards PT goals: Progressing toward goals    Frequency    7X/week      PT Plan Current plan remains appropriate    Co-evaluation  End of Session Equipment Utilized During Treatment: Gait belt Activity Tolerance: Patient tolerated treatment well Patient left: in chair;with call bell/phone within reach     Time: 0945-1010 PT Time Calculation (min) (ACUTE ONLY): 25 min  Charges:  $Gait Training: 8-22 mins $Therapeutic Activity: 8-22 mins                    G Codes:      Richard [name] L 2016/05/28, 10:20 AM

## 2016-05-22 NOTE — Op Note (Deleted)
  The note originally documented on this encounter has been moved the the encounter in which it belongs.  

## 2016-05-22 NOTE — Progress Notes (Signed)
Pt ready for discharge. Education/instructions reviewed with pt and all questions/concerns addressed. IV removed and belongings gathered. Pt will be transported out via wheelchair to wife's vehicle. Will continue to monitor

## 2016-05-22 NOTE — Progress Notes (Signed)
    Patient doing well PO day 1 S/P R ant THR per [facility] Team. Denies pain, has been up and walking to bathroom and chair, no PT/OT yet, eating and drinking well with good and NL B/B function. Pt very pleased with progress.   Physical Exam: Vitals:   05/22/16 0045 05/22/16 0632  BP: (!) 95/41 (!) 104/55  Pulse: 61 61  Resp: 15 16  Temp: 98.2 F (36.8 C) 97.8 F (36.6 C)   CBC Latest Ref Rng & Units 05/22/2016 05/13/2016 04/22/2012  WBC 4.0 - 10.5 K/uL 11.3(H) 8.1 10.7(H)  Hemoglobin 13.0 - 17.0 g/dL 14.3 16.5 11.9(L)  Hematocrit 39.0 - 52.0 % 40.4 47.1 34.6(L)  Platelets 150 - 400 K/uL 157 178 165   CMP Latest Ref Rng & Units 05/22/2016 05/13/2016 04/21/2012  Glucose 65 - 99 mg/dL 152(H) 113(H) 111(H)  BUN 6 - 20 mg/dL 13 16 13   Creatinine 0.61 - 1.24 mg/dL 1.00 1.01 1.04  Sodium 135 - 145 mmol/L 137 140 138  Potassium 3.5 - 5.1 mmol/L 5.1 4.4 4.0  Chloride 101 - 111 mmol/L 105 106 102  CO2 22 - 32 mmol/L 25 25 28   Calcium 8.9 - 10.3 mg/dL 8.8(L) 9.8 8.7  Total Protein 6.5 - 8.1 g/dL - 7.0 -  Total Bilirubin 0.3 - 1.2 mg/dL - 0.8 -  Alkaline Phos 38 - 126 U/L - 64 -  AST 15 - 41 U/L - 31 -  ALT 17 - 63 U/L - 30 -   PT 17.9, INR 1.47  Dressing in place, CDI, distal extremities soft/NT well perfused, SCD's in place NVI  POD #1 s/p R THR Ant approach per [facility]   - up with PT/OT, encourage ambulation - Percocet for pain, Tizanidine for muscle spasms -Coumadin 5mg  this am then resume home dosage  - likely d/c home today after PT/OT

## 2016-05-22 NOTE — Op Note (Signed)
NAME:  AADHI, SMYSER NO.:  [id number]  MEDICAL RECORD NO.:  [id number]  LOCATION:                                 FACILITY:  PHYSICIAN:  Alta Corning, M.D.   DATE OF BIRTH:  1940/11/25  DATE OF PROCEDURE:  05/21/2016 DATE OF DISCHARGE:  05/21/2016                              OPERATIVE REPORT   He is a 76 year old male in Orthopedic Surgery.  PREOPERATIVE DIAGNOSIS:  End-stage degenerative joint disease, right hip.  POSTOPERATIVE DIAGNOSIS:  End-stage degenerative joint disease, right hip.  PROCEDURES: 1. Right total hip replacement with a Corail stem size 13, high     offset, a 56-mm Pinnacle cup with a +4 neutral liner and a -2     stainless steel hip ball. 2. Interpretation of multiple intraoperative fluoroscopic images.  SURGEON:  Alta Corning, M.D.  ASSISTANT:  Gary Fleet, P.A.  ANESTHESIA:  General.  BRIEF HISTORY:  Mr. Colato is a 76 year old male with long history of complaints of right hip pain.  He had been treated conservatively for prolonged period of time.  After failure of all conservative care, he was taken to the operating room for right total hip replacement. Preoperative x-ray showed that he had a severe degenerative change, he was having night pain and light activity pain.  He was brought to the operating room for right total hip replacement.  DESCRIPTION OF PROCEDURE:  The patient was taken to the operating room. After adequate anesthesia was obtained with general anesthetic, the patient was placed supine on the operating table.  He then moved onto the Hana bed after a boots had been placed and all bony prominences were well padded.  Preoperative fluoro images were taken.  Attention then turned to the right hip where after routine prep and drape, an incision was made for an anterior approach to the hip.  Subcutaneous tissue down to the level of the tensor fascia, which was identified.  A rent was made in the tensor  fascia and it was held out of place, and the muscle was finger fractured, retractors were put in place above and below the neck and a provisional capsular opening was made with stitches placed. Once this was done, attention was turned towards the provisional neck cut and once that was completed, the femoral head was removed. Retractors were put in place above and below the acetabulum and the acetabulum was sequentially reamed to a level of 55 mm and 56-mm Pinnacle cup was hammered into place, 45 degrees of lateral opening, 30 degrees of anteversion.  Once this was done, attention turned to the stem side, which was opened with a Cookie cutter followed by a chili pepper, followed by sequential rasping up to a level of 13 and tested it with a +0 ball standard offset was slightly long, we went to the high offset with a +0 ball, still slightly long, although was probably fine, but I felt that we could go to a minus ball given his age and activity level, and I thought that was appropriate.  At this point, we put the final stem in, it is a little proud, and at that point, I was really convinced that the minus ball would be  appropriate, so we went with a 36 -1 hip ball and then hip was reduced and final images were taken.  At this point, the topical tranexamic acid was used for 5 minutes in the hip wound.  Following this, the capsule was closed and then the tensor fascia was closed with 0 Vicryl running, skin with 0 and 2-0 Vicryl, and subcuticular Monocryl.  Benzoin and Steri-Strips were applied.  Sterile compressive dressing was applied, and the patient was taken to the recovery room, was noted to be in satisfactory condition.  Estimated blood loss for procedure was minimal.     Alta Corning, M.D.   ______________________________ Alta Corning, M.D.    Corliss Skains  D:  05/21/2016  T:  05/22/2016  Job:  [id number]  cc:   Alta Corning, M.D.

## 2016-05-24 ENCOUNTER — Ambulatory Visit (INDEPENDENT_AMBULATORY_CARE_PROVIDER_SITE_OTHER): Payer: Medicare Other | Admitting: Pharmacist Clinician (PhC)/ Clinical Pharmacy Specialist

## 2016-05-24 ENCOUNTER — Encounter (HOSPITAL_COMMUNITY): Payer: Self-pay | Admitting: Orthopedic Surgery

## 2016-05-24 DIAGNOSIS — Z471 Aftercare following joint replacement surgery: Secondary | ICD-10-CM | POA: Diagnosis not present

## 2016-05-24 DIAGNOSIS — I10 Essential (primary) hypertension: Secondary | ICD-10-CM | POA: Diagnosis not present

## 2016-05-24 DIAGNOSIS — I82709 Chronic embolism and thrombosis of unspecified veins of unspecified upper extremity: Secondary | ICD-10-CM | POA: Diagnosis not present

## 2016-05-24 DIAGNOSIS — I480 Paroxysmal atrial fibrillation: Secondary | ICD-10-CM | POA: Diagnosis not present

## 2016-05-24 DIAGNOSIS — I495 Sick sinus syndrome: Secondary | ICD-10-CM | POA: Diagnosis not present

## 2016-05-24 DIAGNOSIS — I82722 Chronic embolism and thrombosis of deep veins of left upper extremity: Secondary | ICD-10-CM

## 2016-05-24 DIAGNOSIS — D689 Coagulation defect, unspecified: Secondary | ICD-10-CM | POA: Diagnosis not present

## 2016-05-24 LAB — POCT INR: INR: 2.3

## 2016-05-24 NOTE — Discharge Summary (Signed)
Patient ID: Richard [name] MRN: [id number] DOB/AGE: June 25, 1940 76 y.o.  Admit date: 05/21/2016 Discharge date: 05/22/2016 Admission Diagnoses:  Principal Problem:   Primary osteoarthritis of right hip   Discharge Diagnoses:  Same  Past Medical History:  Diagnosis Date  . 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 Brady-tachy syndrome ([place])   . Chronic anticoagulation, on coumadin for PAF 06/16/2012  . Clotting disorder (H[facility])    hx of DVT  . Dysrhythmia   . H/O cardiovascular stress test    normal, done as a baseline , ? where it was done, 4-5 yrs. ago  . Hyperlipidemia 06/16/2012  . Hypertension   . Pacemaker    Medtronic; implanted 06/15/12  . PAF (paroxysmal atrial fibrillation) ([place])     Surgeries: Procedure(s):Right TOTAL HIP ARTHROPLASTY ANTERIOR APPROACH on 05/21/2016   Consultants:   Discharged Condition: Improved  Hospital Course: Richard [name] is an 76 y.o. male who was admitted 05/21/2016 for operative treatment ofPrimary osteoarthritis of right hip. Patient has severe unremitting pain that affects sleep, daily activities, and work/hobbies. After pre-op clearance the patient was taken to the operating room on 05/21/2016 and underwent  Procedure(s): Right TOTAL HIP ARTHROPLASTY ANTERIOR APPROACH.    Patient was given perioperative antibiotics: Anti-infectives    Start     Dose/Rate Route Frequency Ordered Stop   05/21/16 1800  ceFAZolin (ANCEF) IVPB 2g/100 mL premix     2 g 200 mL/hr over 30 Minutes Intravenous Every 6 hours 05/21/16 1431 05/22/16 0111   05/21/16 0925  ceFAZolin (ANCEF) 2-4 GM/100ML-% IVPB    Comments:  Debbe Bales, Meredit: cabinet override      05/21/16 0925 05/21/16 2129       Patient was given sequential compression devices, early ambulation, and chemoprophylaxis to prevent DVT.The patient did very well and on postoperative day #1 was ambulating with physical therapy, his vital signs were stable, and he was  taking oral pain medication. Due to this he was discharged home with home health physical therapy.  Patient benefited maximally from hospital stay and there were no complications.    Recent vital signs: See chart for details   Recent laboratory studies:  Recent Labs  05/22/16 0752 05/24/16  WBC 11.3*  --   HGB 14.3  --   HCT 40.4  --   PLT 157  --   NA 137  --   K 5.1  --   CL 105  --   CO2 25  --   BUN 13  --   CREATININE 1.00  --   GLUCOSE 152*  --   INR 1.47 2.3  CALCIUM 8.8*  --      Discharge Medications:   Allergies as of 05/22/2016   No Known Allergies     Medication List    TAKE these medications   atenolol 25 MG tablet Commonly known as:  TENORMIN Take 1 tablet (25 mg total) by mouth daily.   CALCIUM CITRATE + PO Take 1 tablet by mouth daily.   docusate sodium 100 MG capsule Commonly known as:  COLACE Take 1 capsule (100 mg total) by mouth 2 (two) times daily.   FISH OIL 300 MG Caps Take 1 capsule by mouth daily.   multivitamin with minerals Tabs tablet Take 1 tablet by mouth daily.   oxyCODONE-acetaminophen 5-325 MG tablet Commonly known as:  PERCOCET/ROXICET Take 1-2 tablets by mouth every 6 (six) hours as needed for severe pain.  simvastatin 40 MG tablet Commonly known as:  ZOCOR Take 1 tablet (40 mg total) by mouth daily.   tiZANidine 2 MG tablet Commonly known as:  ZANAFLEX Take 1 tablet (2 mg total) by mouth every 8 (eight) hours as needed for muscle spasms.   warfarin 2.5 MG tablet Commonly known as:  COUMADIN Take 1 tablet by mouth daily or as directed What changed:  how much to take  how to take this  when to take this  additional instructions       Diagnostic Studies: Dg Chest 2 View  Result Date: 05/13/2016 CLINICAL DATA:  Preop for hip replacement. EXAM: CHEST  2 VIEW COMPARISON:  Radiographs of June 31, 2014. FINDINGS: The heart size and mediastinal contours are within normal limits. Both lungs are clear. No  pneumothorax or pleural effusion is noted. Left-sided pacemaker is unchanged in position. The visualized skeletal structures are unremarkable. IMPRESSION: No active cardiopulmonary disease. Electronically Signed   By: Marijo Conception, M.D.   On: 05/13/2016 10:09   Dg C-arm 61-120 Min  Result Date: 05/21/2016 CLINICAL DATA:  Osteoarthritis right hip, postop EXAM: OPERATIVE RIGHT HIP (WITH PELVIS IF PERFORMED) 2 VIEWS TECHNIQUE: Fluoroscopic spot image(s) were submitted for interpretation post-operatively. COMPARISON:  None. FINDINGS: Changes of right hip replacement. Normal AP alignment. No hardware bony complicating feature. IMPRESSION: Right hip replacement without visible complicating feature. Electronically Signed   By: Rolm Baptise M.D.   On: 05/21/2016 11:50   Dg Hip Operative Unilat With Pelvis Right  Result Date: 05/21/2016 CLINICAL DATA:  Osteoarthritis right hip, postop EXAM: OPERATIVE RIGHT HIP (WITH PELVIS IF PERFORMED) 2 VIEWS TECHNIQUE: Fluoroscopic spot image(s) were submitted for interpretation post-operatively. COMPARISON:  None. FINDINGS: Changes of right hip replacement. Normal AP alignment. No hardware bony complicating feature. IMPRESSION: Right hip replacement without visible complicating feature. Electronically Signed   By: Rolm Baptise M.D.   On: 05/21/2016 11:50    Disposition: 01-Home or Self Care  Discharge Instructions    Call MD / Call 911    Complete by:  As directed    If you experience chest pain or shortness of breath, CALL 911 and be transported to the hospital emergency room.  If you develope a fever above 101 F, pus (white drainage) or increased drainage or redness at the wound, or calf pain, call your surgeon's office.   Constipation Prevention    Complete by:  As directed    Drink plenty of fluids.  Prune juice may be helpful.  You may use a stool softener, such as Colace (over the counter) 100 mg twice a day.  Use MiraLax (over the counter) for constipation as  needed.   Diet - low sodium heart healthy    Complete by:  As directed    Increase activity slowly as tolerated    Complete by:  As directed       Follow-up Information    GRAVES,JOHN L, MD. Schedule an appointment as soon as possible for a visit in 2 week(s).   Specialty:  Orthopedic Surgery Contact information: [place] [address] [address] Follow up.   Specialty:  [place] Why:  Someone from [facility] will contact you concerning start date and time for Home Health Nurse and Phycial Therapist. Contact information: [place] [address] [phone]            Signed: Erlene Senters  05/24/2016, 6:21 PM

## 2016-05-25 NOTE — Anesthesia Postprocedure Evaluation (Addendum)
Anesthesia Post Note  Patient: Richard [name]  Procedure(s) Performed: Procedure(s) (LRB): TOTAL HIP ARTHROPLASTY ANTERIOR APPROACH (Right)  Patient location during evaluation: PACU Anesthesia Type: MAC Level of consciousness: awake and alert Pain management: pain level controlled Vital Signs Assessment: post-procedure vital signs reviewed and stable Respiratory status: spontaneous breathing, nonlabored ventilation, respiratory function stable and patient connected to nasal cannula oxygen Cardiovascular status: blood pressure returned to baseline and stable Postop Assessment: no signs of nausea or vomiting Anesthetic complications: no        Last Vitals:  Vitals:   05/22/16 0045 05/22/16 0632  BP: (!) 95/41 (!) 104/55  Pulse: 61 61  Resp: 15 16  Temp: 36.8 C 36.6 C    Last Pain:  Vitals:   05/22/16 1146  TempSrc:   PainSc: 4    Pain Goal: Patients Stated Pain Goal: 2 (05/22/16 0657)               Effie Berkshire

## 2016-05-26 DIAGNOSIS — Z471 Aftercare following joint replacement surgery: Secondary | ICD-10-CM | POA: Diagnosis not present

## 2016-05-26 DIAGNOSIS — I82709 Chronic embolism and thrombosis of unspecified veins of unspecified upper extremity: Secondary | ICD-10-CM | POA: Diagnosis not present

## 2016-05-26 DIAGNOSIS — D689 Coagulation defect, unspecified: Secondary | ICD-10-CM | POA: Diagnosis not present

## 2016-05-26 DIAGNOSIS — I10 Essential (primary) hypertension: Secondary | ICD-10-CM | POA: Diagnosis not present

## 2016-05-26 DIAGNOSIS — I495 Sick sinus syndrome: Secondary | ICD-10-CM | POA: Diagnosis not present

## 2016-05-26 DIAGNOSIS — I480 Paroxysmal atrial fibrillation: Secondary | ICD-10-CM | POA: Diagnosis not present

## 2016-05-28 ENCOUNTER — Encounter: Payer: Medicare Other | Admitting: Internal Medicine

## 2016-05-28 DIAGNOSIS — Z471 Aftercare following joint replacement surgery: Secondary | ICD-10-CM | POA: Diagnosis not present

## 2016-05-28 DIAGNOSIS — I480 Paroxysmal atrial fibrillation: Secondary | ICD-10-CM | POA: Diagnosis not present

## 2016-05-28 DIAGNOSIS — I10 Essential (primary) hypertension: Secondary | ICD-10-CM | POA: Diagnosis not present

## 2016-05-28 DIAGNOSIS — I495 Sick sinus syndrome: Secondary | ICD-10-CM | POA: Diagnosis not present

## 2016-05-28 DIAGNOSIS — D689 Coagulation defect, unspecified: Secondary | ICD-10-CM | POA: Diagnosis not present

## 2016-05-28 DIAGNOSIS — I82709 Chronic embolism and thrombosis of unspecified veins of unspecified upper extremity: Secondary | ICD-10-CM | POA: Diagnosis not present

## 2016-06-01 ENCOUNTER — Other Ambulatory Visit: Payer: Self-pay | Admitting: Internal Medicine

## 2016-06-01 ENCOUNTER — Telehealth: Payer: Self-pay | Admitting: Nurse Practitioner

## 2016-06-01 MED ORDER — WARFARIN SODIUM 2.5 MG PO TABS: ORAL_TABLET | ORAL | 1 refills | Status: DC

## 2016-06-01 MED ORDER — ATENOLOL 25 MG PO TABS: 25.0000 mg | ORAL_TABLET | Freq: Every day | ORAL | 3 refills | Status: DC

## 2016-06-01 MED ORDER — ATENOLOL 25 MG PO TABS: 25.0000 mg | ORAL_TABLET | Freq: Every day | ORAL | 11 refills | Status: DC

## 2016-06-01 NOTE — Telephone Encounter (Signed)
Follow Up:    Returing your call.

## 2016-06-01 NOTE — Telephone Encounter (Signed)
New Message    *STAT* If patient is at the pharmacy, call can be transferred to refill team.   1. Which medications need to be refilled? (please list name of each medication and dose if known)   atenolol (TENORMIN) 25 MG tablet    warfarin (COUMADIN) 2.5 MG tablet   2. Which pharmacy/location (including street and city if local pharmacy) is medication to be sent to? Both prescriptions go to [facility] on [address]  3. Do they need a 30 day or 90 day supply? 90 day

## 2016-06-01 NOTE — Telephone Encounter (Signed)
Spoke with pt and asked if there was a [facility] nearby as they have been keeping Atenolol in stock.  [facility] [address] would be ok to check with.  Spoke with pharmacy employee and they said they did have this in stock.  Spoke with pt and he said ok to send in and requested 90 day prescription.  Pt very appreciative for assistance.

## 2016-06-01 NOTE — Telephone Encounter (Signed)
Left message to call back  

## 2016-06-01 NOTE — Telephone Encounter (Signed)
Richard [name] is calling because he states that he could not get his 90 day  atenolol prescription refilled due to a "national shortage." He is requesting that we provide a substitute and send information to [place], [place], [place] [address] [phone]. Patient would like a call back with suggestion for alternative as well.  Thanks.

## 2016-06-07 DIAGNOSIS — M1611 Unilateral primary osteoarthritis, right hip: Secondary | ICD-10-CM | POA: Diagnosis not present

## 2016-06-09 DIAGNOSIS — H43813 Vitreous degeneration, bilateral: Secondary | ICD-10-CM | POA: Diagnosis not present

## 2016-06-09 DIAGNOSIS — H524 Presbyopia: Secondary | ICD-10-CM | POA: Diagnosis not present

## 2016-06-09 DIAGNOSIS — H2513 Age-related nuclear cataract, bilateral: Secondary | ICD-10-CM | POA: Diagnosis not present

## 2016-06-09 DIAGNOSIS — H25013 Cortical age-related cataract, bilateral: Secondary | ICD-10-CM | POA: Diagnosis not present

## 2016-06-14 ENCOUNTER — Ambulatory Visit (INDEPENDENT_AMBULATORY_CARE_PROVIDER_SITE_OTHER): Payer: Medicare Other | Admitting: Pharmacist Clinician (PhC)/ Clinical Pharmacy Specialist

## 2016-06-14 DIAGNOSIS — I4891 Unspecified atrial fibrillation: Secondary | ICD-10-CM

## 2016-06-14 DIAGNOSIS — I82722 Chronic embolism and thrombosis of deep veins of left upper extremity: Secondary | ICD-10-CM

## 2016-06-14 DIAGNOSIS — Z7901 Long term (current) use of anticoagulants: Secondary | ICD-10-CM | POA: Diagnosis not present

## 2016-06-14 LAB — POCT INR: INR: 3.3

## 2016-06-28 DIAGNOSIS — Z9889 Other specified postprocedural states: Secondary | ICD-10-CM | POA: Diagnosis not present

## 2016-07-05 ENCOUNTER — Ambulatory Visit (INDEPENDENT_AMBULATORY_CARE_PROVIDER_SITE_OTHER): Payer: Medicare Other | Admitting: Pharmacist Clinician (PhC)/ Clinical Pharmacy Specialist

## 2016-07-05 DIAGNOSIS — Z7901 Long term (current) use of anticoagulants: Secondary | ICD-10-CM

## 2016-07-05 DIAGNOSIS — I4891 Unspecified atrial fibrillation: Secondary | ICD-10-CM | POA: Diagnosis not present

## 2016-07-05 DIAGNOSIS — I82722 Chronic embolism and thrombosis of deep veins of left upper extremity: Secondary | ICD-10-CM | POA: Diagnosis not present

## 2016-07-05 LAB — POCT INR: INR: 4.4

## 2016-07-19 ENCOUNTER — Ambulatory Visit (INDEPENDENT_AMBULATORY_CARE_PROVIDER_SITE_OTHER): Payer: Medicare Other | Admitting: Pharmacist

## 2016-07-19 DIAGNOSIS — I4891 Unspecified atrial fibrillation: Secondary | ICD-10-CM | POA: Diagnosis not present

## 2016-07-19 DIAGNOSIS — Z7901 Long term (current) use of anticoagulants: Secondary | ICD-10-CM | POA: Diagnosis not present

## 2016-07-19 DIAGNOSIS — I82722 Chronic embolism and thrombosis of deep veins of left upper extremity: Secondary | ICD-10-CM

## 2016-07-19 LAB — POCT INR: INR: 2.2

## 2016-08-04 ENCOUNTER — Ambulatory Visit (INDEPENDENT_AMBULATORY_CARE_PROVIDER_SITE_OTHER): Payer: Medicare Other | Admitting: *Deleted

## 2016-08-04 ENCOUNTER — Telehealth: Payer: Self-pay | Admitting: Cardiology

## 2016-08-04 DIAGNOSIS — I495 Sick sinus syndrome: Secondary | ICD-10-CM | POA: Diagnosis not present

## 2016-08-04 NOTE — Telephone Encounter (Signed)
Spoke with pt and reminded pt of remote transmission that is due today. Pt verbalized understanding.   

## 2016-08-04 NOTE — Progress Notes (Signed)
Remote pacemaker transmission.   

## 2016-08-05 ENCOUNTER — Encounter: Payer: Self-pay | Admitting: Cardiology

## 2016-08-06 LAB — CUP PACEART REMOTE DEVICE CHECK
Battery Remaining Longevity: 49 mo
Brady Statistic AP VP Percent: 0.25 %
Brady Statistic AP VS Percent: 98.59 %
Brady Statistic AS VS Percent: 1.15 %
Implantable Lead Implant Date: 20140130
Implantable Lead Location: 753860
Lead Channel Impedance Value: 456 Ohm
Lead Channel Impedance Value: 494 Ohm
Lead Channel Pacing Threshold Amplitude: 0.5 V
Lead Channel Pacing Threshold Pulse Width: 0.4 ms
Lead Channel Sensing Intrinsic Amplitude: 1.25 mV
Lead Channel Sensing Intrinsic Amplitude: 1.25 mV
Lead Channel Sensing Intrinsic Amplitude: 7.125 mV
Lead Channel Setting Pacing Amplitude: 2.5 V
MDC IDC LEAD IMPLANT DT: 20140130
MDC IDC LEAD LOCATION: 753859
MDC IDC MSMT BATTERY VOLTAGE: 2.99 V
MDC IDC MSMT LEADCHNL RA PACING THRESHOLD AMPLITUDE: 1.375 V
MDC IDC MSMT LEADCHNL RA PACING THRESHOLD PULSEWIDTH: 0.4 ms
MDC IDC MSMT LEADCHNL RV IMPEDANCE VALUE: 494 Ohm
MDC IDC MSMT LEADCHNL RV IMPEDANCE VALUE: 608 Ohm
MDC IDC MSMT LEADCHNL RV SENSING INTR AMPL: 7.125 mV
MDC IDC PG IMPLANT DT: 20140130
MDC IDC SESS DTM: 20180321154442
MDC IDC SET LEADCHNL RA PACING AMPLITUDE: 3 V
MDC IDC SET LEADCHNL RV PACING PULSEWIDTH: 0.4 ms
MDC IDC SET LEADCHNL RV SENSING SENSITIVITY: 0.9 mV
MDC IDC STAT BRADY AS VP PERCENT: 0.01 %
MDC IDC STAT BRADY RA PERCENT PACED: 98.18 %
MDC IDC STAT BRADY RV PERCENT PACED: 0.26 %

## 2016-08-09 ENCOUNTER — Ambulatory Visit (INDEPENDENT_AMBULATORY_CARE_PROVIDER_SITE_OTHER): Payer: Medicare Other | Admitting: Pharmacist

## 2016-08-09 DIAGNOSIS — Z7901 Long term (current) use of anticoagulants: Secondary | ICD-10-CM

## 2016-08-09 DIAGNOSIS — I4891 Unspecified atrial fibrillation: Secondary | ICD-10-CM | POA: Diagnosis not present

## 2016-08-09 DIAGNOSIS — I82722 Chronic embolism and thrombosis of deep veins of left upper extremity: Secondary | ICD-10-CM

## 2016-08-09 LAB — POCT INR: INR: 2

## 2016-08-23 DIAGNOSIS — Z96641 Presence of right artificial hip joint: Secondary | ICD-10-CM | POA: Diagnosis not present

## 2016-08-23 DIAGNOSIS — M25551 Pain in right hip: Secondary | ICD-10-CM | POA: Diagnosis not present

## 2016-09-20 ENCOUNTER — Ambulatory Visit (INDEPENDENT_AMBULATORY_CARE_PROVIDER_SITE_OTHER): Payer: Medicare Other | Admitting: Pharmacist Clinician (PhC)/ Clinical Pharmacy Specialist

## 2016-09-20 DIAGNOSIS — I82722 Chronic embolism and thrombosis of deep veins of left upper extremity: Secondary | ICD-10-CM | POA: Diagnosis not present

## 2016-09-20 DIAGNOSIS — Z7901 Long term (current) use of anticoagulants: Secondary | ICD-10-CM | POA: Diagnosis not present

## 2016-09-20 DIAGNOSIS — I4891 Unspecified atrial fibrillation: Secondary | ICD-10-CM | POA: Diagnosis not present

## 2016-09-20 LAB — POCT INR: INR: 2.8

## 2016-10-15 ENCOUNTER — Encounter (HOSPITAL_COMMUNITY): Payer: Self-pay | Admitting: Orthopedic Surgery

## 2016-10-15 NOTE — Addendum Note (Signed)
Addendum  created 10/15/16 0836 by Ermias Tomeo D, MD   Sign clinical note    

## 2016-10-20 DIAGNOSIS — Z7901 Long term (current) use of anticoagulants: Secondary | ICD-10-CM | POA: Diagnosis not present

## 2016-10-20 DIAGNOSIS — E784 Other hyperlipidemia: Secondary | ICD-10-CM | POA: Diagnosis not present

## 2016-10-20 DIAGNOSIS — R030 Elevated blood-pressure reading, without diagnosis of hypertension: Secondary | ICD-10-CM | POA: Diagnosis not present

## 2016-10-20 DIAGNOSIS — Z1389 Encounter for screening for other disorder: Secondary | ICD-10-CM | POA: Diagnosis not present

## 2016-10-20 DIAGNOSIS — M5380 Other specified dorsopathies, site unspecified: Secondary | ICD-10-CM | POA: Diagnosis not present

## 2016-10-20 DIAGNOSIS — Z950 Presence of cardiac pacemaker: Secondary | ICD-10-CM | POA: Diagnosis not present

## 2016-10-20 DIAGNOSIS — Z6826 Body mass index (BMI) 26.0-26.9, adult: Secondary | ICD-10-CM | POA: Diagnosis not present

## 2016-10-20 DIAGNOSIS — I4891 Unspecified atrial fibrillation: Secondary | ICD-10-CM | POA: Diagnosis not present

## 2016-10-20 DIAGNOSIS — M1990 Unspecified osteoarthritis, unspecified site: Secondary | ICD-10-CM | POA: Diagnosis not present

## 2016-11-02 ENCOUNTER — Ambulatory Visit (INDEPENDENT_AMBULATORY_CARE_PROVIDER_SITE_OTHER): Payer: Medicare Other | Admitting: Pharmacist

## 2016-11-02 DIAGNOSIS — Z7901 Long term (current) use of anticoagulants: Secondary | ICD-10-CM | POA: Diagnosis not present

## 2016-11-02 DIAGNOSIS — I4891 Unspecified atrial fibrillation: Secondary | ICD-10-CM | POA: Diagnosis not present

## 2016-11-02 DIAGNOSIS — I82722 Chronic embolism and thrombosis of deep veins of left upper extremity: Secondary | ICD-10-CM

## 2016-11-02 LAB — POCT INR: INR: 1.6

## 2016-11-03 ENCOUNTER — Encounter: Payer: Medicare Other | Admitting: *Deleted

## 2016-11-05 ENCOUNTER — Encounter: Payer: Self-pay | Admitting: Cardiology

## 2016-11-08 ENCOUNTER — Ambulatory Visit (INDEPENDENT_AMBULATORY_CARE_PROVIDER_SITE_OTHER): Payer: Medicare Other | Admitting: *Deleted

## 2016-11-08 DIAGNOSIS — I495 Sick sinus syndrome: Secondary | ICD-10-CM

## 2016-11-09 NOTE — Progress Notes (Signed)
Remote pacemaker transmission.   

## 2016-11-10 ENCOUNTER — Encounter: Payer: Self-pay | Admitting: Cardiology

## 2016-11-10 LAB — CUP PACEART REMOTE DEVICE CHECK
Battery Voltage: 2.98 V
Brady Statistic AP VP Percent: 0.27 %
Brady Statistic AS VP Percent: 0.03 %
Brady Statistic AS VS Percent: 1.35 %
Brady Statistic RA Percent Paced: 98.05 %
Date Time Interrogation Session: 20180625181151
Implantable Lead Implant Date: 20140130
Implantable Lead Location: 753859
Implantable Pulse Generator Implant Date: 20140130
Lead Channel Impedance Value: 456 Ohm
Lead Channel Impedance Value: 570 Ohm
Lead Channel Pacing Threshold Amplitude: 1.75 V
Lead Channel Pacing Threshold Pulse Width: 0.4 ms
Lead Channel Pacing Threshold Pulse Width: 0.4 ms
Lead Channel Sensing Intrinsic Amplitude: 6.125 mV
Lead Channel Setting Pacing Amplitude: 3.5 V
Lead Channel Setting Sensing Sensitivity: 0.9 mV
MDC IDC LEAD IMPLANT DT: 20140130
MDC IDC LEAD LOCATION: 753860
MDC IDC MSMT BATTERY REMAINING LONGEVITY: 40 mo
MDC IDC MSMT LEADCHNL RA IMPEDANCE VALUE: 456 Ohm
MDC IDC MSMT LEADCHNL RA IMPEDANCE VALUE: 494 Ohm
MDC IDC MSMT LEADCHNL RA SENSING INTR AMPL: 3.25 mV
MDC IDC MSMT LEADCHNL RA SENSING INTR AMPL: 3.25 mV
MDC IDC MSMT LEADCHNL RV PACING THRESHOLD AMPLITUDE: 0.75 V
MDC IDC MSMT LEADCHNL RV SENSING INTR AMPL: 6.125 mV
MDC IDC SET LEADCHNL RV PACING AMPLITUDE: 2.5 V
MDC IDC SET LEADCHNL RV PACING PULSEWIDTH: 0.4 ms
MDC IDC STAT BRADY AP VS PERCENT: 98.35 %
MDC IDC STAT BRADY RV PERCENT PACED: 0.3 %

## 2016-12-08 ENCOUNTER — Ambulatory Visit (INDEPENDENT_AMBULATORY_CARE_PROVIDER_SITE_OTHER): Payer: Medicare Other | Admitting: Pharmacist Clinician (PhC)/ Clinical Pharmacy Specialist

## 2016-12-08 DIAGNOSIS — I4891 Unspecified atrial fibrillation: Secondary | ICD-10-CM | POA: Diagnosis not present

## 2016-12-08 DIAGNOSIS — Z7901 Long term (current) use of anticoagulants: Secondary | ICD-10-CM

## 2016-12-08 DIAGNOSIS — I82722 Chronic embolism and thrombosis of deep veins of left upper extremity: Secondary | ICD-10-CM | POA: Diagnosis not present

## 2016-12-08 LAB — POCT INR: INR: 2.4

## 2017-01-18 ENCOUNTER — Ambulatory Visit (INDEPENDENT_AMBULATORY_CARE_PROVIDER_SITE_OTHER): Payer: Medicare Other | Admitting: Pharmacist Clinician (PhC)/ Clinical Pharmacy Specialist

## 2017-01-18 DIAGNOSIS — Z7901 Long term (current) use of anticoagulants: Secondary | ICD-10-CM | POA: Diagnosis not present

## 2017-01-18 DIAGNOSIS — I4891 Unspecified atrial fibrillation: Secondary | ICD-10-CM

## 2017-01-18 DIAGNOSIS — I82722 Chronic embolism and thrombosis of deep veins of left upper extremity: Secondary | ICD-10-CM | POA: Diagnosis not present

## 2017-01-18 LAB — POCT INR: INR: 2.6

## 2017-02-07 ENCOUNTER — Ambulatory Visit (INDEPENDENT_AMBULATORY_CARE_PROVIDER_SITE_OTHER): Payer: Medicare Other | Admitting: *Deleted

## 2017-02-07 DIAGNOSIS — I495 Sick sinus syndrome: Secondary | ICD-10-CM

## 2017-02-07 NOTE — Progress Notes (Signed)
Remote pacemaker transmission.   

## 2017-02-08 LAB — CUP PACEART REMOTE DEVICE CHECK
Battery Remaining Longevity: 40 mo
Battery Voltage: 2.97 V
Brady Statistic AP VP Percent: 0.41 %
Brady Statistic AS VS Percent: 2.16 %
Brady Statistic RA Percent Paced: 96.8 %
Date Time Interrogation Session: 20180924112519
Implantable Lead Implant Date: 20140130
Implantable Lead Location: 753859
Implantable Lead Location: 753860
Implantable Pulse Generator Implant Date: 20140130
Lead Channel Impedance Value: 456 Ohm
Lead Channel Impedance Value: 494 Ohm
Lead Channel Pacing Threshold Amplitude: 0.5 V
Lead Channel Pacing Threshold Amplitude: 1.625 V
Lead Channel Pacing Threshold Pulse Width: 0.4 ms
Lead Channel Sensing Intrinsic Amplitude: 3.875 mV
Lead Channel Sensing Intrinsic Amplitude: 3.875 mV
Lead Channel Setting Pacing Amplitude: 3.5 V
Lead Channel Setting Sensing Sensitivity: 0.9 mV
MDC IDC LEAD IMPLANT DT: 20140130
MDC IDC MSMT LEADCHNL RA IMPEDANCE VALUE: 456 Ohm
MDC IDC MSMT LEADCHNL RV IMPEDANCE VALUE: 570 Ohm
MDC IDC MSMT LEADCHNL RV PACING THRESHOLD PULSEWIDTH: 0.4 ms
MDC IDC MSMT LEADCHNL RV SENSING INTR AMPL: 5.75 mV
MDC IDC MSMT LEADCHNL RV SENSING INTR AMPL: 5.75 mV
MDC IDC SET LEADCHNL RV PACING AMPLITUDE: 2.5 V
MDC IDC SET LEADCHNL RV PACING PULSEWIDTH: 0.4 ms
MDC IDC STAT BRADY AP VS PERCENT: 97.25 %
MDC IDC STAT BRADY AS VP PERCENT: 0.18 %
MDC IDC STAT BRADY RV PERCENT PACED: 0.65 %

## 2017-02-10 ENCOUNTER — Encounter: Payer: Self-pay | Admitting: Cardiology

## 2017-02-16 DIAGNOSIS — Z6826 Body mass index (BMI) 26.0-26.9, adult: Secondary | ICD-10-CM | POA: Diagnosis not present

## 2017-02-16 DIAGNOSIS — J209 Acute bronchitis, unspecified: Secondary | ICD-10-CM | POA: Diagnosis not present

## 2017-02-16 DIAGNOSIS — R05 Cough: Secondary | ICD-10-CM | POA: Diagnosis not present

## 2017-02-28 ENCOUNTER — Ambulatory Visit (INDEPENDENT_AMBULATORY_CARE_PROVIDER_SITE_OTHER): Payer: Medicare Other | Admitting: Pharmacist

## 2017-02-28 DIAGNOSIS — Z7901 Long term (current) use of anticoagulants: Secondary | ICD-10-CM | POA: Diagnosis not present

## 2017-02-28 DIAGNOSIS — I82722 Chronic embolism and thrombosis of deep veins of left upper extremity: Secondary | ICD-10-CM | POA: Diagnosis not present

## 2017-02-28 DIAGNOSIS — I4891 Unspecified atrial fibrillation: Secondary | ICD-10-CM

## 2017-02-28 LAB — POCT INR: INR: 1.6

## 2017-03-01 DIAGNOSIS — L570 Actinic keratosis: Secondary | ICD-10-CM | POA: Diagnosis not present

## 2017-03-01 DIAGNOSIS — D225 Melanocytic nevi of trunk: Secondary | ICD-10-CM | POA: Diagnosis not present

## 2017-03-01 DIAGNOSIS — X32XXXA Exposure to sunlight, initial encounter: Secondary | ICD-10-CM | POA: Diagnosis not present

## 2017-03-01 DIAGNOSIS — Z1283 Encounter for screening for malignant neoplasm of skin: Secondary | ICD-10-CM | POA: Diagnosis not present

## 2017-03-01 DIAGNOSIS — C44319 Basal cell carcinoma of skin of other parts of face: Secondary | ICD-10-CM | POA: Diagnosis not present

## 2017-03-17 ENCOUNTER — Other Ambulatory Visit: Payer: Self-pay | Admitting: *Deleted

## 2017-03-17 MED ORDER — WARFARIN SODIUM 2.5 MG PO TABS: ORAL_TABLET | ORAL | 1 refills | Status: DC

## 2017-03-28 ENCOUNTER — Ambulatory Visit (INDEPENDENT_AMBULATORY_CARE_PROVIDER_SITE_OTHER): Payer: Medicare Other | Admitting: Pharmacist Clinician (PhC)/ Clinical Pharmacy Specialist

## 2017-03-28 DIAGNOSIS — Z7901 Long term (current) use of anticoagulants: Secondary | ICD-10-CM

## 2017-03-28 DIAGNOSIS — I82722 Chronic embolism and thrombosis of deep veins of left upper extremity: Secondary | ICD-10-CM | POA: Diagnosis not present

## 2017-03-28 DIAGNOSIS — I4891 Unspecified atrial fibrillation: Secondary | ICD-10-CM | POA: Diagnosis not present

## 2017-03-28 LAB — POCT INR: INR: 1.8

## 2017-03-29 DIAGNOSIS — Z85828 Personal history of other malignant neoplasm of skin: Secondary | ICD-10-CM | POA: Diagnosis not present

## 2017-03-29 DIAGNOSIS — Z08 Encounter for follow-up examination after completed treatment for malignant neoplasm: Secondary | ICD-10-CM | POA: Diagnosis not present

## 2017-03-30 DIAGNOSIS — D23111 Other benign neoplasm of skin of right upper eyelid, including canthus: Secondary | ICD-10-CM | POA: Diagnosis not present

## 2017-03-30 DIAGNOSIS — H2513 Age-related nuclear cataract, bilateral: Secondary | ICD-10-CM | POA: Diagnosis not present

## 2017-03-30 DIAGNOSIS — H524 Presbyopia: Secondary | ICD-10-CM | POA: Diagnosis not present

## 2017-03-30 DIAGNOSIS — H43813 Vitreous degeneration, bilateral: Secondary | ICD-10-CM | POA: Diagnosis not present

## 2017-04-20 DIAGNOSIS — E7849 Other hyperlipidemia: Secondary | ICD-10-CM | POA: Diagnosis not present

## 2017-04-20 DIAGNOSIS — R82998 Other abnormal findings in urine: Secondary | ICD-10-CM | POA: Diagnosis not present

## 2017-04-20 DIAGNOSIS — Z79899 Other long term (current) drug therapy: Secondary | ICD-10-CM | POA: Diagnosis not present

## 2017-04-20 DIAGNOSIS — Z125 Encounter for screening for malignant neoplasm of prostate: Secondary | ICD-10-CM | POA: Diagnosis not present

## 2017-04-27 DIAGNOSIS — M5380 Other specified dorsopathies, site unspecified: Secondary | ICD-10-CM | POA: Diagnosis not present

## 2017-04-27 DIAGNOSIS — Z950 Presence of cardiac pacemaker: Secondary | ICD-10-CM | POA: Diagnosis not present

## 2017-04-27 DIAGNOSIS — I4891 Unspecified atrial fibrillation: Secondary | ICD-10-CM | POA: Diagnosis not present

## 2017-04-27 DIAGNOSIS — E7849 Other hyperlipidemia: Secondary | ICD-10-CM | POA: Diagnosis not present

## 2017-04-27 DIAGNOSIS — R030 Elevated blood-pressure reading, without diagnosis of hypertension: Secondary | ICD-10-CM | POA: Diagnosis not present

## 2017-04-27 DIAGNOSIS — Z0000 Encounter for general adult medical examination without abnormal findings: Secondary | ICD-10-CM | POA: Diagnosis not present

## 2017-04-27 DIAGNOSIS — Z1389 Encounter for screening for other disorder: Secondary | ICD-10-CM | POA: Diagnosis not present

## 2017-04-27 DIAGNOSIS — Z7901 Long term (current) use of anticoagulants: Secondary | ICD-10-CM | POA: Diagnosis not present

## 2017-04-27 DIAGNOSIS — M1990 Unspecified osteoarthritis, unspecified site: Secondary | ICD-10-CM | POA: Diagnosis not present

## 2017-04-27 DIAGNOSIS — Z6827 Body mass index (BMI) 27.0-27.9, adult: Secondary | ICD-10-CM | POA: Diagnosis not present

## 2017-04-29 DIAGNOSIS — Z1212 Encounter for screening for malignant neoplasm of rectum: Secondary | ICD-10-CM | POA: Diagnosis not present

## 2017-05-02 ENCOUNTER — Other Ambulatory Visit: Payer: Self-pay | Admitting: Internal Medicine

## 2017-05-02 ENCOUNTER — Ambulatory Visit (INDEPENDENT_AMBULATORY_CARE_PROVIDER_SITE_OTHER): Payer: Medicare Other | Admitting: Pharmacist Clinician (PhC)/ Clinical Pharmacy Specialist

## 2017-05-02 DIAGNOSIS — Z7901 Long term (current) use of anticoagulants: Secondary | ICD-10-CM | POA: Diagnosis not present

## 2017-05-02 DIAGNOSIS — I82722 Chronic embolism and thrombosis of deep veins of left upper extremity: Secondary | ICD-10-CM

## 2017-05-02 DIAGNOSIS — I4891 Unspecified atrial fibrillation: Secondary | ICD-10-CM | POA: Diagnosis not present

## 2017-05-02 LAB — POCT INR: INR: 1.6

## 2017-05-11 ENCOUNTER — Ambulatory Visit (INDEPENDENT_AMBULATORY_CARE_PROVIDER_SITE_OTHER): Payer: Medicare Other | Admitting: *Deleted

## 2017-05-11 DIAGNOSIS — D689 Coagulation defect, unspecified: Secondary | ICD-10-CM | POA: Insufficient documentation

## 2017-05-11 DIAGNOSIS — I10 Essential (primary) hypertension: Secondary | ICD-10-CM | POA: Insufficient documentation

## 2017-05-11 DIAGNOSIS — M1990 Unspecified osteoarthritis, unspecified site: Secondary | ICD-10-CM | POA: Insufficient documentation

## 2017-05-11 DIAGNOSIS — Z950 Presence of cardiac pacemaker: Secondary | ICD-10-CM | POA: Insufficient documentation

## 2017-05-11 DIAGNOSIS — I499 Cardiac arrhythmia, unspecified: Secondary | ICD-10-CM | POA: Insufficient documentation

## 2017-05-11 DIAGNOSIS — Z9289 Personal history of other medical treatment: Secondary | ICD-10-CM | POA: Insufficient documentation

## 2017-05-11 DIAGNOSIS — I495 Sick sinus syndrome: Secondary | ICD-10-CM | POA: Diagnosis not present

## 2017-05-11 NOTE — Progress Notes (Signed)
Remote pacemaker transmission.   

## 2017-05-12 ENCOUNTER — Encounter: Payer: Self-pay | Admitting: Cardiology

## 2017-05-12 LAB — CUP PACEART REMOTE DEVICE CHECK
Battery Remaining Longevity: 40 mo
Brady Statistic AP VP Percent: 1.96 %
Brady Statistic AP VS Percent: 94.66 %
Brady Statistic AS VS Percent: 3.29 %
Brady Statistic RA Percent Paced: 95.1 %
Implantable Lead Implant Date: 20140130
Lead Channel Impedance Value: 475 Ohm
Lead Channel Impedance Value: 513 Ohm
Lead Channel Impedance Value: 570 Ohm
Lead Channel Pacing Threshold Amplitude: 0.5 V
Lead Channel Pacing Threshold Pulse Width: 0.4 ms
Lead Channel Pacing Threshold Pulse Width: 0.4 ms
Lead Channel Sensing Intrinsic Amplitude: 4 mV
Lead Channel Sensing Intrinsic Amplitude: 4 mV
Lead Channel Sensing Intrinsic Amplitude: 5.875 mV
Lead Channel Setting Pacing Amplitude: 2.5 V
Lead Channel Setting Sensing Sensitivity: 0.9 mV
MDC IDC LEAD IMPLANT DT: 20140130
MDC IDC LEAD LOCATION: 753859
MDC IDC LEAD LOCATION: 753860
MDC IDC MSMT BATTERY VOLTAGE: 2.96 V
MDC IDC MSMT LEADCHNL RA PACING THRESHOLD AMPLITUDE: 1.375 V
MDC IDC MSMT LEADCHNL RV IMPEDANCE VALUE: 456 Ohm
MDC IDC MSMT LEADCHNL RV SENSING INTR AMPL: 5.875 mV
MDC IDC PG IMPLANT DT: 20140130
MDC IDC SESS DTM: 20181226143022
MDC IDC SET LEADCHNL RA PACING AMPLITUDE: 3.75 V
MDC IDC SET LEADCHNL RV PACING PULSEWIDTH: 0.4 ms
MDC IDC STAT BRADY AS VP PERCENT: 0.09 %
MDC IDC STAT BRADY RV PERCENT PACED: 2.18 %

## 2017-05-19 DIAGNOSIS — H2511 Age-related nuclear cataract, right eye: Secondary | ICD-10-CM | POA: Diagnosis not present

## 2017-05-19 DIAGNOSIS — H25041 Posterior subcapsular polar age-related cataract, right eye: Secondary | ICD-10-CM | POA: Diagnosis not present

## 2017-05-19 DIAGNOSIS — H52221 Regular astigmatism, right eye: Secondary | ICD-10-CM | POA: Diagnosis not present

## 2017-05-19 DIAGNOSIS — H52201 Unspecified astigmatism, right eye: Secondary | ICD-10-CM | POA: Diagnosis not present

## 2017-05-19 DIAGNOSIS — H25011 Cortical age-related cataract, right eye: Secondary | ICD-10-CM | POA: Diagnosis not present

## 2017-05-19 DIAGNOSIS — H25811 Combined forms of age-related cataract, right eye: Secondary | ICD-10-CM | POA: Diagnosis not present

## 2017-05-23 ENCOUNTER — Ambulatory Visit (INDEPENDENT_AMBULATORY_CARE_PROVIDER_SITE_OTHER): Payer: Medicare Other | Admitting: Pharmacist

## 2017-05-23 DIAGNOSIS — I82722 Chronic embolism and thrombosis of deep veins of left upper extremity: Secondary | ICD-10-CM

## 2017-05-23 DIAGNOSIS — I4891 Unspecified atrial fibrillation: Secondary | ICD-10-CM

## 2017-05-23 DIAGNOSIS — Z7901 Long term (current) use of anticoagulants: Secondary | ICD-10-CM | POA: Diagnosis not present

## 2017-05-23 LAB — POCT INR: INR: 2.2

## 2017-05-27 ENCOUNTER — Encounter: Payer: Self-pay | Admitting: Internal Medicine

## 2017-05-27 ENCOUNTER — Ambulatory Visit (INDEPENDENT_AMBULATORY_CARE_PROVIDER_SITE_OTHER): Payer: Medicare Other | Admitting: Internal Medicine

## 2017-05-27 VITALS — BP 118/68 | HR 76 | Ht 70.0 in | Wt 193.0 lb

## 2017-05-27 DIAGNOSIS — I495 Sick sinus syndrome: Secondary | ICD-10-CM | POA: Diagnosis not present

## 2017-05-27 DIAGNOSIS — I4891 Unspecified atrial fibrillation: Secondary | ICD-10-CM | POA: Diagnosis not present

## 2017-05-27 DIAGNOSIS — Z950 Presence of cardiac pacemaker: Secondary | ICD-10-CM

## 2017-05-27 NOTE — Patient Instructions (Signed)
Medication Instructions:  Your physician recommends that you continue on your current medications as directed. Please refer to the Current Medication list given to you today.  Labwork: None ordered.  Testing/Procedures: None ordered.  Follow-Up: Your physician wants you to follow-up in: one year with Dr. Lovena Le.   You will receive a reminder letter in the mail two months in advance. If you don't receive a letter, please call our office to schedule the follow-up appointment.  Remote monitoring is used to monitor your Pacemaker from home. This monitoring reduces the number of office visits required to check your device to one time per year. It allows [place] to keep an eye on the functioning of your device to ensure it is working properly. You are scheduled for a device check from home on 08/10/2017. You may send your transmission at any time that day. If you have a wireless device, the transmission will be sent automatically. After your physician reviews your transmission, you will receive a postcard with your next transmission date.   Any Other Special Instructions Will Be Listed Below (If Applicable).  If you need a refill on your cardiac medications before your next appointment, please call your pharmacy.

## 2017-05-27 NOTE — Progress Notes (Signed)
HPI Mr. Mcfann return stoday for followup. He is a pleasant 77 yo man with symptomatic sinus node dysfunction, s/p PPM, HTN, and dyslipidemia. He swims and notes that his left arm still swells while he is swimming but then goes back to normal. He does not feel his atrial fib. No edema. He has had a hip and knee replaced since I saw him last. No Known Allergies   Current Outpatient Medications  Medication Sig Dispense Refill  . atenolol (TENORMIN) 25 MG tablet Take 1 tablet (25 mg total) by mouth daily. Please keep upcoming appt in January for future refills. Thank you 90 tablet 0  . Calcium Citrate-Vitamin D (CALCIUM CITRATE + PO) Take 1 tablet by mouth daily.    Marland Kitchen docusate sodium (COLACE) 100 MG capsule Take 1 capsule (100 mg total) by mouth 2 (two) times daily. 30 capsule 0  . moxifloxacin (VIGAMOX) 0.5 % ophthalmic solution Place 1 drop into the right eye as directed.  0  . Multiple Vitamin (MULTIVITAMIN WITH MINERALS) TABS Take 1 tablet by mouth daily.    . Omega-3 Fatty Acids (FISH OIL) 300 MG CAPS Take 1 capsule by mouth daily.    Marland Kitchen oxyCODONE-acetaminophen (PERCOCET/ROXICET) 5-325 MG tablet Take 1-2 tablets by mouth every 6 (six) hours as needed for severe pain. 60 tablet 0  . prednisoLONE acetate (PRED FORTE) 1 % ophthalmic suspension Place 1 drop into the right eye as directed.  0  . PROLENSA 0.07 % SOLN Place 1 drop into the right eye daily. Starting 2 days before surgery  0  . simvastatin (ZOCOR) 40 MG tablet Take 1 tablet (40 mg total) by mouth daily. 90 tablet 2  . tiZANidine (ZANAFLEX) 2 MG tablet Take 1 tablet (2 mg total) by mouth every 8 (eight) hours as needed for muscle spasms. 50 tablet 0  . warfarin (COUMADIN) 2.5 MG tablet Take 1 tablet by mouth daily or as directed 90 tablet 1   No current facility-administered medications for this visit.      Past Medical History:  Diagnosis Date  . 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  Brady-tachy syndrome ([place])   . Chronic anticoagulation, on coumadin for PAF 06/16/2012  . Clotting disorder (HCC)    hx of DVT  . Dysrhythmia   . H/O cardiovascular stress test    normal, done as a baseline , ? where it was done, 4-5 yrs. ago  . Hyperlipidemia 06/16/2012  . Hypertension   . Pacemaker    Medtronic; implanted 06/15/12  . PAF (paroxysmal atrial fibrillation) (HCC)     ROS:   All systems reviewed and negative except as noted in the HPI.   Past Surgical History:  Procedure Laterality Date  . COLONOSCOPY    . INSERT / REPLACE / REMOVE PACEMAKER  06/15/2012   dual chamber  . KNEE ARTHROPLASTY  04/19/2012   Procedure: COMPUTER ASSISTED TOTAL KNEE ARTHROPLASTY;  Surgeon: Alta Corning, MD;  Location: [facility];  Service: Orthopedics;  Laterality: Right;  GENERAL WITH PRE OP FEMORAL NERVE BLOCK  . PERMANENT PACEMAKER INSERTION N/A 06/15/2012   Procedure: PERMANENT PACEMAKER INSERTION;  Surgeon: Sanda Klein, MD;  Location: [facility];  Service: Cardiovascular;  Laterality: N/A;  . TONSILLECTOMY    . TOTAL HIP ARTHROPLASTY Right 05/21/2016   Procedure: TOTAL HIP ARTHROPLASTY ANTERIOR APPROACH;  Surgeon: Dorna Leitz, MD;  Location: [facility];  Service: Orthopedics;  Laterality: Right;  .  TOTAL KNEE ARTHROPLASTY  04/19/2012   RIGHT KNEE     Family History  Problem Relation Age of Onset  . Heart disease Father   . Colon cancer Neg Hx   . Esophageal cancer Neg Hx   . Rectal cancer Neg Hx   . Stomach cancer Neg Hx      Social History   Socioeconomic History  . Marital status: Married    Spouse name: Not on file  . Number of children: 2  . Years of education: Not on file  . Highest education level: Not on file  Social Needs  . Financial resource strain: Not on file  . Food insecurity - worry: Not on file  . Food insecurity - inability: Not on file  . Transportation needs - medical: Not on file  . Transportation needs - non-medical: Not on file  Occupational History    . Occupation: retired  Tobacco Use  . Smoking status: Never Smoker  . Smokeless tobacco: Never Used  Substance and Sexual Activity  . Alcohol use: Yes    Comment: week- 1-2 drinks, 1-2 beers   . Drug use: No  . Sexual activity: Not on file  Other Topics Concern  . Not on file  Social History Narrative  . Not on file     BP 118/68   Pulse 76   Ht 5\' 10"  (1.778 m)   Wt 193 lb (87.5 kg)   BMI 27.69 kg/m   Physical Exam:  Well appearing 77 yo man, NAD HEENT: Unremarkable Neck:  No JVD, no thyromegally Lymphatics:  No adenopathy Back:  No CVA tenderness Lungs:  Clear with no wheezes HEART:  Regular rate rhythm, no murmurs, no rubs, no clicks Abd:  soft, positive bowel sounds, no organomegally, no rebound, no guarding Ext:  2 plus pulses, no edema, no cyanosis, no clubbing Skin:  No rashes no nodules Neuro:  CN II through XII intact, motor grossly intact  EKG - NSR with atrial pacing and RBBB  DEVICE  Normal device function.  See PaceArt for details.   Assess/Plan: 1. Sinus node dysfunction - he is doing well, s/p PPM 2. PAF - he is essentially asymptomatic and is in NSR about 99% of the time. 3. HTN - his blood pressure is well controlled. No change in meds. 4. PPM - his medtronic DDD PM has been reprogrammed today to maximize battery longevity. He has some chronically elevated atrial thresholds.  Mikle Bosworth.D.

## 2017-05-31 LAB — CUP PACEART INCLINIC DEVICE CHECK
Battery Remaining Longevity: 34 mo
Brady Statistic AP VP Percent: 0.73 %
Brady Statistic AS VP Percent: 0.1 %
Brady Statistic RA Percent Paced: 96.68 %
Brady Statistic RV Percent Paced: 0.88 %
Date Time Interrogation Session: 20190111142830
Implantable Lead Implant Date: 20140130
Implantable Lead Location: 753859
Lead Channel Impedance Value: 494 Ohm
Lead Channel Impedance Value: 608 Ohm
Lead Channel Pacing Threshold Pulse Width: 0.4 ms
Lead Channel Sensing Intrinsic Amplitude: 4.75 mV
Lead Channel Sensing Intrinsic Amplitude: 6.125 mV
Lead Channel Setting Pacing Amplitude: 2.5 V
Lead Channel Setting Pacing Amplitude: 2.5 V
Lead Channel Setting Pacing Pulse Width: 0.4 ms
Lead Channel Setting Sensing Sensitivity: 0.9 mV
MDC IDC LEAD IMPLANT DT: 20140130
MDC IDC LEAD LOCATION: 753860
MDC IDC MSMT BATTERY VOLTAGE: 2.97 V
MDC IDC MSMT LEADCHNL RA IMPEDANCE VALUE: 494 Ohm
MDC IDC MSMT LEADCHNL RA IMPEDANCE VALUE: 532 Ohm
MDC IDC MSMT LEADCHNL RA PACING THRESHOLD AMPLITUDE: 1.75 V
MDC IDC MSMT LEADCHNL RA SENSING INTR AMPL: 4.75 mV
MDC IDC MSMT LEADCHNL RV PACING THRESHOLD AMPLITUDE: 0.5 V
MDC IDC MSMT LEADCHNL RV PACING THRESHOLD PULSEWIDTH: 0.4 ms
MDC IDC MSMT LEADCHNL RV SENSING INTR AMPL: 7 mV
MDC IDC PG IMPLANT DT: 20140130
MDC IDC STAT BRADY AP VS PERCENT: 96.96 %
MDC IDC STAT BRADY AS VS PERCENT: 2.21 %

## 2017-06-02 DIAGNOSIS — H25042 Posterior subcapsular polar age-related cataract, left eye: Secondary | ICD-10-CM | POA: Diagnosis not present

## 2017-06-02 DIAGNOSIS — H25012 Cortical age-related cataract, left eye: Secondary | ICD-10-CM | POA: Diagnosis not present

## 2017-06-02 DIAGNOSIS — H52202 Unspecified astigmatism, left eye: Secondary | ICD-10-CM | POA: Diagnosis not present

## 2017-06-02 DIAGNOSIS — H2512 Age-related nuclear cataract, left eye: Secondary | ICD-10-CM | POA: Diagnosis not present

## 2017-06-02 DIAGNOSIS — H25812 Combined forms of age-related cataract, left eye: Secondary | ICD-10-CM | POA: Diagnosis not present

## 2017-06-02 DIAGNOSIS — H52222 Regular astigmatism, left eye: Secondary | ICD-10-CM | POA: Diagnosis not present

## 2017-07-11 ENCOUNTER — Ambulatory Visit (INDEPENDENT_AMBULATORY_CARE_PROVIDER_SITE_OTHER): Payer: Medicare Other | Admitting: Pharmacist Clinician (PhC)/ Clinical Pharmacy Specialist

## 2017-07-11 DIAGNOSIS — I4891 Unspecified atrial fibrillation: Secondary | ICD-10-CM | POA: Diagnosis not present

## 2017-07-11 DIAGNOSIS — Z7901 Long term (current) use of anticoagulants: Secondary | ICD-10-CM

## 2017-07-11 DIAGNOSIS — I82722 Chronic embolism and thrombosis of deep veins of left upper extremity: Secondary | ICD-10-CM

## 2017-07-11 LAB — POCT INR: INR: 1.6

## 2017-07-11 NOTE — Patient Instructions (Signed)
Description   Take 2 tablets today Monday Feb 25, then continue taking 1/2 tablet daily except 1 tablet each Monday, Wednesday and Friday.  Repeat INR in 4 weeks

## 2017-08-01 ENCOUNTER — Other Ambulatory Visit: Payer: Self-pay

## 2017-08-01 MED ORDER — ATENOLOL 25 MG PO TABS: 25.0000 mg | ORAL_TABLET | Freq: Every day | ORAL | 3 refills | Status: DC

## 2017-08-08 ENCOUNTER — Ambulatory Visit (INDEPENDENT_AMBULATORY_CARE_PROVIDER_SITE_OTHER): Payer: Medicare Other | Admitting: Pharmacist Clinician (PhC)/ Clinical Pharmacy Specialist

## 2017-08-08 DIAGNOSIS — Z7901 Long term (current) use of anticoagulants: Secondary | ICD-10-CM

## 2017-08-08 DIAGNOSIS — I4891 Unspecified atrial fibrillation: Secondary | ICD-10-CM

## 2017-08-08 DIAGNOSIS — I82722 Chronic embolism and thrombosis of deep veins of left upper extremity: Secondary | ICD-10-CM | POA: Diagnosis not present

## 2017-08-08 LAB — POCT INR: INR: 2.5

## 2017-08-10 ENCOUNTER — Ambulatory Visit (INDEPENDENT_AMBULATORY_CARE_PROVIDER_SITE_OTHER): Payer: Medicare Other | Admitting: *Deleted

## 2017-08-10 DIAGNOSIS — I495 Sick sinus syndrome: Secondary | ICD-10-CM | POA: Diagnosis not present

## 2017-08-10 NOTE — Progress Notes (Signed)
Remote pacemaker transmission.   

## 2017-08-12 ENCOUNTER — Encounter: Payer: Self-pay | Admitting: Cardiology

## 2017-08-12 LAB — CUP PACEART REMOTE DEVICE CHECK
Battery Voltage: 2.97 V
Brady Statistic AP VP Percent: 1.55 %
Brady Statistic AS VS Percent: 4.88 %
Brady Statistic RA Percent Paced: 92.1 %
Brady Statistic RV Percent Paced: 2.11 %
Date Time Interrogation Session: 20190327122618
Implantable Lead Implant Date: 20140130
Implantable Lead Implant Date: 20140130
Implantable Lead Location: 753860
Implantable Pulse Generator Implant Date: 20140130
Lead Channel Impedance Value: 513 Ohm
Lead Channel Impedance Value: 532 Ohm
Lead Channel Impedance Value: 608 Ohm
Lead Channel Pacing Threshold Amplitude: 0.625 V
Lead Channel Pacing Threshold Amplitude: 1.375 V
Lead Channel Sensing Intrinsic Amplitude: 6.375 mV
Lead Channel Setting Pacing Amplitude: 2.5 V
Lead Channel Setting Pacing Amplitude: 2.5 V
Lead Channel Setting Pacing Pulse Width: 0.4 ms
MDC IDC LEAD LOCATION: 753859
MDC IDC MSMT BATTERY REMAINING LONGEVITY: 37 mo
MDC IDC MSMT LEADCHNL RA IMPEDANCE VALUE: 475 Ohm
MDC IDC MSMT LEADCHNL RA PACING THRESHOLD PULSEWIDTH: 0.4 ms
MDC IDC MSMT LEADCHNL RA SENSING INTR AMPL: 4.625 mV
MDC IDC MSMT LEADCHNL RA SENSING INTR AMPL: 4.625 mV
MDC IDC MSMT LEADCHNL RV PACING THRESHOLD PULSEWIDTH: 0.4 ms
MDC IDC MSMT LEADCHNL RV SENSING INTR AMPL: 6.375 mV
MDC IDC SET LEADCHNL RV SENSING SENSITIVITY: 0.9 mV
MDC IDC STAT BRADY AP VS PERCENT: 93.32 %
MDC IDC STAT BRADY AS VP PERCENT: 0.25 %

## 2017-09-01 DIAGNOSIS — M25551 Pain in right hip: Secondary | ICD-10-CM | POA: Diagnosis not present

## 2017-09-01 DIAGNOSIS — Z96641 Presence of right artificial hip joint: Secondary | ICD-10-CM | POA: Diagnosis not present

## 2017-09-19 ENCOUNTER — Ambulatory Visit (INDEPENDENT_AMBULATORY_CARE_PROVIDER_SITE_OTHER): Payer: Medicare Other | Admitting: Pharmacist Clinician (PhC)/ Clinical Pharmacy Specialist

## 2017-09-19 DIAGNOSIS — I82722 Chronic embolism and thrombosis of deep veins of left upper extremity: Secondary | ICD-10-CM | POA: Diagnosis not present

## 2017-09-19 DIAGNOSIS — Z7901 Long term (current) use of anticoagulants: Secondary | ICD-10-CM | POA: Diagnosis not present

## 2017-09-19 DIAGNOSIS — I4891 Unspecified atrial fibrillation: Secondary | ICD-10-CM | POA: Diagnosis not present

## 2017-09-19 LAB — POCT INR: INR: 2.5

## 2017-09-26 DIAGNOSIS — S51812A Laceration without foreign body of left forearm, initial encounter: Secondary | ICD-10-CM | POA: Diagnosis not present

## 2017-10-03 DIAGNOSIS — Z4802 Encounter for removal of sutures: Secondary | ICD-10-CM | POA: Diagnosis not present

## 2017-10-03 DIAGNOSIS — S51812D Laceration without foreign body of left forearm, subsequent encounter: Secondary | ICD-10-CM | POA: Diagnosis not present

## 2017-10-11 DIAGNOSIS — Z08 Encounter for follow-up examination after completed treatment for malignant neoplasm: Secondary | ICD-10-CM | POA: Diagnosis not present

## 2017-10-11 DIAGNOSIS — M67912 Unspecified disorder of synovium and tendon, left shoulder: Secondary | ICD-10-CM | POA: Diagnosis not present

## 2017-10-11 DIAGNOSIS — L570 Actinic keratosis: Secondary | ICD-10-CM | POA: Diagnosis not present

## 2017-10-11 DIAGNOSIS — Z85828 Personal history of other malignant neoplasm of skin: Secondary | ICD-10-CM | POA: Diagnosis not present

## 2017-10-11 DIAGNOSIS — X32XXXD Exposure to sunlight, subsequent encounter: Secondary | ICD-10-CM | POA: Diagnosis not present

## 2017-10-26 ENCOUNTER — Ambulatory Visit (INDEPENDENT_AMBULATORY_CARE_PROVIDER_SITE_OTHER): Payer: Medicare Other | Admitting: Pharmacist

## 2017-10-26 DIAGNOSIS — I82722 Chronic embolism and thrombosis of deep veins of left upper extremity: Secondary | ICD-10-CM | POA: Diagnosis not present

## 2017-10-26 DIAGNOSIS — I4891 Unspecified atrial fibrillation: Secondary | ICD-10-CM

## 2017-10-26 DIAGNOSIS — Z7901 Long term (current) use of anticoagulants: Secondary | ICD-10-CM | POA: Diagnosis not present

## 2017-10-26 LAB — POCT INR: INR: 2.8 (ref 2.0–3.0)

## 2017-11-08 DIAGNOSIS — M67912 Unspecified disorder of synovium and tendon, left shoulder: Secondary | ICD-10-CM | POA: Diagnosis not present

## 2017-11-08 DIAGNOSIS — M25512 Pain in left shoulder: Secondary | ICD-10-CM | POA: Diagnosis not present

## 2017-11-09 ENCOUNTER — Ambulatory Visit (INDEPENDENT_AMBULATORY_CARE_PROVIDER_SITE_OTHER): Payer: Medicare Other | Admitting: *Deleted

## 2017-11-09 DIAGNOSIS — I495 Sick sinus syndrome: Secondary | ICD-10-CM | POA: Diagnosis not present

## 2017-11-09 NOTE — Progress Notes (Signed)
Remote pacemaker transmission.   

## 2017-11-10 ENCOUNTER — Encounter: Payer: Self-pay | Admitting: Cardiology

## 2017-11-10 LAB — CUP PACEART REMOTE DEVICE CHECK
Battery Remaining Longevity: 33 mo
Battery Voltage: 2.96 V
Brady Statistic AP VP Percent: 3.03 %
Brady Statistic AS VS Percent: 4.05 %
Brady Statistic RA Percent Paced: 93.42 %
Brady Statistic RV Percent Paced: 3.5 %
Date Time Interrogation Session: 20190626110747
Implantable Lead Implant Date: 20140130
Implantable Lead Implant Date: 20140130
Implantable Lead Location: 753860
Implantable Pulse Generator Implant Date: 20140130
Lead Channel Impedance Value: 494 Ohm
Lead Channel Impedance Value: 551 Ohm
Lead Channel Impedance Value: 589 Ohm
Lead Channel Pacing Threshold Amplitude: 0.625 V
Lead Channel Pacing Threshold Amplitude: 1.5 V
Lead Channel Sensing Intrinsic Amplitude: 4.625 mV
Lead Channel Setting Pacing Amplitude: 2.5 V
Lead Channel Setting Sensing Sensitivity: 0.9 mV
MDC IDC LEAD LOCATION: 753859
MDC IDC MSMT LEADCHNL RA IMPEDANCE VALUE: 494 Ohm
MDC IDC MSMT LEADCHNL RA PACING THRESHOLD PULSEWIDTH: 0.4 ms
MDC IDC MSMT LEADCHNL RA SENSING INTR AMPL: 4.625 mV
MDC IDC MSMT LEADCHNL RV PACING THRESHOLD PULSEWIDTH: 0.4 ms
MDC IDC MSMT LEADCHNL RV SENSING INTR AMPL: 7.875 mV
MDC IDC MSMT LEADCHNL RV SENSING INTR AMPL: 7.875 mV
MDC IDC SET LEADCHNL RV PACING AMPLITUDE: 2.5 V
MDC IDC SET LEADCHNL RV PACING PULSEWIDTH: 0.4 ms
MDC IDC STAT BRADY AP VS PERCENT: 92.79 %
MDC IDC STAT BRADY AS VP PERCENT: 0.14 %

## 2017-12-05 ENCOUNTER — Ambulatory Visit (INDEPENDENT_AMBULATORY_CARE_PROVIDER_SITE_OTHER): Payer: Medicare Other | Admitting: Pharmacist

## 2017-12-05 DIAGNOSIS — Z7901 Long term (current) use of anticoagulants: Secondary | ICD-10-CM

## 2017-12-05 DIAGNOSIS — I82722 Chronic embolism and thrombosis of deep veins of left upper extremity: Secondary | ICD-10-CM | POA: Diagnosis not present

## 2017-12-05 DIAGNOSIS — I4891 Unspecified atrial fibrillation: Secondary | ICD-10-CM

## 2017-12-05 LAB — POCT INR: INR: 3.7 — AB (ref 2.0–3.0)

## 2017-12-12 ENCOUNTER — Other Ambulatory Visit: Payer: Self-pay | Admitting: Pharmacist Clinician (PhC)/ Clinical Pharmacy Specialist

## 2017-12-12 MED ORDER — WARFARIN SODIUM 2.5 MG PO TABS: ORAL_TABLET | ORAL | 1 refills | Status: DC

## 2018-01-13 ENCOUNTER — Ambulatory Visit (INDEPENDENT_AMBULATORY_CARE_PROVIDER_SITE_OTHER): Payer: Medicare Other | Admitting: Pharmacist

## 2018-01-13 DIAGNOSIS — Z7901 Long term (current) use of anticoagulants: Secondary | ICD-10-CM | POA: Diagnosis not present

## 2018-01-13 DIAGNOSIS — I4891 Unspecified atrial fibrillation: Secondary | ICD-10-CM

## 2018-01-13 DIAGNOSIS — I82722 Chronic embolism and thrombosis of deep veins of left upper extremity: Secondary | ICD-10-CM

## 2018-01-13 LAB — POCT INR: INR: 2.3 (ref 2.0–3.0)

## 2018-02-08 ENCOUNTER — Ambulatory Visit (INDEPENDENT_AMBULATORY_CARE_PROVIDER_SITE_OTHER): Payer: Medicare Other | Admitting: *Deleted

## 2018-02-08 DIAGNOSIS — I495 Sick sinus syndrome: Secondary | ICD-10-CM

## 2018-02-08 NOTE — Progress Notes (Signed)
Remote pacemaker transmission.   

## 2018-02-09 ENCOUNTER — Encounter: Payer: Self-pay | Admitting: Cardiology

## 2018-02-23 DIAGNOSIS — M5441 Lumbago with sciatica, right side: Secondary | ICD-10-CM | POA: Diagnosis not present

## 2018-02-23 DIAGNOSIS — M545 Low back pain: Secondary | ICD-10-CM | POA: Diagnosis not present

## 2018-02-23 DIAGNOSIS — M25551 Pain in right hip: Secondary | ICD-10-CM | POA: Diagnosis not present

## 2018-02-27 ENCOUNTER — Ambulatory Visit (INDEPENDENT_AMBULATORY_CARE_PROVIDER_SITE_OTHER): Payer: Medicare Other | Admitting: Pharmacist Clinician (PhC)/ Clinical Pharmacy Specialist

## 2018-02-27 DIAGNOSIS — I82722 Chronic embolism and thrombosis of deep veins of left upper extremity: Secondary | ICD-10-CM

## 2018-02-27 DIAGNOSIS — I4891 Unspecified atrial fibrillation: Secondary | ICD-10-CM | POA: Diagnosis not present

## 2018-02-27 DIAGNOSIS — Z7901 Long term (current) use of anticoagulants: Secondary | ICD-10-CM | POA: Diagnosis not present

## 2018-02-27 LAB — POCT INR: INR: 2.8 (ref 2.0–3.0)

## 2018-02-27 NOTE — Patient Instructions (Signed)
Description   Continue taking 1/2 tablet daily except 1 tablet each Monday, Wednesday and Friday.  Repeat INR in 6 weeks

## 2018-02-28 LAB — CUP PACEART REMOTE DEVICE CHECK
Battery Voltage: 2.95 V
Brady Statistic AP VP Percent: 2.91 %
Brady Statistic AP VS Percent: 92.63 %
Brady Statistic AS VP Percent: 0.23 %
Date Time Interrogation Session: 20190925112319
Implantable Lead Implant Date: 20140130
Implantable Lead Location: 753859
Lead Channel Impedance Value: 494 Ohm
Lead Channel Impedance Value: 513 Ohm
Lead Channel Pacing Threshold Amplitude: 0.5 V
Lead Channel Pacing Threshold Pulse Width: 0.4 ms
Lead Channel Sensing Intrinsic Amplitude: 4.125 mV
Lead Channel Sensing Intrinsic Amplitude: 5.625 mV
Lead Channel Sensing Intrinsic Amplitude: 5.625 mV
Lead Channel Setting Pacing Amplitude: 2.5 V
Lead Channel Setting Pacing Pulse Width: 0.4 ms
MDC IDC LEAD IMPLANT DT: 20140130
MDC IDC LEAD LOCATION: 753860
MDC IDC MSMT BATTERY REMAINING LONGEVITY: 35 mo
MDC IDC MSMT LEADCHNL RA IMPEDANCE VALUE: 570 Ohm
MDC IDC MSMT LEADCHNL RA PACING THRESHOLD AMPLITUDE: 1.5 V
MDC IDC MSMT LEADCHNL RA PACING THRESHOLD PULSEWIDTH: 0.4 ms
MDC IDC MSMT LEADCHNL RA SENSING INTR AMPL: 4.125 mV
MDC IDC MSMT LEADCHNL RV IMPEDANCE VALUE: 589 Ohm
MDC IDC PG IMPLANT DT: 20140130
MDC IDC SET LEADCHNL RA PACING AMPLITUDE: 2.5 V
MDC IDC SET LEADCHNL RV SENSING SENSITIVITY: 0.9 mV
MDC IDC STAT BRADY AS VS PERCENT: 4.23 %
MDC IDC STAT BRADY RA PERCENT PACED: 93.06 %
MDC IDC STAT BRADY RV PERCENT PACED: 3.45 %

## 2018-04-07 ENCOUNTER — Ambulatory Visit (INDEPENDENT_AMBULATORY_CARE_PROVIDER_SITE_OTHER): Payer: Medicare Other | Admitting: Pharmacist Clinician (PhC)/ Clinical Pharmacy Specialist

## 2018-04-07 DIAGNOSIS — I4891 Unspecified atrial fibrillation: Secondary | ICD-10-CM | POA: Diagnosis not present

## 2018-04-07 DIAGNOSIS — Z7901 Long term (current) use of anticoagulants: Secondary | ICD-10-CM | POA: Diagnosis not present

## 2018-04-07 DIAGNOSIS — I82722 Chronic embolism and thrombosis of deep veins of left upper extremity: Secondary | ICD-10-CM

## 2018-04-07 LAB — POCT INR: INR: 2.8 (ref 2.0–3.0)

## 2018-04-25 DIAGNOSIS — I4891 Unspecified atrial fibrillation: Secondary | ICD-10-CM | POA: Diagnosis not present

## 2018-04-25 DIAGNOSIS — Z125 Encounter for screening for malignant neoplasm of prostate: Secondary | ICD-10-CM | POA: Diagnosis not present

## 2018-04-25 DIAGNOSIS — E7849 Other hyperlipidemia: Secondary | ICD-10-CM | POA: Diagnosis not present

## 2018-04-25 DIAGNOSIS — R82998 Other abnormal findings in urine: Secondary | ICD-10-CM | POA: Diagnosis not present

## 2018-05-01 DIAGNOSIS — R030 Elevated blood-pressure reading, without diagnosis of hypertension: Secondary | ICD-10-CM | POA: Diagnosis not present

## 2018-05-01 DIAGNOSIS — Z0000 Encounter for general adult medical examination without abnormal findings: Secondary | ICD-10-CM | POA: Diagnosis not present

## 2018-05-01 DIAGNOSIS — M1990 Unspecified osteoarthritis, unspecified site: Secondary | ICD-10-CM | POA: Diagnosis not present

## 2018-05-01 DIAGNOSIS — I4891 Unspecified atrial fibrillation: Secondary | ICD-10-CM | POA: Diagnosis not present

## 2018-05-01 DIAGNOSIS — E7849 Other hyperlipidemia: Secondary | ICD-10-CM | POA: Diagnosis not present

## 2018-05-01 DIAGNOSIS — Z1212 Encounter for screening for malignant neoplasm of rectum: Secondary | ICD-10-CM | POA: Diagnosis not present

## 2018-05-01 DIAGNOSIS — E663 Overweight: Secondary | ICD-10-CM | POA: Diagnosis not present

## 2018-05-01 DIAGNOSIS — Z1389 Encounter for screening for other disorder: Secondary | ICD-10-CM | POA: Diagnosis not present

## 2018-05-01 DIAGNOSIS — Z7901 Long term (current) use of anticoagulants: Secondary | ICD-10-CM | POA: Diagnosis not present

## 2018-05-01 DIAGNOSIS — M5380 Other specified dorsopathies, site unspecified: Secondary | ICD-10-CM | POA: Diagnosis not present

## 2018-05-01 DIAGNOSIS — Z950 Presence of cardiac pacemaker: Secondary | ICD-10-CM | POA: Diagnosis not present

## 2018-05-05 ENCOUNTER — Telehealth: Payer: Self-pay | Admitting: *Deleted

## 2018-05-05 NOTE — Telephone Encounter (Signed)
Per device clinic move appt to PM time. lmtcb [phone]

## 2018-05-11 ENCOUNTER — Ambulatory Visit (INDEPENDENT_AMBULATORY_CARE_PROVIDER_SITE_OTHER): Payer: Medicare Other

## 2018-05-11 DIAGNOSIS — I495 Sick sinus syndrome: Secondary | ICD-10-CM | POA: Diagnosis not present

## 2018-05-11 NOTE — Progress Notes (Signed)
Remote pacemaker transmission.   

## 2018-05-12 LAB — CUP PACEART REMOTE DEVICE CHECK
Battery Remaining Longevity: 34 mo
Brady Statistic AP VP Percent: 2.25 %
Brady Statistic AP VS Percent: 91.65 %
Brady Statistic AS VP Percent: 0.19 %
Brady Statistic AS VS Percent: 5.92 %
Brady Statistic RV Percent Paced: 2.7 %
Implantable Lead Implant Date: 20140130
Implantable Lead Location: 753860
Lead Channel Impedance Value: 494 Ohm
Lead Channel Impedance Value: 513 Ohm
Lead Channel Impedance Value: 551 Ohm
Lead Channel Impedance Value: 589 Ohm
Lead Channel Pacing Threshold Amplitude: 0.5 V
Lead Channel Pacing Threshold Pulse Width: 0.4 ms
Lead Channel Sensing Intrinsic Amplitude: 3.875 mV
Lead Channel Sensing Intrinsic Amplitude: 5.625 mV
Lead Channel Sensing Intrinsic Amplitude: 5.625 mV
Lead Channel Setting Pacing Amplitude: 2.5 V
Lead Channel Setting Pacing Amplitude: 2.5 V
Lead Channel Setting Pacing Pulse Width: 0.4 ms
Lead Channel Setting Sensing Sensitivity: 0.9 mV
MDC IDC LEAD IMPLANT DT: 20140130
MDC IDC LEAD LOCATION: 753859
MDC IDC MSMT BATTERY VOLTAGE: 2.95 V
MDC IDC MSMT LEADCHNL RA PACING THRESHOLD AMPLITUDE: 1.5 V
MDC IDC MSMT LEADCHNL RA PACING THRESHOLD PULSEWIDTH: 0.4 ms
MDC IDC MSMT LEADCHNL RA SENSING INTR AMPL: 3.875 mV
MDC IDC PG IMPLANT DT: 20140130
MDC IDC SESS DTM: 20191226155303
MDC IDC STAT BRADY RA PERCENT PACED: 91.19 %

## 2018-05-22 ENCOUNTER — Ambulatory Visit (INDEPENDENT_AMBULATORY_CARE_PROVIDER_SITE_OTHER): Payer: Medicare Other | Admitting: Pharmacist Clinician (PhC)/ Clinical Pharmacy Specialist

## 2018-05-22 DIAGNOSIS — I82722 Chronic embolism and thrombosis of deep veins of left upper extremity: Secondary | ICD-10-CM

## 2018-05-22 DIAGNOSIS — Z7901 Long term (current) use of anticoagulants: Secondary | ICD-10-CM | POA: Diagnosis not present

## 2018-05-22 DIAGNOSIS — I4891 Unspecified atrial fibrillation: Secondary | ICD-10-CM

## 2018-05-22 LAB — POCT INR: INR: 3.7 — AB (ref 2.0–3.0)

## 2018-05-22 NOTE — Patient Instructions (Signed)
Description   No warfarin today Monday Jan 6, then continue taking 1/2 tablet daily except 1 tablet each Monday, Wednesday and Friday.  Repeat INR in 4 weeks

## 2018-05-23 ENCOUNTER — Encounter: Payer: Medicare Other | Admitting: Internal Medicine

## 2018-05-29 ENCOUNTER — Ambulatory Visit (INDEPENDENT_AMBULATORY_CARE_PROVIDER_SITE_OTHER): Payer: Medicare Other | Admitting: Internal Medicine

## 2018-05-29 ENCOUNTER — Encounter: Payer: Self-pay | Admitting: Internal Medicine

## 2018-05-29 VITALS — BP 136/84 | HR 102 | Ht 70.0 in | Wt 191.8 lb

## 2018-05-29 DIAGNOSIS — I495 Sick sinus syndrome: Secondary | ICD-10-CM

## 2018-05-29 DIAGNOSIS — I480 Paroxysmal atrial fibrillation: Secondary | ICD-10-CM | POA: Diagnosis not present

## 2018-05-29 DIAGNOSIS — Z950 Presence of cardiac pacemaker: Secondary | ICD-10-CM

## 2018-05-29 DIAGNOSIS — I10 Essential (primary) hypertension: Secondary | ICD-10-CM

## 2018-05-29 NOTE — Patient Instructions (Addendum)
Medication Instructions:  Your physician recommends that you continue on your current medications as directed. Please refer to the Current Medication list given to you today.  Labwork: None ordered.  Testing/Procedures: None ordered.  Follow-Up: Your physician wants you to follow-up in: one year with Dr. Lovena Le.   You will receive a reminder letter in the mail two months in advance. If you don't receive a letter, please call our office to schedule the follow-up appointment.  Remote monitoring is used to monitor your Pacemaker from home. This monitoring reduces the number of office visits required to check your device to one time per year. It allows [place] to keep an eye on the functioning of your device to ensure it is working properly. You are scheduled for a device check from home on 08/10/2018. You may send your transmission at any time that day. If you have a wireless device, the transmission will be sent automatically. After your physician reviews your transmission, you will receive a postcard with your next transmission date.  Any Other Special Instructions Will Be Listed Below (If Applicable).  If you need a refill on your cardiac medications before your next appointment, please call your pharmacy.

## 2018-05-29 NOTE — Progress Notes (Signed)
HPI Richard [name] return stoday for followup. He is a pleasant 78 yo man with symptomatic sinus node dysfunction, s/p PPM, HTN, and dyslipidemia. He swims and notes that his left arm still swells while he is swimming but then goes back to normal. He does not feel his atrial fib. No edema. No chest pain or sob. No Known Allergies   Current Outpatient Medications  Medication Sig Dispense Refill  . atenolol (TENORMIN) 25 MG tablet Take 1 tablet (25 mg total) by mouth daily. 90 tablet 3  . Calcium Citrate-Vitamin D (CALCIUM CITRATE + PO) Take 1 tablet by mouth daily.    Marland Kitchen docusate sodium (COLACE) 100 MG capsule Take 1 capsule (100 mg total) by mouth 2 (two) times daily. 30 capsule 0  . moxifloxacin (VIGAMOX) 0.5 % ophthalmic solution Place 1 drop into the right eye as directed.  0  . Multiple Vitamin (MULTIVITAMIN WITH MINERALS) TABS Take 1 tablet by mouth daily.    . Omega-3 Fatty Acids (FISH OIL) 300 MG CAPS Take 1 capsule by mouth daily.    Marland Kitchen oxyCODONE-acetaminophen (PERCOCET/ROXICET) 5-325 MG tablet Take 1-2 tablets by mouth every 6 (six) hours as needed for severe pain. 60 tablet 0  . prednisoLONE acetate (PRED FORTE) 1 % ophthalmic suspension Place 1 drop into the right eye as directed.  0  . PROLENSA 0.07 % SOLN Place 1 drop into the right eye daily. Starting 2 days before surgery  0  . simvastatin (ZOCOR) 40 MG tablet Take 1 tablet (40 mg total) by mouth daily. 90 tablet 2  . tiZANidine (ZANAFLEX) 2 MG tablet Take 1 tablet (2 mg total) by mouth every 8 (eight) hours as needed for muscle spasms. 50 tablet 0  . warfarin (COUMADIN) 2.5 MG tablet Take 1 tablet by mouth daily or as directed 90 tablet 1   No current facility-administered medications for this visit.      Past Medical History:  Diagnosis Date  . 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 Chronic anticoagulation, on coumadin for PAF 06/16/2012  . Clotting disorder  (HCC)    hx of DVT  . Dysrhythmia   . H/O cardiovascular stress test    normal, done as a baseline , ? where it was done, 4-5 yrs. ago  . Hyperlipidemia 06/16/2012  . Hypertension   . Pacemaker    Medtronic; implanted 06/15/12  . PAF (paroxysmal atrial fibrillation) (HCC)     ROS:   All systems reviewed and negative except as noted in the HPI.   Past Surgical History:  Procedure Laterality Date  . COLONOSCOPY    . INSERT / REPLACE / REMOVE PACEMAKER  06/15/2012   dual chamber  . KNEE ARTHROPLASTY  04/19/2012   Procedure: COMPUTER ASSISTED TOTAL KNEE ARTHROPLASTY;  Surgeon: Richard Corning, MD;  Location: [facility];  Service: Orthopedics;  Laterality: Right;  GENERAL WITH PRE OP FEMORAL NERVE BLOCK  . PERMANENT PACEMAKER INSERTION N/A 06/15/2012   Procedure: PERMANENT PACEMAKER INSERTION;  Surgeon: Richard Klein, MD;  Location: [facility] CATH LAB;  Service: Cardiovascular;  Laterality: N/A;  . TONSILLECTOMY    . TOTAL HIP ARTHROPLASTY Right 05/21/2016   Procedure: TOTAL HIP ARTHROPLASTY ANTERIOR APPROACH;  Surgeon: Richard Leitz, MD;  Location: [facility];  Service: Orthopedics;  Laterality: Right;  . TOTAL KNEE ARTHROPLASTY  04/19/2012   RIGHT KNEE     Family History  Problem Relation Age  of Onset  . Heart disease Father   . Colon cancer Neg Hx   . Esophageal cancer Neg Hx   . Rectal cancer Neg Hx   . Stomach cancer Neg Hx      Social History   Socioeconomic History  . Marital status: Married    Spouse name: Not on file  . Number of children: 2  . Years of education: Not on file  . Highest education level: Not on file  Occupational History  . Occupation: retired  Scientific laboratory technician  . Financial resource strain: Not on file  . Food insecurity:    Worry: Not on file    Inability: Not on file  . Transportation needs:    Medical: Not on file    Non-medical: Not on file  Tobacco Use  . Smoking status: Never Smoker  . Smokeless tobacco: Never Used  Substance and Sexual Activity  .  Alcohol use: Yes    Comment: week- 1-2 drinks, 1-2 beers   . Drug use: No  . Sexual activity: Not on file  Lifestyle  . Physical activity:    Days per week: Not on file    Minutes per session: Not on file  . Stress: Not on file  Relationships  . Social connections:    Talks on phone: Not on file    Gets together: Not on file    Attends religious service: Not on file    Active member of club or organization: Not on file    Attends meetings of clubs or organizations: Not on file    Relationship status: Not on file  . Intimate partner violence:    Fear of current or ex partner: Not on file    Emotionally abused: Not on file    Physically abused: Not on file    Forced sexual activity: Not on file  Other Topics Concern  . Not on file  Social History Narrative  . Not on file     BP 136/84   Pulse (!) 102   Ht 5\' 10"  (1.778 m)   Wt 191 lb 12.8 oz (87 kg)   SpO2 97%   BMI 27.52 kg/m   Physical Exam:  Well appearing 78 yo man, NAD HEENT: Unremarkable Neck:  No JVD, no thyromegally Lymphatics:  No adenopathy Back:  No CVA tenderness Lungs:  Clear with no wheezes HEART:  Regular rate rhythm, no murmurs, no rubs, no clicks Abd:  soft, positive bowel sounds, no organomegally, no rebound, no guarding Ext:  2 plus pulses, no edema, no cyanosis, no clubbing Skin:  No rashes no nodules Neuro:  CN II through XII intact, motor grossly intact  ECG - NSR with atrial pacing and RBBB  DEVICE  Normal device function.  See PaceArt for details.   Assess/Plan: 1. Sinus node dysfunction - he is s/p PPM and is asymptomatic. 2. HTN - his blood pressure is reasonably well controlled. No change in his meds. 3. PAF - he is maintaining NSR 99% of the time. He will continue his current meds with atenolol.  Richard [name].D.

## 2018-05-31 ENCOUNTER — Encounter: Payer: Medicare Other | Admitting: Internal Medicine

## 2018-06-21 ENCOUNTER — Ambulatory Visit (INDEPENDENT_AMBULATORY_CARE_PROVIDER_SITE_OTHER): Payer: Medicare Other | Admitting: Pharmacist

## 2018-06-21 DIAGNOSIS — Z7901 Long term (current) use of anticoagulants: Secondary | ICD-10-CM | POA: Diagnosis not present

## 2018-06-21 DIAGNOSIS — I82722 Chronic embolism and thrombosis of deep veins of left upper extremity: Secondary | ICD-10-CM | POA: Diagnosis not present

## 2018-06-21 DIAGNOSIS — I480 Paroxysmal atrial fibrillation: Secondary | ICD-10-CM

## 2018-06-21 LAB — POCT INR: INR: 3.7 — AB (ref 2.0–3.0)

## 2018-07-13 ENCOUNTER — Ambulatory Visit (INDEPENDENT_AMBULATORY_CARE_PROVIDER_SITE_OTHER): Payer: Medicare Other | Admitting: Pharmacist Clinician (PhC)/ Clinical Pharmacy Specialist

## 2018-07-13 DIAGNOSIS — Z7901 Long term (current) use of anticoagulants: Secondary | ICD-10-CM

## 2018-07-13 DIAGNOSIS — I82722 Chronic embolism and thrombosis of deep veins of left upper extremity: Secondary | ICD-10-CM

## 2018-07-13 DIAGNOSIS — I480 Paroxysmal atrial fibrillation: Secondary | ICD-10-CM | POA: Diagnosis not present

## 2018-07-13 LAB — POCT INR: INR: 2.5 (ref 2.0–3.0)

## 2018-07-24 DIAGNOSIS — H26493 Other secondary cataract, bilateral: Secondary | ICD-10-CM | POA: Diagnosis not present

## 2018-07-24 DIAGNOSIS — H43813 Vitreous degeneration, bilateral: Secondary | ICD-10-CM | POA: Diagnosis not present

## 2018-07-24 DIAGNOSIS — H35371 Puckering of macula, right eye: Secondary | ICD-10-CM | POA: Diagnosis not present

## 2018-07-24 DIAGNOSIS — Z961 Presence of intraocular lens: Secondary | ICD-10-CM | POA: Diagnosis not present

## 2018-07-26 LAB — CUP PACEART INCLINIC DEVICE CHECK
Brady Statistic AP VP Percent: 2.4 %
Brady Statistic AS VP Percent: 0.2 %
Brady Statistic AS VS Percent: 4.79 %
Brady Statistic RV Percent Paced: 2.89 %
Date Time Interrogation Session: 20200113211842
Implantable Lead Implant Date: 20140130
Implantable Lead Location: 753860
Implantable Pulse Generator Implant Date: 20140130
Lead Channel Impedance Value: 494 Ohm
Lead Channel Impedance Value: 570 Ohm
Lead Channel Impedance Value: 589 Ohm
Lead Channel Pacing Threshold Amplitude: 0.5 V
Lead Channel Pacing Threshold Pulse Width: 0.4 ms
Lead Channel Setting Pacing Amplitude: 2.5 V
Lead Channel Setting Pacing Amplitude: 2.5 V
Lead Channel Setting Sensing Sensitivity: 0.9 mV
MDC IDC LEAD IMPLANT DT: 20140130
MDC IDC LEAD LOCATION: 753859
MDC IDC MSMT BATTERY REMAINING LONGEVITY: 31 mo
MDC IDC MSMT BATTERY VOLTAGE: 2.95 V
MDC IDC MSMT LEADCHNL RA IMPEDANCE VALUE: 513 Ohm
MDC IDC MSMT LEADCHNL RA PACING THRESHOLD AMPLITUDE: 1.25 V
MDC IDC MSMT LEADCHNL RA PACING THRESHOLD PULSEWIDTH: 0.8 ms
MDC IDC MSMT LEADCHNL RA SENSING INTR AMPL: 3.875 mV
MDC IDC MSMT LEADCHNL RV SENSING INTR AMPL: 6.125 mV
MDC IDC SET LEADCHNL RV PACING PULSEWIDTH: 0.4 ms
MDC IDC STAT BRADY AP VS PERCENT: 92.62 %
MDC IDC STAT BRADY RA PERCENT PACED: 92.47 %

## 2018-08-10 ENCOUNTER — Ambulatory Visit (INDEPENDENT_AMBULATORY_CARE_PROVIDER_SITE_OTHER): Payer: Medicare Other | Admitting: *Deleted

## 2018-08-10 ENCOUNTER — Other Ambulatory Visit: Payer: Self-pay

## 2018-08-10 DIAGNOSIS — I495 Sick sinus syndrome: Secondary | ICD-10-CM

## 2018-08-10 LAB — CUP PACEART REMOTE DEVICE CHECK
Battery Voltage: 2.94 V
Brady Statistic AP VP Percent: 1.4 %
Brady Statistic AS VS Percent: 4.6 %
Brady Statistic RA Percent Paced: 93.03 %
Brady Statistic RV Percent Paced: 1.74 %
Implantable Lead Implant Date: 20140130
Lead Channel Impedance Value: 513 Ohm
Lead Channel Impedance Value: 608 Ohm
Lead Channel Pacing Threshold Amplitude: 0.625 V
Lead Channel Pacing Threshold Pulse Width: 0.4 ms
Lead Channel Sensing Intrinsic Amplitude: 4.25 mV
Lead Channel Sensing Intrinsic Amplitude: 6.125 mV
Lead Channel Setting Pacing Amplitude: 2.5 V
Lead Channel Setting Pacing Amplitude: 2.5 V
Lead Channel Setting Pacing Pulse Width: 0.4 ms
Lead Channel Setting Sensing Sensitivity: 0.9 mV
MDC IDC LEAD IMPLANT DT: 20140130
MDC IDC LEAD LOCATION: 753859
MDC IDC LEAD LOCATION: 753860
MDC IDC MSMT BATTERY REMAINING LONGEVITY: 29 mo
MDC IDC MSMT LEADCHNL RA IMPEDANCE VALUE: 532 Ohm
MDC IDC MSMT LEADCHNL RA IMPEDANCE VALUE: 589 Ohm
MDC IDC MSMT LEADCHNL RA PACING THRESHOLD AMPLITUDE: 1.375 V
MDC IDC MSMT LEADCHNL RA SENSING INTR AMPL: 4.25 mV
MDC IDC MSMT LEADCHNL RV PACING THRESHOLD PULSEWIDTH: 0.4 ms
MDC IDC MSMT LEADCHNL RV SENSING INTR AMPL: 6.125 mV
MDC IDC PG IMPLANT DT: 20140130
MDC IDC SESS DTM: 20200326102953
MDC IDC STAT BRADY AP VS PERCENT: 93.87 %
MDC IDC STAT BRADY AS VP PERCENT: 0.13 %

## 2018-08-13 ENCOUNTER — Other Ambulatory Visit: Payer: Self-pay | Admitting: Internal Medicine

## 2018-08-15 ENCOUNTER — Encounter: Payer: Self-pay | Admitting: Cardiology

## 2018-08-15 NOTE — Progress Notes (Signed)
Remote pacemaker transmission.   

## 2018-08-25 ENCOUNTER — Telehealth: Payer: Self-pay | Admitting: *Deleted

## 2018-08-25 ENCOUNTER — Telehealth: Payer: Self-pay

## 2018-08-25 ENCOUNTER — Other Ambulatory Visit: Payer: Self-pay | Admitting: Internal Medicine

## 2018-08-25 NOTE — Telephone Encounter (Signed)
1. Do you currently have a fever? NO (yes = cancel and refer to pcp for e-visit) 2. Have you recently travelled on a cruise, internationally, or to [place], [place], [place], [place], [place], or [place], [place] [employer])? No (yes = cancel, stay home, monitor symptoms, and contact pcp or initiate e-visit if symptoms develop) 3. Have you been in contact with someone that is currently pending confirmation of Covid19 testing or has been confirmed to have the [place] virus?  No (yes = cancel, stay home, away from tested individual, monitor symptoms, and contact pcp or initiate e-visit if symptoms develop) 4. Are you currently experiencing fatigue or cough? No (yes = pt should be prepared to have a mask placed at the time of their visit).  Pt. Advised that we are restricting visitors at this time and anyone present in the vehicle should meet the above criteria as well. Advised that visit will be at curbside for finger stick ONLY and will receive call with instructions. Pt also advised to please bring own pen for signature of arrival document.

## 2018-08-25 NOTE — Telephone Encounter (Signed)
lmom for prescreen/drive thru 

## 2018-08-28 ENCOUNTER — Ambulatory Visit (INDEPENDENT_AMBULATORY_CARE_PROVIDER_SITE_OTHER): Payer: Medicare Other | Admitting: *Deleted

## 2018-08-28 ENCOUNTER — Other Ambulatory Visit: Payer: Self-pay

## 2018-08-28 DIAGNOSIS — I480 Paroxysmal atrial fibrillation: Secondary | ICD-10-CM | POA: Diagnosis not present

## 2018-08-28 DIAGNOSIS — Z7901 Long term (current) use of anticoagulants: Secondary | ICD-10-CM

## 2018-08-28 DIAGNOSIS — I82722 Chronic embolism and thrombosis of deep veins of left upper extremity: Secondary | ICD-10-CM

## 2018-08-28 LAB — POCT INR: INR: 2.9 (ref 2.0–3.0)

## 2018-10-06 ENCOUNTER — Telehealth: Payer: Self-pay

## 2018-10-06 NOTE — Telephone Encounter (Signed)

## 2018-10-10 ENCOUNTER — Ambulatory Visit (INDEPENDENT_AMBULATORY_CARE_PROVIDER_SITE_OTHER): Payer: Medicare Other | Admitting: *Deleted

## 2018-10-10 ENCOUNTER — Other Ambulatory Visit: Payer: Self-pay

## 2018-10-10 DIAGNOSIS — I480 Paroxysmal atrial fibrillation: Secondary | ICD-10-CM

## 2018-10-10 DIAGNOSIS — I82722 Chronic embolism and thrombosis of deep veins of left upper extremity: Secondary | ICD-10-CM | POA: Diagnosis not present

## 2018-10-10 DIAGNOSIS — Z7901 Long term (current) use of anticoagulants: Secondary | ICD-10-CM | POA: Diagnosis not present

## 2018-10-10 LAB — POCT INR: INR: 3.8 — AB (ref 2.0–3.0)

## 2018-10-10 NOTE — Patient Instructions (Signed)
Description   Spoke with pt and  Instructed pt to hold today's dose then continue with 1/2 tablet daily except 1 tablet each Monday and Friday.  Repeat INR in 4 weeks.

## 2018-11-01 ENCOUNTER — Telehealth: Payer: Self-pay

## 2018-11-01 NOTE — Telephone Encounter (Signed)
Unable to lmom for prescreen  

## 2018-11-09 ENCOUNTER — Ambulatory Visit (INDEPENDENT_AMBULATORY_CARE_PROVIDER_SITE_OTHER): Payer: Medicare Other | Admitting: *Deleted

## 2018-11-09 ENCOUNTER — Ambulatory Visit (INDEPENDENT_AMBULATORY_CARE_PROVIDER_SITE_OTHER): Payer: Medicare Other | Admitting: Pharmacist

## 2018-11-09 ENCOUNTER — Other Ambulatory Visit: Payer: Self-pay

## 2018-11-09 DIAGNOSIS — I495 Sick sinus syndrome: Secondary | ICD-10-CM | POA: Diagnosis not present

## 2018-11-09 DIAGNOSIS — I4891 Unspecified atrial fibrillation: Secondary | ICD-10-CM

## 2018-11-09 DIAGNOSIS — Z7901 Long term (current) use of anticoagulants: Secondary | ICD-10-CM

## 2018-11-09 DIAGNOSIS — I82722 Chronic embolism and thrombosis of deep veins of left upper extremity: Secondary | ICD-10-CM | POA: Diagnosis not present

## 2018-11-09 LAB — CUP PACEART REMOTE DEVICE CHECK
Battery Remaining Longevity: 24 mo
Battery Voltage: 2.93 V
Brady Statistic AP VP Percent: 0.72 %
Brady Statistic AP VS Percent: 95.43 %
Brady Statistic AS VP Percent: 0.02 %
Brady Statistic AS VS Percent: 3.83 %
Brady Statistic RA Percent Paced: 94.76 %
Brady Statistic RV Percent Paced: 0.87 %
Date Time Interrogation Session: 20200625132047
Implantable Lead Implant Date: 20140130
Implantable Lead Implant Date: 20140130
Implantable Lead Location: 753859
Implantable Lead Location: 753860
Implantable Pulse Generator Implant Date: 20140130
Lead Channel Impedance Value: 494 Ohm
Lead Channel Impedance Value: 551 Ohm
Lead Channel Impedance Value: 570 Ohm
Lead Channel Impedance Value: 589 Ohm
Lead Channel Pacing Threshold Amplitude: 0.625 V
Lead Channel Pacing Threshold Amplitude: 1.5 V
Lead Channel Pacing Threshold Pulse Width: 0.4 ms
Lead Channel Pacing Threshold Pulse Width: 0.4 ms
Lead Channel Sensing Intrinsic Amplitude: 4.25 mV
Lead Channel Sensing Intrinsic Amplitude: 7.25 mV
Lead Channel Setting Pacing Amplitude: 2.5 V
Lead Channel Setting Pacing Amplitude: 2.5 V
Lead Channel Setting Pacing Pulse Width: 0.4 ms
Lead Channel Setting Sensing Sensitivity: 0.9 mV

## 2018-11-09 LAB — POCT INR: INR: 2.8 (ref 2.0–3.0)

## 2018-11-17 ENCOUNTER — Encounter: Payer: Self-pay | Admitting: Cardiology

## 2018-11-17 NOTE — Progress Notes (Signed)
Remote pacemaker transmission.   

## 2018-12-15 ENCOUNTER — Telehealth: Payer: Self-pay

## 2018-12-15 NOTE — Telephone Encounter (Signed)

## 2018-12-18 ENCOUNTER — Ambulatory Visit (INDEPENDENT_AMBULATORY_CARE_PROVIDER_SITE_OTHER): Payer: Medicare Other | Admitting: Pharmacist

## 2018-12-18 ENCOUNTER — Other Ambulatory Visit: Payer: Self-pay

## 2018-12-18 DIAGNOSIS — Z7901 Long term (current) use of anticoagulants: Secondary | ICD-10-CM | POA: Diagnosis not present

## 2018-12-18 DIAGNOSIS — I4891 Unspecified atrial fibrillation: Secondary | ICD-10-CM | POA: Diagnosis not present

## 2018-12-18 DIAGNOSIS — I82722 Chronic embolism and thrombosis of deep veins of left upper extremity: Secondary | ICD-10-CM | POA: Diagnosis not present

## 2018-12-18 LAB — POCT INR: INR: 3.5 — AB (ref 2.0–3.0)

## 2019-01-29 ENCOUNTER — Other Ambulatory Visit: Payer: Self-pay

## 2019-01-29 ENCOUNTER — Ambulatory Visit (INDEPENDENT_AMBULATORY_CARE_PROVIDER_SITE_OTHER): Payer: Medicare Other | Admitting: Pharmacist Clinician (PhC)/ Clinical Pharmacy Specialist

## 2019-01-29 DIAGNOSIS — Z7901 Long term (current) use of anticoagulants: Secondary | ICD-10-CM

## 2019-01-29 DIAGNOSIS — I4891 Unspecified atrial fibrillation: Secondary | ICD-10-CM | POA: Diagnosis not present

## 2019-01-29 DIAGNOSIS — I82722 Chronic embolism and thrombosis of deep veins of left upper extremity: Secondary | ICD-10-CM

## 2019-01-29 LAB — POCT INR: INR: 2.3 (ref 2.0–3.0)

## 2019-02-05 ENCOUNTER — Other Ambulatory Visit: Payer: Self-pay | Admitting: Internal Medicine

## 2019-02-08 ENCOUNTER — Ambulatory Visit (INDEPENDENT_AMBULATORY_CARE_PROVIDER_SITE_OTHER): Payer: Medicare Other | Admitting: *Deleted

## 2019-02-08 DIAGNOSIS — I495 Sick sinus syndrome: Secondary | ICD-10-CM | POA: Diagnosis not present

## 2019-02-08 LAB — CUP PACEART REMOTE DEVICE CHECK
Battery Remaining Longevity: 22 mo
Battery Voltage: 2.93 V
Brady Statistic AP VP Percent: 0.73 %
Brady Statistic AP VS Percent: 95.24 %
Brady Statistic AS VP Percent: 0.03 %
Brady Statistic AS VS Percent: 4 %
Brady Statistic RA Percent Paced: 94.59 %
Brady Statistic RV Percent Paced: 0.9 %
Date Time Interrogation Session: 20200924124336
Implantable Lead Implant Date: 20140130
Implantable Lead Implant Date: 20140130
Implantable Lead Location: 753859
Implantable Lead Location: 753860
Implantable Pulse Generator Implant Date: 20140130
Lead Channel Impedance Value: 475 Ohm
Lead Channel Impedance Value: 532 Ohm
Lead Channel Impedance Value: 570 Ohm
Lead Channel Impedance Value: 608 Ohm
Lead Channel Pacing Threshold Amplitude: 0.625 V
Lead Channel Pacing Threshold Amplitude: 1.375 V
Lead Channel Pacing Threshold Pulse Width: 0.4 ms
Lead Channel Pacing Threshold Pulse Width: 0.4 ms
Lead Channel Sensing Intrinsic Amplitude: 3.875 mV
Lead Channel Sensing Intrinsic Amplitude: 3.875 mV
Lead Channel Sensing Intrinsic Amplitude: 5.875 mV
Lead Channel Sensing Intrinsic Amplitude: 5.875 mV
Lead Channel Setting Pacing Amplitude: 2.5 V
Lead Channel Setting Pacing Amplitude: 2.5 V
Lead Channel Setting Pacing Pulse Width: 0.4 ms
Lead Channel Setting Sensing Sensitivity: 0.9 mV

## 2019-02-15 ENCOUNTER — Encounter: Payer: Self-pay | Admitting: Cardiology

## 2019-02-15 NOTE — Progress Notes (Signed)
Remote pacemaker transmission.   

## 2019-03-12 ENCOUNTER — Other Ambulatory Visit: Payer: Self-pay

## 2019-03-12 ENCOUNTER — Ambulatory Visit (INDEPENDENT_AMBULATORY_CARE_PROVIDER_SITE_OTHER): Payer: Medicare Other | Admitting: Pharmacist

## 2019-03-12 DIAGNOSIS — I82722 Chronic embolism and thrombosis of deep veins of left upper extremity: Secondary | ICD-10-CM

## 2019-03-12 DIAGNOSIS — Z7901 Long term (current) use of anticoagulants: Secondary | ICD-10-CM

## 2019-03-12 LAB — POCT INR: INR: 3.1 — AB (ref 2.0–3.0)

## 2019-04-23 ENCOUNTER — Other Ambulatory Visit: Payer: Self-pay

## 2019-04-23 ENCOUNTER — Ambulatory Visit (INDEPENDENT_AMBULATORY_CARE_PROVIDER_SITE_OTHER): Payer: Medicare Other | Admitting: Pharmacist

## 2019-04-23 DIAGNOSIS — Z7901 Long term (current) use of anticoagulants: Secondary | ICD-10-CM

## 2019-04-23 DIAGNOSIS — I82722 Chronic embolism and thrombosis of deep veins of left upper extremity: Secondary | ICD-10-CM | POA: Diagnosis not present

## 2019-04-23 LAB — POCT INR: INR: 2.4 (ref 2.0–3.0)

## 2019-05-02 ENCOUNTER — Other Ambulatory Visit: Payer: Self-pay | Admitting: Internal Medicine

## 2019-05-10 ENCOUNTER — Ambulatory Visit (INDEPENDENT_AMBULATORY_CARE_PROVIDER_SITE_OTHER): Payer: Medicare Other | Admitting: *Deleted

## 2019-05-10 DIAGNOSIS — R001 Bradycardia, unspecified: Secondary | ICD-10-CM | POA: Diagnosis not present

## 2019-05-10 DIAGNOSIS — Z1212 Encounter for screening for malignant neoplasm of rectum: Secondary | ICD-10-CM | POA: Diagnosis not present

## 2019-05-11 LAB — CUP PACEART REMOTE DEVICE CHECK
Battery Remaining Longevity: 21 mo
Battery Voltage: 2.92 V
Brady Statistic AP VP Percent: 0.4 %
Brady Statistic AP VS Percent: 96.52 %
Brady Statistic AS VP Percent: 0.02 %
Brady Statistic AS VS Percent: 3.06 %
Brady Statistic RA Percent Paced: 95.62 %
Brady Statistic RV Percent Paced: 0.46 %
Date Time Interrogation Session: 20201224104004
Implantable Lead Implant Date: 20140130
Implantable Lead Implant Date: 20140130
Implantable Lead Location: 753859
Implantable Lead Location: 753860
Implantable Pulse Generator Implant Date: 20140130
Lead Channel Impedance Value: 456 Ohm
Lead Channel Impedance Value: 532 Ohm
Lead Channel Impedance Value: 570 Ohm
Lead Channel Impedance Value: 627 Ohm
Lead Channel Pacing Threshold Amplitude: 0.5 V
Lead Channel Pacing Threshold Amplitude: 1.375 V
Lead Channel Pacing Threshold Pulse Width: 0.4 ms
Lead Channel Pacing Threshold Pulse Width: 0.4 ms
Lead Channel Sensing Intrinsic Amplitude: 3.125 mV
Lead Channel Sensing Intrinsic Amplitude: 3.125 mV
Lead Channel Sensing Intrinsic Amplitude: 6.875 mV
Lead Channel Sensing Intrinsic Amplitude: 6.875 mV
Lead Channel Setting Pacing Amplitude: 2.5 V
Lead Channel Setting Pacing Amplitude: 2.5 V
Lead Channel Setting Pacing Pulse Width: 0.4 ms
Lead Channel Setting Sensing Sensitivity: 0.9 mV

## 2019-06-02 ENCOUNTER — Other Ambulatory Visit: Payer: Self-pay | Admitting: Internal Medicine

## 2019-06-04 ENCOUNTER — Other Ambulatory Visit: Payer: Self-pay

## 2019-06-04 ENCOUNTER — Ambulatory Visit (INDEPENDENT_AMBULATORY_CARE_PROVIDER_SITE_OTHER): Payer: Medicare Other | Admitting: Pharmacist

## 2019-06-04 DIAGNOSIS — Z7901 Long term (current) use of anticoagulants: Secondary | ICD-10-CM | POA: Diagnosis not present

## 2019-06-04 DIAGNOSIS — I82722 Chronic embolism and thrombosis of deep veins of left upper extremity: Secondary | ICD-10-CM | POA: Diagnosis not present

## 2019-06-04 DIAGNOSIS — I82729 Chronic embolism and thrombosis of deep veins of unspecified upper extremity: Secondary | ICD-10-CM | POA: Diagnosis not present

## 2019-06-04 DIAGNOSIS — I4891 Unspecified atrial fibrillation: Secondary | ICD-10-CM

## 2019-06-04 LAB — POCT INR: INR: 2.3 (ref 2.0–3.0)

## 2019-06-25 ENCOUNTER — Ambulatory Visit: Payer: Medicare Other

## 2019-07-02 ENCOUNTER — Encounter: Payer: Self-pay | Admitting: Internal Medicine

## 2019-07-02 ENCOUNTER — Other Ambulatory Visit: Payer: Self-pay

## 2019-07-02 ENCOUNTER — Ambulatory Visit (INDEPENDENT_AMBULATORY_CARE_PROVIDER_SITE_OTHER): Payer: Medicare Other | Admitting: Internal Medicine

## 2019-07-02 VITALS — BP 108/78 | HR 64 | Ht 70.0 in | Wt 192.4 lb

## 2019-07-02 DIAGNOSIS — I480 Paroxysmal atrial fibrillation: Secondary | ICD-10-CM

## 2019-07-02 DIAGNOSIS — I495 Sick sinus syndrome: Secondary | ICD-10-CM | POA: Diagnosis not present

## 2019-07-02 DIAGNOSIS — Z950 Presence of cardiac pacemaker: Secondary | ICD-10-CM | POA: Diagnosis not present

## 2019-07-02 DIAGNOSIS — I10 Essential (primary) hypertension: Secondary | ICD-10-CM | POA: Diagnosis not present

## 2019-07-02 NOTE — Progress Notes (Signed)
HPI Richard [name] return stoday for followup. He is a pleasant 79 yo man with symptomatic sinus node dysfunction, s/p PPM, HTN, and dyslipidemia. He swims and notes that his left arm still swells while he is swimming but then goes back to normal. He does not feel his atrial fib. No edema. No chest pain or sob.He does not palpitations.  No Known Allergies   Current Outpatient Medications  Medication Sig Dispense Refill  . atenolol (TENORMIN) 25 MG tablet TAKE 1 TABLET BY MOUTH DAILY 90 tablet 0  . Calcium Citrate-Vitamin D (CALCIUM CITRATE + PO) Take 1 tablet by mouth daily.    . Multiple Vitamin (MULTIVITAMIN WITH MINERALS) TABS Take 1 tablet by mouth daily.    . Omega-3 Fatty Acids (FISH OIL) 300 MG CAPS Take 1 capsule by mouth daily.    . simvastatin (ZOCOR) 40 MG tablet Take 1 tablet (40 mg total) by mouth daily. 90 tablet 2  . warfarin (COUMADIN) 2.5 MG tablet TAKE 1/2 TO 1 TABLET BY MOUTH DAILY OR AS DIRECTED 90 tablet 1   No current facility-administered medications for this visit.     Past Medical History:  Diagnosis Date  . 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 Chronic anticoagulation, on coumadin for PAF 06/16/2012  . Clotting disorder (HCC)    hx of DVT  . Dysrhythmia   . H/O cardiovascular stress test    normal, done as a baseline , ? where it was done, 4-5 yrs. ago  . Hyperlipidemia 06/16/2012  . Hypertension   . Pacemaker    Medtronic; implanted 06/15/12  . PAF (paroxysmal atrial fibrillation) (HCC)     ROS:   All systems reviewed and negative except as noted in the HPI.   Past Surgical History:  Procedure Laterality Date  . COLONOSCOPY    . INSERT / REPLACE / REMOVE PACEMAKER  06/15/2012   dual chamber  . KNEE ARTHROPLASTY  04/19/2012   Procedure: COMPUTER ASSISTED TOTAL KNEE ARTHROPLASTY;  Surgeon: Alta Corning, MD;  Location: [facility];  Service: Orthopedics;  Laterality: Right;  GENERAL WITH PRE  OP FEMORAL NERVE BLOCK  . PERMANENT PACEMAKER INSERTION N/A 06/15/2012   Procedure: PERMANENT PACEMAKER INSERTION;  Surgeon: Sanda Klein, MD;  Location: [facility];  Service: Cardiovascular;  Laterality: N/A;  . TONSILLECTOMY    . TOTAL HIP ARTHROPLASTY Right 05/21/2016   Procedure: TOTAL HIP ARTHROPLASTY ANTERIOR APPROACH;  Surgeon: Dorna Leitz, MD;  Location: [facility];  Service: Orthopedics;  Laterality: Right;  . TOTAL KNEE ARTHROPLASTY  04/19/2012   RIGHT KNEE     Family History  Problem Relation Age of Onset  . Heart disease Father   . Colon cancer Neg Hx   . Esophageal cancer Neg Hx   . Rectal cancer Neg Hx   . Stomach cancer Neg Hx      Social History   Socioeconomic History  . Marital status: Married    Spouse name: Not on file  . Number of children: 2  . Years of education: Not on file  . Highest education level: Not on file  Occupational History  . Occupation: retired  Tobacco Use  . Smoking status: Never Smoker  . Smokeless tobacco: Never Used  Substance and Sexual Activity  . Alcohol use: Yes    Comment: week- 1-2 drinks, 1-2 beers   . Drug use: No  . Sexual activity: Not  on file  Other Topics Concern  . Not on file  Social History Narrative  . Not on file   Social Determinants of Health   Financial Resource Strain:   . Difficulty of Paying Living Expenses: Not on file  Food Insecurity:   . Worried About Charity fundraiser in the Last Year: Not on file  . Ran Out of Food in the Last Year: Not on file  Transportation Needs:   . Lack of Transportation (Medical): Not on file  . Lack of Transportation (Non-Medical): Not on file  Physical Activity:   . Days of Exercise per Week: Not on file  . Minutes of Exercise per Session: Not on file  Stress:   . Feeling of Stress : Not on file  Social Connections:   . Frequency of Communication with Friends and Family: Not on file  . Frequency of Social Gatherings with Friends and Family: Not on file  .  Attends Religious Services: Not on file  . Active Member of Clubs or Organizations: Not on file  . Attends Archivist Meetings: Not on file  . Marital Status: Not on file  Intimate Partner Violence:   . Fear of Current or Ex-Partner: Not on file  . Emotionally Abused: Not on file  . Physically Abused: Not on file  . Sexually Abused: Not on file     BP 108/78   Pulse 64   Ht 5\' 10"  (1.778 m)   Wt 192 lb 6.4 oz (87.3 kg)   SpO2 98%   BMI 27.61 kg/m   Physical Exam:  Well appearing NAD HEENT: Unremarkable Neck:  No JVD, no thyromegally Lymphatics:  No adenopathy Back:  No CVA tenderness Lungs:  Clear with no wheezes HEART:  Regular rate rhythm, no murmurs, no rubs, no clicks Abd:  soft, positive bowel sounds, no organomegally, no rebound, no guarding Ext:  2 plus pulses, no edema, no cyanosis, no clubbing Skin:  No rashes no nodules Neuro:  CN II through XII intact, motor grossly intact  EKG - nsr with atrial pacing and RBBB  DEVICE  Normal device function.  See PaceArt for details.   Assess/Plan: 1. SVT - he has minimal symptoms. He will continue his current meds. 2. PAF - he is in rhythm 99.9% of the time.  3. Sinus node dysfunction - he is asymptomatic, s/p PPM insertion and is pacing about 96% of the time 4. HTN - his bp is well controlled. He will continue his current meds.  Richard [name].D.

## 2019-07-02 NOTE — Patient Instructions (Signed)
Medication Instructions:  Your physician recommends that you continue on your current medications as directed. Please refer to the Current Medication list given to you today.  Labwork: None ordered.  Testing/Procedures: None ordered.  Follow-Up: Your physician wants you to follow-up in: one year with Dr. Lovena Le.   You will receive a reminder letter in the mail two months in advance. If you don't receive a letter, please call our office to schedule the follow-up appointment.  Remote monitoring is used to monitor your Pacemaker from home. This monitoring reduces the number of office visits required to check your device to one time per year. It allows [place] to keep an eye on the functioning of your device to ensure it is working properly. You are scheduled for a device check from home on 08/09/2019. You may send your transmission at any time that day. If you have a wireless device, the transmission will be sent automatically. After your physician reviews your transmission, you will receive a postcard with your next transmission date.  Any Other Special Instructions Will Be Listed Below (If Applicable).  If you need a refill on your cardiac medications before your next appointment, please call your pharmacy.

## 2019-07-16 ENCOUNTER — Ambulatory Visit (INDEPENDENT_AMBULATORY_CARE_PROVIDER_SITE_OTHER): Payer: Medicare Other | Admitting: Pharmacist

## 2019-07-16 ENCOUNTER — Other Ambulatory Visit: Payer: Self-pay

## 2019-07-16 DIAGNOSIS — I82729 Chronic embolism and thrombosis of deep veins of unspecified upper extremity: Secondary | ICD-10-CM | POA: Diagnosis not present

## 2019-07-16 DIAGNOSIS — I4891 Unspecified atrial fibrillation: Secondary | ICD-10-CM

## 2019-07-16 DIAGNOSIS — Z7901 Long term (current) use of anticoagulants: Secondary | ICD-10-CM | POA: Diagnosis not present

## 2019-07-16 DIAGNOSIS — I82722 Chronic embolism and thrombosis of deep veins of left upper extremity: Secondary | ICD-10-CM

## 2019-07-16 LAB — CUP PACEART INCLINIC DEVICE CHECK
Battery Remaining Longevity: 21 mo
Battery Voltage: 2.92 V
Brady Statistic AP VP Percent: 0.73 %
Brady Statistic AP VS Percent: 95.41 %
Brady Statistic AS VP Percent: 0.06 %
Brady Statistic AS VS Percent: 3.81 %
Brady Statistic RA Percent Paced: 94.63 %
Brady Statistic RV Percent Paced: 0.9 %
Date Time Interrogation Session: 20210215153900
Implantable Lead Implant Date: 20140130
Implantable Lead Implant Date: 20140130
Implantable Lead Location: 753859
Implantable Lead Location: 753860
Implantable Pulse Generator Implant Date: 20140130
Lead Channel Impedance Value: 532 Ohm
Lead Channel Impedance Value: 608 Ohm
Lead Channel Impedance Value: 608 Ohm
Lead Channel Impedance Value: 646 Ohm
Lead Channel Pacing Threshold Amplitude: 0.625 V
Lead Channel Pacing Threshold Amplitude: 1.5 V
Lead Channel Pacing Threshold Pulse Width: 0.4 ms
Lead Channel Pacing Threshold Pulse Width: 0.4 ms
Lead Channel Sensing Intrinsic Amplitude: 4 mV
Lead Channel Sensing Intrinsic Amplitude: 6.3 mV
Lead Channel Setting Pacing Amplitude: 2.5 V
Lead Channel Setting Pacing Amplitude: 2.5 V
Lead Channel Setting Pacing Pulse Width: 0.4 ms
Lead Channel Setting Sensing Sensitivity: 0.9 mV

## 2019-07-16 LAB — POCT INR: INR: 3.5 — AB (ref 2.0–3.0)

## 2019-07-30 ENCOUNTER — Other Ambulatory Visit: Payer: Self-pay | Admitting: Internal Medicine

## 2019-08-01 DIAGNOSIS — H26493 Other secondary cataract, bilateral: Secondary | ICD-10-CM | POA: Diagnosis not present

## 2019-08-01 DIAGNOSIS — H35371 Puckering of macula, right eye: Secondary | ICD-10-CM | POA: Diagnosis not present

## 2019-08-01 DIAGNOSIS — H524 Presbyopia: Secondary | ICD-10-CM | POA: Diagnosis not present

## 2019-08-09 ENCOUNTER — Ambulatory Visit (INDEPENDENT_AMBULATORY_CARE_PROVIDER_SITE_OTHER): Payer: Medicare Other | Admitting: *Deleted

## 2019-08-09 DIAGNOSIS — R001 Bradycardia, unspecified: Secondary | ICD-10-CM

## 2019-08-09 LAB — CUP PACEART REMOTE DEVICE CHECK
Battery Remaining Longevity: 19 mo
Battery Voltage: 2.91 V
Brady Statistic AP VP Percent: 0.17 %
Brady Statistic AP VS Percent: 96.49 %
Brady Statistic AS VP Percent: 0.08 %
Brady Statistic AS VS Percent: 3.26 %
Brady Statistic RA Percent Paced: 95.92 %
Brady Statistic RV Percent Paced: 0.26 %
Date Time Interrogation Session: 20210325172211
Implantable Lead Implant Date: 20140130
Implantable Lead Implant Date: 20140130
Implantable Lead Location: 753859
Implantable Lead Location: 753860
Implantable Pulse Generator Implant Date: 20140130
Lead Channel Impedance Value: 494 Ohm
Lead Channel Impedance Value: 570 Ohm
Lead Channel Impedance Value: 589 Ohm
Lead Channel Impedance Value: 627 Ohm
Lead Channel Pacing Threshold Amplitude: 0.625 V
Lead Channel Pacing Threshold Amplitude: 1.5 V
Lead Channel Pacing Threshold Pulse Width: 0.4 ms
Lead Channel Pacing Threshold Pulse Width: 0.4 ms
Lead Channel Sensing Intrinsic Amplitude: 2.5 mV
Lead Channel Sensing Intrinsic Amplitude: 2.5 mV
Lead Channel Sensing Intrinsic Amplitude: 6 mV
Lead Channel Sensing Intrinsic Amplitude: 6 mV
Lead Channel Setting Pacing Amplitude: 2.5 V
Lead Channel Setting Pacing Amplitude: 2.5 V
Lead Channel Setting Pacing Pulse Width: 0.4 ms
Lead Channel Setting Sensing Sensitivity: 0.9 mV

## 2019-08-10 NOTE — Progress Notes (Signed)
PPM Remote  

## 2019-08-27 ENCOUNTER — Other Ambulatory Visit: Payer: Self-pay

## 2019-08-27 ENCOUNTER — Ambulatory Visit (INDEPENDENT_AMBULATORY_CARE_PROVIDER_SITE_OTHER): Payer: Medicare Other | Admitting: Pharmacist

## 2019-08-27 DIAGNOSIS — I4891 Unspecified atrial fibrillation: Secondary | ICD-10-CM

## 2019-08-27 DIAGNOSIS — I82729 Chronic embolism and thrombosis of deep veins of unspecified upper extremity: Secondary | ICD-10-CM | POA: Diagnosis not present

## 2019-08-27 DIAGNOSIS — M1712 Unilateral primary osteoarthritis, left knee: Secondary | ICD-10-CM | POA: Diagnosis not present

## 2019-08-27 DIAGNOSIS — Z7901 Long term (current) use of anticoagulants: Secondary | ICD-10-CM | POA: Diagnosis not present

## 2019-08-27 DIAGNOSIS — I82722 Chronic embolism and thrombosis of deep veins of left upper extremity: Secondary | ICD-10-CM

## 2019-08-27 DIAGNOSIS — M25562 Pain in left knee: Secondary | ICD-10-CM | POA: Diagnosis not present

## 2019-08-27 LAB — POCT INR: INR: 2.9 (ref 2.0–3.0)

## 2019-09-10 DIAGNOSIS — M25552 Pain in left hip: Secondary | ICD-10-CM | POA: Diagnosis not present

## 2019-09-10 DIAGNOSIS — M545 Low back pain: Secondary | ICD-10-CM | POA: Diagnosis not present

## 2019-09-10 DIAGNOSIS — M5442 Lumbago with sciatica, left side: Secondary | ICD-10-CM | POA: Diagnosis not present

## 2019-09-10 DIAGNOSIS — M25562 Pain in left knee: Secondary | ICD-10-CM | POA: Diagnosis not present

## 2019-09-18 DIAGNOSIS — M25562 Pain in left knee: Secondary | ICD-10-CM | POA: Diagnosis not present

## 2019-09-20 ENCOUNTER — Other Ambulatory Visit (HOSPITAL_COMMUNITY): Payer: Self-pay | Admitting: Orthopedic Surgery

## 2019-09-20 DIAGNOSIS — M25562 Pain in left knee: Secondary | ICD-10-CM

## 2019-10-04 ENCOUNTER — Ambulatory Visit (HOSPITAL_COMMUNITY)
Admission: RE | Admit: 2019-10-04 | Discharge: 2019-10-04 | Disposition: A | Discharge status: Home / Self Care | Payer: Medicare Other | Source: Ambulatory Visit | Attending: Orthopedic Surgery | Admitting: Orthopedic Surgery

## 2019-10-04 ENCOUNTER — Other Ambulatory Visit: Payer: Self-pay

## 2019-10-04 DIAGNOSIS — M25562 Pain in left knee: Secondary | ICD-10-CM | POA: Insufficient documentation

## 2019-10-04 DIAGNOSIS — M25462 Effusion, left knee: Secondary | ICD-10-CM | POA: Diagnosis not present

## 2019-10-08 ENCOUNTER — Other Ambulatory Visit: Payer: Self-pay

## 2019-10-08 ENCOUNTER — Ambulatory Visit (INDEPENDENT_AMBULATORY_CARE_PROVIDER_SITE_OTHER): Payer: Medicare Other | Admitting: Pharmacist

## 2019-10-08 ENCOUNTER — Telehealth: Payer: Self-pay | Admitting: Internal Medicine

## 2019-10-08 DIAGNOSIS — I4891 Unspecified atrial fibrillation: Secondary | ICD-10-CM

## 2019-10-08 DIAGNOSIS — I82722 Chronic embolism and thrombosis of deep veins of left upper extremity: Secondary | ICD-10-CM | POA: Diagnosis not present

## 2019-10-08 DIAGNOSIS — S83242A Other tear of medial meniscus, current injury, left knee, initial encounter: Secondary | ICD-10-CM | POA: Diagnosis not present

## 2019-10-08 DIAGNOSIS — M25562 Pain in left knee: Secondary | ICD-10-CM | POA: Diagnosis not present

## 2019-10-08 DIAGNOSIS — Z7901 Long term (current) use of anticoagulants: Secondary | ICD-10-CM | POA: Diagnosis not present

## 2019-10-08 LAB — POCT INR: INR: 2.8 (ref 2.0–3.0)

## 2019-10-08 NOTE — Telephone Encounter (Signed)
   [facility] Pre-operative Risk Assessment    HEARTCARE STAFF: - Please ensure there is not already an duplicate clearance open for this procedure. - Under Visit Info/Reason for Call, type in Other and utilize the format Clearance MM/DD/YY or Clearance TBD. Do not use dashes or single digits.  Request for surgical clearance:  1. What type of surgery is being performed? Left knee scope  2. When is this surgery scheduled? 10/10/19  3. What type of clearance is required (medical clearance vs. Pharmacy clearance to hold med vs. Both)? both  4. Are there any medications that need to be held prior to surgery and how long? warfarin  5. Practice name and name of physician performing surgery? Dr. Dorna Leitz  6. What is the office phone number? [phone]   7.   What is the office fax number? [phone]  8.   Anesthesia type (None, local, MAC, general) ? Choice   Leah Newnam 10/08/2019, 9:33 AM  _________________________________________________________________   (provider comments below)

## 2019-10-09 NOTE — Telephone Encounter (Signed)
   Primary Cardiologist: Cristopher Peru, MD  Chart reviewed as part of pre-operative protocol coverage. Given past medical history and time since last visit, based on ACC/AHA guidelines, KALED AI would be at acceptable risk for the planned procedure without further cardiovascular testing.   Pt will need to hold warfarin for 5 days prior to left knee scope. Looks like pt had INR checked yesterday 10/08/19 which was therapeutic at 2.8, no mention of any upcoming procedure in note.  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  Ok to hold warfarin for up to 5 days prior to procedure, however procedure date will need to be moved as request was entered in Epic 2 days before procedure.  I will route this recommendation to the requesting party via Epic fax function and remove from pre-op pool.  Please call with questions.  Jossie Ng. Georgi Navarrete NP-C    10/09/2019, 11:59 AM [address] Office [phone] Fax [phone]

## 2019-10-09 NOTE — Telephone Encounter (Signed)
Pt will need to hold warfarin for 5 days prior to left knee scope. Looks like pt had INR checked yesterday which was therapeutic at 2.8, no mention of any upcoming procedure in note.  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  Ok to hold warfarin for up to 5 days prior to procedure, however procedure date will need to be moved as request was entered in Epic 2 days before procedure.

## 2019-10-09 NOTE — Telephone Encounter (Signed)
Bethena Roys with [facility] is calling to follow up in regards to surgical clearance. She states the procedure is scheduled for 10/10/19. Please return call to [place] at [phone].

## 2019-10-10 DIAGNOSIS — S83232A Complex tear of medial meniscus, current injury, left knee, initial encounter: Secondary | ICD-10-CM | POA: Diagnosis not present

## 2019-10-10 DIAGNOSIS — Y999 Unspecified external cause status: Secondary | ICD-10-CM | POA: Diagnosis not present

## 2019-10-10 DIAGNOSIS — M2342 Loose body in knee, left knee: Secondary | ICD-10-CM | POA: Diagnosis not present

## 2019-10-10 DIAGNOSIS — X58XXXA Exposure to other specified factors, initial encounter: Secondary | ICD-10-CM | POA: Diagnosis not present

## 2019-10-10 DIAGNOSIS — M94262 Chondromalacia, left knee: Secondary | ICD-10-CM | POA: Diagnosis not present

## 2019-10-18 DIAGNOSIS — M1712 Unilateral primary osteoarthritis, left knee: Secondary | ICD-10-CM | POA: Diagnosis not present

## 2019-10-19 DIAGNOSIS — M1712 Unilateral primary osteoarthritis, left knee: Secondary | ICD-10-CM | POA: Diagnosis not present

## 2019-10-23 DIAGNOSIS — M1712 Unilateral primary osteoarthritis, left knee: Secondary | ICD-10-CM | POA: Diagnosis not present

## 2019-10-25 DIAGNOSIS — M1712 Unilateral primary osteoarthritis, left knee: Secondary | ICD-10-CM | POA: Diagnosis not present

## 2019-10-30 ENCOUNTER — Telehealth: Payer: Self-pay

## 2019-10-30 NOTE — Telephone Encounter (Signed)
[facility] was calling in regards to the clearance for this patient. The patient spoke with Dr. Berenice Primas and following his procedure in 2018 the patient went into Afib. [place] just wanted to confirm this and see if that would affect his surgical clearance. Please call to discuss with [facility]

## 2019-10-30 NOTE — Telephone Encounter (Addendum)
Patient with diagnosis of Afib on warfarin for anticoagulation.    Procedure: Left total knee arthroplasty  Date of procedure: 11/14/19  CHADS2-VASc score of 3 (HTN, AGE x2). Of note he does have a hx of left subclavian vein stenosis ~1 month after pacemaker insertion in 2014 when he changed from warfarin to Xarelto  CrCl 68 mL/min Platelet count 157  Per office protocol, patient can hold warfarin for 5 days prior to procedure.    Patient will not need bridging with Lovenox (enoxaparin) around procedure.  Kennon Holter, PharmD PGY1 Ambulatory Care Pharmacy Resident

## 2019-10-30 NOTE — Telephone Encounter (Signed)
   Primary Cardiologist: Cristopher Peru, MD  Chart reviewed as part of pre-operative protocol coverage. Given past medical history and time since last visit, based on ACC/AHA guidelines, Richard [name] would be at acceptable risk for the planned procedure without further cardiovascular testing.   Patient with diagnosis of Afib on warfarin for anticoagulation.    Procedure: Left total knee arthroplasty Date of procedure: 11/14/19  CHADS2-VASc score of 3 (HTN, AGE x2). Of note he does have a hx of left subclavian vein stenosis ~1 month after pacemaker insertion in 2014 when he changed from warfarin to Xarelto  CrCl 68 mL/min Platelet count 157  Per office protocol, patient can hold warfarin for 5 days prior to procedure.    Patient will not need bridging with Lovenox (enoxaparin) around procedure  I will route this recommendation to the requesting party via [place] fax function and remove from pre-op pool.  Please call with questions.  Jossie Ng. Granvil Djordjevic NP-C    10/30/2019, 12:13 PM [address] Office [phone] Fax [phone]

## 2019-10-30 NOTE — Telephone Encounter (Signed)
   [facility] Pre-operative Risk Assessment    HEARTCARE STAFF: - Please ensure there is not already an duplicate clearance open for this procedure. - Under Visit Info/Reason for Call, type in Other and utilize the format Clearance MM/DD/YY or Clearance TBD. Do not use dashes or single digits. - If request is for dental extraction, please clarify the # of teeth to be extracted.  Request for surgical clearance:  1. What type of surgery is being performed? Left total knee arthroplasty   2. When is this surgery scheduled? 11/14/19   3. What type of clearance is required (medical clearance vs. Pharmacy clearance to hold med vs. Both)? Pharmacy  4. Are there any medications that need to be held prior to surgery and how long? warfarin   5. Practice name and name of physician performing surgery? [facility]/John Brock Bad, MD   6. What is the office phone number? [phone]   7.   What is the office fax number? [phone]  8.   Anesthesia type (None, local, MAC, general) ? spinal   Mady Haagensen 10/30/2019, 10:28 AM  _________________________________________________________________   (provider comments below)

## 2019-10-30 NOTE — Telephone Encounter (Signed)
Brittain is returning Jesse's call.

## 2019-11-08 ENCOUNTER — Ambulatory Visit (INDEPENDENT_AMBULATORY_CARE_PROVIDER_SITE_OTHER): Payer: Medicare Other | Admitting: *Deleted

## 2019-11-08 DIAGNOSIS — I495 Sick sinus syndrome: Secondary | ICD-10-CM

## 2019-11-08 LAB — CUP PACEART REMOTE DEVICE CHECK
Battery Remaining Longevity: 16 mo
Battery Voltage: 2.9 V
Brady Statistic AP VP Percent: 0.36 %
Brady Statistic AP VS Percent: 93.54 %
Brady Statistic AS VP Percent: 0.23 %
Brady Statistic AS VS Percent: 5.87 %
Brady Statistic RA Percent Paced: 91.9 %
Brady Statistic RV Percent Paced: 0.61 %
Date Time Interrogation Session: 20210624093642
Implantable Lead Implant Date: 20140130
Implantable Lead Implant Date: 20140130
Implantable Lead Location: 753859
Implantable Lead Location: 753860
Implantable Pulse Generator Implant Date: 20140130
Lead Channel Impedance Value: 494 Ohm
Lead Channel Impedance Value: 570 Ohm
Lead Channel Impedance Value: 608 Ohm
Lead Channel Impedance Value: 646 Ohm
Lead Channel Pacing Threshold Amplitude: 0.625 V
Lead Channel Pacing Threshold Amplitude: 1.5 V
Lead Channel Pacing Threshold Pulse Width: 0.4 ms
Lead Channel Pacing Threshold Pulse Width: 0.4 ms
Lead Channel Sensing Intrinsic Amplitude: 3.5 mV
Lead Channel Sensing Intrinsic Amplitude: 3.5 mV
Lead Channel Sensing Intrinsic Amplitude: 5.75 mV
Lead Channel Sensing Intrinsic Amplitude: 5.75 mV
Lead Channel Setting Pacing Amplitude: 2.5 V
Lead Channel Setting Pacing Amplitude: 2.5 V
Lead Channel Setting Pacing Pulse Width: 0.4 ms
Lead Channel Setting Sensing Sensitivity: 0.9 mV

## 2019-11-09 NOTE — Progress Notes (Signed)
Remote pacemaker transmission.   

## 2019-11-14 DIAGNOSIS — Z96652 Presence of left artificial knee joint: Secondary | ICD-10-CM | POA: Diagnosis not present

## 2019-11-14 DIAGNOSIS — M1712 Unilateral primary osteoarthritis, left knee: Secondary | ICD-10-CM | POA: Diagnosis not present

## 2019-11-16 DIAGNOSIS — Z471 Aftercare following joint replacement surgery: Secondary | ICD-10-CM | POA: Diagnosis not present

## 2019-11-16 DIAGNOSIS — Z7901 Long term (current) use of anticoagulants: Secondary | ICD-10-CM | POA: Diagnosis not present

## 2019-11-16 DIAGNOSIS — Z96652 Presence of left artificial knee joint: Secondary | ICD-10-CM | POA: Diagnosis not present

## 2019-11-20 DIAGNOSIS — Z7901 Long term (current) use of anticoagulants: Secondary | ICD-10-CM | POA: Diagnosis not present

## 2019-11-20 DIAGNOSIS — Z471 Aftercare following joint replacement surgery: Secondary | ICD-10-CM | POA: Diagnosis not present

## 2019-11-20 DIAGNOSIS — Z96652 Presence of left artificial knee joint: Secondary | ICD-10-CM | POA: Diagnosis not present

## 2019-11-22 DIAGNOSIS — Z96652 Presence of left artificial knee joint: Secondary | ICD-10-CM | POA: Diagnosis not present

## 2019-11-22 DIAGNOSIS — Z471 Aftercare following joint replacement surgery: Secondary | ICD-10-CM | POA: Diagnosis not present

## 2019-11-22 DIAGNOSIS — Z7901 Long term (current) use of anticoagulants: Secondary | ICD-10-CM | POA: Diagnosis not present

## 2019-11-26 DIAGNOSIS — Z471 Aftercare following joint replacement surgery: Secondary | ICD-10-CM | POA: Diagnosis not present

## 2019-11-26 DIAGNOSIS — Z7901 Long term (current) use of anticoagulants: Secondary | ICD-10-CM | POA: Diagnosis not present

## 2019-11-26 DIAGNOSIS — Z96652 Presence of left artificial knee joint: Secondary | ICD-10-CM | POA: Diagnosis not present

## 2019-11-29 DIAGNOSIS — Z7901 Long term (current) use of anticoagulants: Secondary | ICD-10-CM | POA: Diagnosis not present

## 2019-11-29 DIAGNOSIS — Z96652 Presence of left artificial knee joint: Secondary | ICD-10-CM | POA: Diagnosis not present

## 2019-11-29 DIAGNOSIS — Z471 Aftercare following joint replacement surgery: Secondary | ICD-10-CM | POA: Diagnosis not present

## 2019-12-03 DIAGNOSIS — M1712 Unilateral primary osteoarthritis, left knee: Secondary | ICD-10-CM | POA: Diagnosis not present

## 2019-12-03 DIAGNOSIS — Z96652 Presence of left artificial knee joint: Secondary | ICD-10-CM | POA: Diagnosis not present

## 2019-12-03 DIAGNOSIS — M25562 Pain in left knee: Secondary | ICD-10-CM | POA: Diagnosis not present

## 2019-12-07 ENCOUNTER — Other Ambulatory Visit: Payer: Self-pay

## 2019-12-07 ENCOUNTER — Ambulatory Visit (INDEPENDENT_AMBULATORY_CARE_PROVIDER_SITE_OTHER): Payer: Medicare Other | Admitting: Pharmacist

## 2019-12-07 DIAGNOSIS — Z96652 Presence of left artificial knee joint: Secondary | ICD-10-CM | POA: Diagnosis not present

## 2019-12-07 DIAGNOSIS — Z7901 Long term (current) use of anticoagulants: Secondary | ICD-10-CM | POA: Diagnosis not present

## 2019-12-07 DIAGNOSIS — I82722 Chronic embolism and thrombosis of deep veins of left upper extremity: Secondary | ICD-10-CM

## 2019-12-07 LAB — POCT INR: INR: 3 (ref 2.0–3.0)

## 2019-12-10 DIAGNOSIS — Z96652 Presence of left artificial knee joint: Secondary | ICD-10-CM | POA: Diagnosis not present

## 2019-12-12 DIAGNOSIS — Z96652 Presence of left artificial knee joint: Secondary | ICD-10-CM | POA: Diagnosis not present

## 2019-12-14 DIAGNOSIS — Z96652 Presence of left artificial knee joint: Secondary | ICD-10-CM | POA: Diagnosis not present

## 2019-12-18 DIAGNOSIS — Z96652 Presence of left artificial knee joint: Secondary | ICD-10-CM | POA: Diagnosis not present

## 2019-12-20 DIAGNOSIS — Z96652 Presence of left artificial knee joint: Secondary | ICD-10-CM | POA: Diagnosis not present

## 2019-12-25 DIAGNOSIS — Z96652 Presence of left artificial knee joint: Secondary | ICD-10-CM | POA: Diagnosis not present

## 2019-12-27 DIAGNOSIS — Z96652 Presence of left artificial knee joint: Secondary | ICD-10-CM | POA: Diagnosis not present

## 2020-01-01 DIAGNOSIS — Z96652 Presence of left artificial knee joint: Secondary | ICD-10-CM | POA: Diagnosis not present

## 2020-01-03 DIAGNOSIS — Z96652 Presence of left artificial knee joint: Secondary | ICD-10-CM | POA: Diagnosis not present

## 2020-01-08 DIAGNOSIS — Z96652 Presence of left artificial knee joint: Secondary | ICD-10-CM | POA: Diagnosis not present

## 2020-01-10 DIAGNOSIS — Z96652 Presence of left artificial knee joint: Secondary | ICD-10-CM | POA: Diagnosis not present

## 2020-01-15 DIAGNOSIS — Z96652 Presence of left artificial knee joint: Secondary | ICD-10-CM | POA: Diagnosis not present

## 2020-01-17 DIAGNOSIS — Z96652 Presence of left artificial knee joint: Secondary | ICD-10-CM | POA: Diagnosis not present

## 2020-01-18 ENCOUNTER — Other Ambulatory Visit: Payer: Self-pay

## 2020-01-18 ENCOUNTER — Other Ambulatory Visit: Payer: Self-pay | Admitting: Internal Medicine

## 2020-01-18 ENCOUNTER — Ambulatory Visit (INDEPENDENT_AMBULATORY_CARE_PROVIDER_SITE_OTHER): Payer: Medicare Other

## 2020-01-18 DIAGNOSIS — Z7901 Long term (current) use of anticoagulants: Secondary | ICD-10-CM

## 2020-01-18 DIAGNOSIS — I82722 Chronic embolism and thrombosis of deep veins of left upper extremity: Secondary | ICD-10-CM

## 2020-01-18 LAB — POCT INR: INR: 2.8 (ref 2.0–3.0)

## 2020-01-18 NOTE — Patient Instructions (Signed)
Continue with 1/2 tablet daily except 1 tablet each Monday and Friday.  Repeat INR in 6 weeks.

## 2020-01-31 DIAGNOSIS — Z471 Aftercare following joint replacement surgery: Secondary | ICD-10-CM | POA: Diagnosis not present

## 2020-01-31 DIAGNOSIS — Z96652 Presence of left artificial knee joint: Secondary | ICD-10-CM | POA: Diagnosis not present

## 2020-02-07 ENCOUNTER — Ambulatory Visit (INDEPENDENT_AMBULATORY_CARE_PROVIDER_SITE_OTHER): Payer: Medicare Other | Admitting: Emergency Medicine

## 2020-02-07 DIAGNOSIS — R001 Bradycardia, unspecified: Secondary | ICD-10-CM

## 2020-02-07 DIAGNOSIS — I4891 Unspecified atrial fibrillation: Secondary | ICD-10-CM

## 2020-02-11 LAB — CUP PACEART REMOTE DEVICE CHECK
Battery Remaining Longevity: 15 mo
Battery Voltage: 2.89 V
Brady Statistic AP VP Percent: 1.57 %
Brady Statistic AP VS Percent: 94.65 %
Brady Statistic AS VP Percent: 0.06 %
Brady Statistic AS VS Percent: 3.72 %
Brady Statistic RA Percent Paced: 94.23 %
Brady Statistic RV Percent Paced: 1.6 %
Date Time Interrogation Session: 20210926140134
Implantable Lead Implant Date: 20140130
Implantable Lead Implant Date: 20140130
Implantable Lead Location: 753859
Implantable Lead Location: 753860
Implantable Pulse Generator Implant Date: 20140130
Lead Channel Impedance Value: 456 Ohm
Lead Channel Impedance Value: 532 Ohm
Lead Channel Impedance Value: 589 Ohm
Lead Channel Impedance Value: 646 Ohm
Lead Channel Pacing Threshold Amplitude: 0.75 V
Lead Channel Pacing Threshold Amplitude: 1.625 V
Lead Channel Pacing Threshold Pulse Width: 0.4 ms
Lead Channel Pacing Threshold Pulse Width: 0.4 ms
Lead Channel Sensing Intrinsic Amplitude: 3.75 mV
Lead Channel Sensing Intrinsic Amplitude: 3.75 mV
Lead Channel Sensing Intrinsic Amplitude: 6 mV
Lead Channel Sensing Intrinsic Amplitude: 6 mV
Lead Channel Setting Pacing Amplitude: 2.5 V
Lead Channel Setting Pacing Amplitude: 2.5 V
Lead Channel Setting Pacing Pulse Width: 0.4 ms
Lead Channel Setting Sensing Sensitivity: 0.9 mV

## 2020-02-12 NOTE — Progress Notes (Signed)
Remote pacemaker transmission.   

## 2020-02-21 DIAGNOSIS — Z23 Encounter for immunization: Secondary | ICD-10-CM | POA: Diagnosis not present

## 2020-02-22 ENCOUNTER — Encounter: Payer: Self-pay | Admitting: Physician Assistant

## 2020-02-24 ENCOUNTER — Telehealth: Payer: Self-pay | Admitting: Unknown Physician Specialty

## 2020-02-24 NOTE — Telephone Encounter (Signed)
2nd call back: Called to Discuss with patient about Covid symptoms and the use of the monoclonal antibody infusion for those with mild to moderate Covid symptoms and at a high risk of hospitalization.     Pt appears to qualify for this infusion due to co-morbid conditions and/or a member of an at-risk group in accordance with the FDA Emergency Use Authorization.    Unable to reach pt

## 2020-02-26 DIAGNOSIS — Z96652 Presence of left artificial knee joint: Secondary | ICD-10-CM | POA: Diagnosis not present

## 2020-02-26 DIAGNOSIS — Z471 Aftercare following joint replacement surgery: Secondary | ICD-10-CM | POA: Diagnosis not present

## 2020-02-27 ENCOUNTER — Other Ambulatory Visit: Payer: Self-pay | Admitting: Internal Medicine

## 2020-02-27 ENCOUNTER — Other Ambulatory Visit: Payer: Self-pay

## 2020-02-27 ENCOUNTER — Ambulatory Visit (INDEPENDENT_AMBULATORY_CARE_PROVIDER_SITE_OTHER): Payer: Medicare Other

## 2020-02-27 DIAGNOSIS — Z7901 Long term (current) use of anticoagulants: Secondary | ICD-10-CM

## 2020-02-27 DIAGNOSIS — I82722 Chronic embolism and thrombosis of deep veins of left upper extremity: Secondary | ICD-10-CM

## 2020-02-27 LAB — POCT INR: INR: 2.2 (ref 2.0–3.0)

## 2020-02-27 NOTE — Patient Instructions (Signed)
Continue with 1/2 tablet daily except 1 tablet each Monday and Friday.  Repeat INR in 6 weeks.

## 2020-02-29 DIAGNOSIS — Z20822 Contact with and (suspected) exposure to covid-19: Secondary | ICD-10-CM | POA: Diagnosis not present

## 2020-03-01 ENCOUNTER — Other Ambulatory Visit: Payer: Self-pay | Admitting: Internal Medicine

## 2020-04-14 ENCOUNTER — Ambulatory Visit (INDEPENDENT_AMBULATORY_CARE_PROVIDER_SITE_OTHER): Payer: Medicare Other

## 2020-04-14 ENCOUNTER — Other Ambulatory Visit: Payer: Self-pay

## 2020-04-14 DIAGNOSIS — I82722 Chronic embolism and thrombosis of deep veins of left upper extremity: Secondary | ICD-10-CM

## 2020-04-14 DIAGNOSIS — Z7901 Long term (current) use of anticoagulants: Secondary | ICD-10-CM

## 2020-04-14 LAB — POCT INR: INR: 2.8 (ref 2.0–3.0)

## 2020-04-14 NOTE — Patient Instructions (Signed)
Continue with 1/2 tablet daily except 1 tablet each Monday and Friday.  Repeat INR in 6 weeks.

## 2020-04-30 DIAGNOSIS — Z125 Encounter for screening for malignant neoplasm of prostate: Secondary | ICD-10-CM | POA: Diagnosis not present

## 2020-04-30 DIAGNOSIS — E785 Hyperlipidemia, unspecified: Secondary | ICD-10-CM | POA: Diagnosis not present

## 2020-05-07 DIAGNOSIS — Z7901 Long term (current) use of anticoagulants: Secondary | ICD-10-CM | POA: Diagnosis not present

## 2020-05-07 DIAGNOSIS — E663 Overweight: Secondary | ICD-10-CM | POA: Diagnosis not present

## 2020-05-07 DIAGNOSIS — R030 Elevated blood-pressure reading, without diagnosis of hypertension: Secondary | ICD-10-CM | POA: Diagnosis not present

## 2020-05-07 DIAGNOSIS — Z950 Presence of cardiac pacemaker: Secondary | ICD-10-CM | POA: Diagnosis not present

## 2020-05-07 DIAGNOSIS — Z0000 Encounter for general adult medical examination without abnormal findings: Secondary | ICD-10-CM | POA: Diagnosis not present

## 2020-05-07 DIAGNOSIS — R82998 Other abnormal findings in urine: Secondary | ICD-10-CM | POA: Diagnosis not present

## 2020-05-07 DIAGNOSIS — Z1212 Encounter for screening for malignant neoplasm of rectum: Secondary | ICD-10-CM | POA: Diagnosis not present

## 2020-05-08 ENCOUNTER — Ambulatory Visit (INDEPENDENT_AMBULATORY_CARE_PROVIDER_SITE_OTHER): Payer: Medicare Other

## 2020-05-08 DIAGNOSIS — I495 Sick sinus syndrome: Secondary | ICD-10-CM

## 2020-05-09 LAB — CUP PACEART REMOTE DEVICE CHECK
Battery Remaining Longevity: 9 mo
Battery Voltage: 2.88 V
Brady Statistic AP VP Percent: 1.58 %
Brady Statistic AP VS Percent: 91.71 %
Brady Statistic AS VP Percent: 0.1 %
Brady Statistic AS VS Percent: 6.61 %
Brady Statistic RA Percent Paced: 89.64 %
Brady Statistic RV Percent Paced: 1.6 %
Date Time Interrogation Session: 20211223170458
Implantable Lead Implant Date: 20140130
Implantable Lead Implant Date: 20140130
Implantable Lead Location: 753859
Implantable Lead Location: 753860
Implantable Pulse Generator Implant Date: 20140130
Lead Channel Impedance Value: 494 Ohm
Lead Channel Impedance Value: 589 Ohm
Lead Channel Impedance Value: 646 Ohm
Lead Channel Impedance Value: 684 Ohm
Lead Channel Pacing Threshold Amplitude: 0.625 V
Lead Channel Pacing Threshold Amplitude: 1.375 V
Lead Channel Pacing Threshold Pulse Width: 0.4 ms
Lead Channel Pacing Threshold Pulse Width: 0.4 ms
Lead Channel Sensing Intrinsic Amplitude: 3.625 mV
Lead Channel Sensing Intrinsic Amplitude: 3.625 mV
Lead Channel Sensing Intrinsic Amplitude: 6.5 mV
Lead Channel Sensing Intrinsic Amplitude: 6.5 mV
Lead Channel Setting Pacing Amplitude: 2.5 V
Lead Channel Setting Pacing Amplitude: 2.5 V
Lead Channel Setting Pacing Pulse Width: 0.4 ms
Lead Channel Setting Sensing Sensitivity: 0.9 mV

## 2020-05-21 NOTE — Progress Notes (Signed)
Remote pacemaker transmission.   

## 2020-05-26 ENCOUNTER — Ambulatory Visit (INDEPENDENT_AMBULATORY_CARE_PROVIDER_SITE_OTHER): Payer: Medicare Other

## 2020-05-26 ENCOUNTER — Other Ambulatory Visit: Payer: Self-pay

## 2020-05-26 DIAGNOSIS — Z7901 Long term (current) use of anticoagulants: Secondary | ICD-10-CM | POA: Diagnosis not present

## 2020-05-26 DIAGNOSIS — I82722 Chronic embolism and thrombosis of deep veins of left upper extremity: Secondary | ICD-10-CM

## 2020-05-26 LAB — POCT INR: INR: 2.6 (ref 2.0–3.0)

## 2020-05-26 NOTE — Patient Instructions (Signed)
Continue with 1/2 tablet daily except 1 tablet each Monday and Friday.  Repeat INR in 6 weeks.

## 2020-05-29 DIAGNOSIS — Z96652 Presence of left artificial knee joint: Secondary | ICD-10-CM | POA: Diagnosis not present

## 2020-05-29 DIAGNOSIS — M25562 Pain in left knee: Secondary | ICD-10-CM | POA: Diagnosis not present

## 2020-07-01 DIAGNOSIS — Z96652 Presence of left artificial knee joint: Secondary | ICD-10-CM | POA: Diagnosis not present

## 2020-07-01 DIAGNOSIS — M25562 Pain in left knee: Secondary | ICD-10-CM | POA: Diagnosis not present

## 2020-07-08 ENCOUNTER — Other Ambulatory Visit: Payer: Self-pay

## 2020-07-08 ENCOUNTER — Encounter: Payer: Self-pay | Admitting: Internal Medicine

## 2020-07-08 ENCOUNTER — Ambulatory Visit (INDEPENDENT_AMBULATORY_CARE_PROVIDER_SITE_OTHER): Payer: Medicare Other | Admitting: *Deleted

## 2020-07-08 ENCOUNTER — Ambulatory Visit (INDEPENDENT_AMBULATORY_CARE_PROVIDER_SITE_OTHER): Payer: Medicare Other | Admitting: Internal Medicine

## 2020-07-08 VITALS — BP 120/68 | HR 89 | Ht 70.0 in | Wt 190.6 lb

## 2020-07-08 DIAGNOSIS — I480 Paroxysmal atrial fibrillation: Secondary | ICD-10-CM

## 2020-07-08 DIAGNOSIS — I4891 Unspecified atrial fibrillation: Secondary | ICD-10-CM

## 2020-07-08 DIAGNOSIS — Z7901 Long term (current) use of anticoagulants: Secondary | ICD-10-CM | POA: Diagnosis not present

## 2020-07-08 DIAGNOSIS — I471 Supraventricular tachycardia: Secondary | ICD-10-CM

## 2020-07-08 DIAGNOSIS — Z950 Presence of cardiac pacemaker: Secondary | ICD-10-CM

## 2020-07-08 DIAGNOSIS — I82722 Chronic embolism and thrombosis of deep veins of left upper extremity: Secondary | ICD-10-CM | POA: Diagnosis not present

## 2020-07-08 DIAGNOSIS — I10 Essential (primary) hypertension: Secondary | ICD-10-CM | POA: Diagnosis not present

## 2020-07-08 DIAGNOSIS — I495 Sick sinus syndrome: Secondary | ICD-10-CM | POA: Diagnosis not present

## 2020-07-08 LAB — POCT INR: INR: 3.1 — AB (ref 2.0–3.0)

## 2020-07-08 NOTE — Patient Instructions (Signed)
Medication Instructions:  Your physician recommends that you continue on your current medications as directed. Please refer to the Current Medication list given to you today.  Labwork: None ordered.  Testing/Procedures: None ordered.  Follow-Up: Your physician wants you to follow-up in: one year with Cristopher Peru, MD or one of the following Advanced Practice Providers on your designated Care Team:    Chanetta Marshall, NP  [name], PA-C  Legrand Como "[name]" [place], [place]  Remote monitoring is used to monitor your Pacemaker from home. This monitoring reduces the number of office visits required to check your device to one time per year. It allows [place] to keep an eye on the functioning of your device to ensure it is working properly. You are scheduled for a device check from home on 08/07/2020. You may send your transmission at any time that day. If you have a wireless device, the transmission will be sent automatically. After your physician reviews your transmission, you will receive a postcard with your next transmission date.  Any Other Special Instructions Will Be Listed Below (If Applicable).  If you need a refill on your cardiac medications before your next appointment, please call your pharmacy.

## 2020-07-08 NOTE — Progress Notes (Signed)
HPI [name] return stoday for followup. He is a pleasant 80yo man with symptomatic sinus node dysfunction, s/p PPM, HTN, and dyslipidemia. He swims and notes that his left arm still swells while he is swimming but then goes back to normal. He does not feel his atrial fib. No edema. No chest pain or sob.He does not have palpitations.  No Known Allergies   Current Outpatient Medications  Medication Sig Dispense Refill  . warfarin (COUMADIN) 2.5 MG tablet TAKE 1 TABLET BY MOUTH DAILY OR AS DIRECTED 90 tablet 1  . atenolol (TENORMIN) 25 MG tablet TAKE 1 TABLET BY MOUTH EVERY DAY 90 tablet 3  . Calcium Citrate-Vitamin D (CALCIUM CITRATE + PO) Take 1 tablet by mouth daily.    . Multiple Vitamin (MULTIVITAMIN WITH MINERALS) TABS Take 1 tablet by mouth daily.    . Omega-3 Fatty Acids (FISH OIL) 300 MG CAPS Take 1 capsule by mouth daily.    . simvastatin (ZOCOR) 40 MG tablet Take 1 tablet (40 mg total) by mouth daily. 90 tablet 2   No current facility-administered medications for this visit.     Past Medical History:  Diagnosis Date  . 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 Brady-tachy syndrome ([place])   . Chronic anticoagulation, on coumadin for PAF 06/16/2012  . Clotting disorder (HCC)    hx of DVT  . Dysrhythmia   . H/O cardiovascular stress test    normal, done as a baseline , ? where it was done, 4-5 yrs. ago  . Hyperlipidemia 06/16/2012  . Hypertension   . Pacemaker    Medtronic; implanted 06/15/12  . PAF (paroxysmal atrial fibrillation) (HCC)     ROS:   All systems reviewed and negative except as noted in the HPI.   Past Surgical History:  Procedure Laterality Date  . COLONOSCOPY    . INSERT / REPLACE / REMOVE PACEMAKER  06/15/2012   dual chamber  . KNEE ARTHROPLASTY  04/19/2012   Procedure: COMPUTER ASSISTED TOTAL KNEE ARTHROPLASTY;  Surgeon: Alta Corning, MD;  Location: [facility];  Service: Orthopedics;  Laterality: Right;  GENERAL WITH  PRE OP FEMORAL NERVE BLOCK  . PERMANENT PACEMAKER INSERTION N/A 06/15/2012   Procedure: PERMANENT PACEMAKER INSERTION;  Surgeon: Sanda Klein, MD;  Location: [facility] CATH [facility];  Service: Cardiovascular;  Laterality: N/A;  . TONSILLECTOMY    . TOTAL HIP ARTHROPLASTY Right 05/21/2016   Procedure: TOTAL HIP ARTHROPLASTY ANTERIOR APPROACH;  Surgeon: Dorna Leitz, MD;  Location: [facility];  Service: Orthopedics;  Laterality: Right;  . TOTAL KNEE ARTHROPLASTY  04/19/2012   RIGHT KNEE     Family History  Problem Relation Age of Onset  . Heart disease Father   . Colon cancer Neg Hx   . Esophageal cancer Neg Hx   . Rectal cancer Neg Hx   . Stomach cancer Neg Hx      Social History   Socioeconomic History  . Marital status: Married    Spouse name: Not on file  . Number of children: 2  . Years of education: Not on file  . Highest education level: Not on file  Occupational History  . Occupation: retired  Tobacco Use  . Smoking status: Never Smoker  . Smokeless tobacco: Never Used  Vaping Use  . Vaping Use: Never used  Substance and Sexual Activity  . Alcohol use: Yes    Comment: week- 1-2 drinks, 1-2 beers   .  Drug use: No  . Sexual activity: Not on file  Other Topics Concern  . Not on file  Social History Narrative  . Not on file   Social Determinants of Health   Financial Resource Strain: Not on file  Food Insecurity: Not on file  Transportation Needs: Not on file  Physical Activity: Not on file  Stress: Not on file  Social Connections: Not on file  Intimate Partner Violence: Not on file     There were no vitals taken for this visit.  Physical Exam:  Well appearing NAD HEENT: Unremarkable Neck:  No JVD, no thyromegally Lymphatics:  No adenopathy Back:  No CVA tenderness Lungs:  Clear HEART:  Regular rate rhythm, no murmurs, no rubs, no clicks Abd:  soft, positive bowel sounds, no organomegally, no rebound, no guarding Ext:  2 plus pulses, no edema, no cyanosis, no  clubbing Skin:  No rashes no nodules Neuro:  CN II through XII intact, motor grossly intact  EKG - nsr  DEVICE  Normal device function.  See PaceArt for details.   Assess/Plan: 1. SVT - he has minimal symptoms. He will continue his current meds. None since his last visit. 2. PAF - he is in rhythm 98% of the time.  3. Sinus node dysfunction - he is asymptomatic, s/p PPM insertion and is pacing about 96% of the time 4. HTN - his bp is well controlled. He will continue his current meds.  Carleene Overlie Taylor,MD

## 2020-07-08 NOTE — Patient Instructions (Signed)
Description   Today take have a green leafy vegetable then continue with 1/2 tablet daily except 1 tablet each Monday and Friday.  Repeat INR in 6 weeks.

## 2020-08-03 ENCOUNTER — Other Ambulatory Visit: Payer: Self-pay | Admitting: Internal Medicine

## 2020-08-04 DIAGNOSIS — H43813 Vitreous degeneration, bilateral: Secondary | ICD-10-CM | POA: Diagnosis not present

## 2020-08-04 DIAGNOSIS — H35371 Puckering of macula, right eye: Secondary | ICD-10-CM | POA: Diagnosis not present

## 2020-08-04 DIAGNOSIS — H524 Presbyopia: Secondary | ICD-10-CM | POA: Diagnosis not present

## 2020-08-04 DIAGNOSIS — H26493 Other secondary cataract, bilateral: Secondary | ICD-10-CM | POA: Diagnosis not present

## 2020-08-07 ENCOUNTER — Ambulatory Visit (INDEPENDENT_AMBULATORY_CARE_PROVIDER_SITE_OTHER): Payer: Medicare Other

## 2020-08-07 DIAGNOSIS — I495 Sick sinus syndrome: Secondary | ICD-10-CM | POA: Diagnosis not present

## 2020-08-09 LAB — CUP PACEART REMOTE DEVICE CHECK
Battery Remaining Longevity: 7 mo
Battery Voltage: 2.86 V
Brady Statistic AP VP Percent: 0.59 %
Brady Statistic AP VS Percent: 92.01 %
Brady Statistic AS VP Percent: 0.03 %
Brady Statistic AS VS Percent: 7.38 %
Brady Statistic RA Percent Paced: 91.29 %
Brady Statistic RV Percent Paced: 0.6 %
Date Time Interrogation Session: 20220325111122
Implantable Lead Implant Date: 20140130
Implantable Lead Implant Date: 20140130
Implantable Lead Location: 753859
Implantable Lead Location: 753860
Implantable Pulse Generator Implant Date: 20140130
Lead Channel Impedance Value: 494 Ohm
Lead Channel Impedance Value: 570 Ohm
Lead Channel Impedance Value: 665 Ohm
Lead Channel Impedance Value: 703 Ohm
Lead Channel Pacing Threshold Amplitude: 0.5 V
Lead Channel Pacing Threshold Amplitude: 1.75 V
Lead Channel Pacing Threshold Pulse Width: 0.4 ms
Lead Channel Pacing Threshold Pulse Width: 0.4 ms
Lead Channel Sensing Intrinsic Amplitude: 2.75 mV
Lead Channel Sensing Intrinsic Amplitude: 2.75 mV
Lead Channel Sensing Intrinsic Amplitude: 5.5 mV
Lead Channel Sensing Intrinsic Amplitude: 5.5 mV
Lead Channel Setting Pacing Amplitude: 2.5 V
Lead Channel Setting Pacing Amplitude: 2.5 V
Lead Channel Setting Pacing Pulse Width: 0.4 ms
Lead Channel Setting Sensing Sensitivity: 0.9 mV

## 2020-08-18 ENCOUNTER — Other Ambulatory Visit: Payer: Self-pay

## 2020-08-18 ENCOUNTER — Ambulatory Visit (INDEPENDENT_AMBULATORY_CARE_PROVIDER_SITE_OTHER): Payer: Medicare Other

## 2020-08-18 DIAGNOSIS — Z7901 Long term (current) use of anticoagulants: Secondary | ICD-10-CM

## 2020-08-18 DIAGNOSIS — I82722 Chronic embolism and thrombosis of deep veins of left upper extremity: Secondary | ICD-10-CM | POA: Diagnosis not present

## 2020-08-18 LAB — POCT INR: INR: 3.6 — AB (ref 2.0–3.0)

## 2020-08-18 NOTE — Patient Instructions (Signed)
Hold today only and  then continue with 1/2 tablet daily except 1 tablet each Monday and Friday.  Repeat INR in 6 weeks.

## 2020-08-18 NOTE — Progress Notes (Signed)
Remote pacemaker transmission.   

## 2020-09-29 ENCOUNTER — Other Ambulatory Visit: Payer: Self-pay

## 2020-09-29 ENCOUNTER — Ambulatory Visit (INDEPENDENT_AMBULATORY_CARE_PROVIDER_SITE_OTHER): Payer: Medicare Other

## 2020-09-29 DIAGNOSIS — Z471 Aftercare following joint replacement surgery: Secondary | ICD-10-CM | POA: Diagnosis not present

## 2020-09-29 DIAGNOSIS — M25562 Pain in left knee: Secondary | ICD-10-CM | POA: Diagnosis not present

## 2020-09-29 DIAGNOSIS — I82722 Chronic embolism and thrombosis of deep veins of left upper extremity: Secondary | ICD-10-CM

## 2020-09-29 DIAGNOSIS — Z96652 Presence of left artificial knee joint: Secondary | ICD-10-CM | POA: Diagnosis not present

## 2020-09-29 DIAGNOSIS — Z7901 Long term (current) use of anticoagulants: Secondary | ICD-10-CM | POA: Diagnosis not present

## 2020-09-29 LAB — POCT INR: INR: 2.5 (ref 2.0–3.0)

## 2020-09-29 NOTE — Patient Instructions (Signed)
continue with 1/2 tablet daily except 1 tablet each Monday and Friday.  Repeat INR in 6 weeks.

## 2020-10-27 DIAGNOSIS — L570 Actinic keratosis: Secondary | ICD-10-CM | POA: Diagnosis not present

## 2020-10-27 DIAGNOSIS — X32XXXA Exposure to sunlight, initial encounter: Secondary | ICD-10-CM | POA: Diagnosis not present

## 2020-11-06 LAB — CUP PACEART REMOTE DEVICE CHECK
Battery Remaining Longevity: 3 mo
Battery Voltage: 2.85 V
Brady Statistic AP VP Percent: 1.42 %
Brady Statistic AP VS Percent: 92.84 %
Brady Statistic AS VP Percent: 0.08 %
Brady Statistic AS VS Percent: 5.67 %
Brady Statistic RA Percent Paced: 92.71 %
Brady Statistic RV Percent Paced: 1.46 %
Date Time Interrogation Session: 20220623075734
Implantable Lead Implant Date: 20140130
Implantable Lead Implant Date: 20140130
Implantable Lead Location: 753859
Implantable Lead Location: 753860
Implantable Pulse Generator Implant Date: 20140130
Lead Channel Impedance Value: 475 Ohm
Lead Channel Impedance Value: 532 Ohm
Lead Channel Impedance Value: 684 Ohm
Lead Channel Impedance Value: 722 Ohm
Lead Channel Pacing Threshold Amplitude: 0.625 V
Lead Channel Pacing Threshold Amplitude: 1.875 V
Lead Channel Pacing Threshold Pulse Width: 0.4 ms
Lead Channel Pacing Threshold Pulse Width: 0.4 ms
Lead Channel Sensing Intrinsic Amplitude: 3.25 mV
Lead Channel Sensing Intrinsic Amplitude: 3.25 mV
Lead Channel Sensing Intrinsic Amplitude: 5.25 mV
Lead Channel Sensing Intrinsic Amplitude: 5.25 mV
Lead Channel Setting Pacing Amplitude: 2.5 V
Lead Channel Setting Pacing Amplitude: 2.5 V
Lead Channel Setting Pacing Pulse Width: 0.4 ms
Lead Channel Setting Sensing Sensitivity: 0.9 mV

## 2020-11-10 ENCOUNTER — Ambulatory Visit (INDEPENDENT_AMBULATORY_CARE_PROVIDER_SITE_OTHER): Payer: Medicare Other | Admitting: Pharmacist Clinician (PhC)/ Clinical Pharmacy Specialist

## 2020-11-10 ENCOUNTER — Other Ambulatory Visit: Payer: Self-pay

## 2020-11-10 DIAGNOSIS — Z7901 Long term (current) use of anticoagulants: Secondary | ICD-10-CM

## 2020-11-10 DIAGNOSIS — I82722 Chronic embolism and thrombosis of deep veins of left upper extremity: Secondary | ICD-10-CM

## 2020-11-10 LAB — POCT INR: INR: 2.9 (ref 2.0–3.0)

## 2020-11-19 DIAGNOSIS — I4891 Unspecified atrial fibrillation: Secondary | ICD-10-CM | POA: Diagnosis not present

## 2020-11-19 DIAGNOSIS — E785 Hyperlipidemia, unspecified: Secondary | ICD-10-CM | POA: Diagnosis not present

## 2020-11-19 DIAGNOSIS — R030 Elevated blood-pressure reading, without diagnosis of hypertension: Secondary | ICD-10-CM | POA: Diagnosis not present

## 2020-11-19 DIAGNOSIS — Z7901 Long term (current) use of anticoagulants: Secondary | ICD-10-CM | POA: Diagnosis not present

## 2020-11-19 DIAGNOSIS — E663 Overweight: Secondary | ICD-10-CM | POA: Diagnosis not present

## 2020-12-09 ENCOUNTER — Ambulatory Visit (INDEPENDENT_AMBULATORY_CARE_PROVIDER_SITE_OTHER): Payer: Medicare Other

## 2020-12-09 DIAGNOSIS — I495 Sick sinus syndrome: Secondary | ICD-10-CM

## 2020-12-09 LAB — CUP PACEART REMOTE DEVICE CHECK
Battery Remaining Longevity: 2 mo
Battery Voltage: 2.84 V
Brady Statistic AP VP Percent: 5.33 %
Brady Statistic AP VS Percent: 86.37 %
Brady Statistic AS VP Percent: 0.32 %
Brady Statistic AS VS Percent: 7.98 %
Brady Statistic RA Percent Paced: 87.71 %
Brady Statistic RV Percent Paced: 5.5 %
Date Time Interrogation Session: 20220726092559
Implantable Lead Implant Date: 20140130
Implantable Lead Implant Date: 20140130
Implantable Lead Location: 753859
Implantable Lead Location: 753860
Implantable Pulse Generator Implant Date: 20140130
Lead Channel Impedance Value: 494 Ohm
Lead Channel Impedance Value: 570 Ohm
Lead Channel Impedance Value: 684 Ohm
Lead Channel Impedance Value: 722 Ohm
Lead Channel Pacing Threshold Amplitude: 0.625 V
Lead Channel Pacing Threshold Amplitude: 1.875 V
Lead Channel Pacing Threshold Pulse Width: 0.4 ms
Lead Channel Pacing Threshold Pulse Width: 0.4 ms
Lead Channel Sensing Intrinsic Amplitude: 3 mV
Lead Channel Sensing Intrinsic Amplitude: 3 mV
Lead Channel Sensing Intrinsic Amplitude: 6 mV
Lead Channel Sensing Intrinsic Amplitude: 6 mV
Lead Channel Setting Pacing Amplitude: 2.5 V
Lead Channel Setting Pacing Amplitude: 2.5 V
Lead Channel Setting Pacing Pulse Width: 0.4 ms
Lead Channel Setting Sensing Sensitivity: 0.9 mV

## 2020-12-22 ENCOUNTER — Ambulatory Visit (INDEPENDENT_AMBULATORY_CARE_PROVIDER_SITE_OTHER): Payer: Medicare Other

## 2020-12-22 ENCOUNTER — Other Ambulatory Visit: Payer: Self-pay

## 2020-12-22 DIAGNOSIS — Z7901 Long term (current) use of anticoagulants: Secondary | ICD-10-CM | POA: Diagnosis not present

## 2020-12-22 DIAGNOSIS — I82722 Chronic embolism and thrombosis of deep veins of left upper extremity: Secondary | ICD-10-CM | POA: Diagnosis not present

## 2020-12-22 LAB — POCT INR: INR: 2.8 (ref 2.0–3.0)

## 2020-12-22 NOTE — Patient Instructions (Signed)
Continue with 1/2 tablet daily except 1 tablet each Monday and Friday.  Repeat INR in 6 weeks.

## 2021-01-02 NOTE — Progress Notes (Signed)
Remote pacemaker transmission.   

## 2021-01-05 ENCOUNTER — Other Ambulatory Visit: Payer: Self-pay | Admitting: Internal Medicine

## 2021-01-08 ENCOUNTER — Other Ambulatory Visit: Payer: Self-pay | Admitting: Internal Medicine

## 2021-01-08 ENCOUNTER — Other Ambulatory Visit: Payer: Self-pay

## 2021-01-08 MED ORDER — WARFARIN SODIUM 2.5 MG PO TABS: ORAL_TABLET | ORAL | 0 refills | Status: DC

## 2021-01-09 ENCOUNTER — Ambulatory Visit (INDEPENDENT_AMBULATORY_CARE_PROVIDER_SITE_OTHER): Payer: Medicare Other

## 2021-01-09 DIAGNOSIS — I495 Sick sinus syndrome: Secondary | ICD-10-CM

## 2021-01-13 LAB — CUP PACEART REMOTE DEVICE CHECK
Battery Remaining Longevity: 2 mo
Battery Voltage: 2.83 V
Brady Statistic AP VP Percent: 1.24 %
Brady Statistic AP VS Percent: 95.52 %
Brady Statistic AS VP Percent: 0.19 %
Brady Statistic AS VS Percent: 3.05 %
Brady Statistic RA Percent Paced: 96.04 %
Brady Statistic RV Percent Paced: 1.42 %
Date Time Interrogation Session: 20220826162157
Implantable Lead Implant Date: 20140130
Implantable Lead Implant Date: 20140130
Implantable Lead Location: 753859
Implantable Lead Location: 753860
Implantable Pulse Generator Implant Date: 20140130
Lead Channel Impedance Value: 494 Ohm
Lead Channel Impedance Value: 570 Ohm
Lead Channel Impedance Value: 684 Ohm
Lead Channel Impedance Value: 722 Ohm
Lead Channel Pacing Threshold Amplitude: 0.75 V
Lead Channel Pacing Threshold Amplitude: 1.875 V
Lead Channel Pacing Threshold Pulse Width: 0.4 ms
Lead Channel Pacing Threshold Pulse Width: 0.4 ms
Lead Channel Sensing Intrinsic Amplitude: 2.625 mV
Lead Channel Sensing Intrinsic Amplitude: 2.625 mV
Lead Channel Sensing Intrinsic Amplitude: 5 mV
Lead Channel Sensing Intrinsic Amplitude: 5 mV
Lead Channel Setting Pacing Amplitude: 2.5 V
Lead Channel Setting Pacing Amplitude: 2.5 V
Lead Channel Setting Pacing Pulse Width: 0.4 ms
Lead Channel Setting Sensing Sensitivity: 0.9 mV

## 2021-01-21 NOTE — Progress Notes (Signed)
Remote pacemaker transmission.   

## 2021-01-21 NOTE — Addendum Note (Signed)
Addended by: Douglass Rivers D on: 01/21/2021 09:21 PM   Modules accepted: Level of Service

## 2021-02-02 ENCOUNTER — Ambulatory Visit (INDEPENDENT_AMBULATORY_CARE_PROVIDER_SITE_OTHER): Payer: Medicare Other

## 2021-02-02 ENCOUNTER — Other Ambulatory Visit: Payer: Self-pay

## 2021-02-02 DIAGNOSIS — I82722 Chronic embolism and thrombosis of deep veins of left upper extremity: Secondary | ICD-10-CM

## 2021-02-02 DIAGNOSIS — Z7901 Long term (current) use of anticoagulants: Secondary | ICD-10-CM | POA: Diagnosis not present

## 2021-02-02 LAB — POCT INR: INR: 2.7 (ref 2.0–3.0)

## 2021-02-02 NOTE — Patient Instructions (Signed)
Continue with 1/2 tablet daily except 1 tablet each Monday and Friday.  Repeat INR in 6 weeks.

## 2021-02-09 ENCOUNTER — Ambulatory Visit (INDEPENDENT_AMBULATORY_CARE_PROVIDER_SITE_OTHER): Payer: Medicare Other

## 2021-02-09 DIAGNOSIS — I495 Sick sinus syndrome: Secondary | ICD-10-CM

## 2021-02-10 ENCOUNTER — Telehealth: Payer: Self-pay

## 2021-02-10 LAB — CUP PACEART REMOTE DEVICE CHECK
Battery Remaining Longevity: 1 mo
Battery Voltage: 2.83 V
Brady Statistic AP VP Percent: 0.68 %
Brady Statistic AP VS Percent: 96.33 %
Brady Statistic AS VP Percent: 0.02 %
Brady Statistic AS VS Percent: 2.98 %
Brady Statistic RA Percent Paced: 96.57 %
Brady Statistic RV Percent Paced: 0.69 %
Date Time Interrogation Session: 20220926104426
Implantable Lead Implant Date: 20140130
Implantable Lead Implant Date: 20140130
Implantable Lead Location: 753859
Implantable Lead Location: 753860
Implantable Pulse Generator Implant Date: 20140130
Lead Channel Impedance Value: 494 Ohm
Lead Channel Impedance Value: 589 Ohm
Lead Channel Impedance Value: 684 Ohm
Lead Channel Impedance Value: 741 Ohm
Lead Channel Pacing Threshold Amplitude: 0.75 V
Lead Channel Pacing Threshold Amplitude: 1.875 V
Lead Channel Pacing Threshold Pulse Width: 0.4 ms
Lead Channel Pacing Threshold Pulse Width: 0.4 ms
Lead Channel Sensing Intrinsic Amplitude: 2.875 mV
Lead Channel Sensing Intrinsic Amplitude: 2.875 mV
Lead Channel Sensing Intrinsic Amplitude: 5.25 mV
Lead Channel Sensing Intrinsic Amplitude: 5.25 mV
Lead Channel Setting Pacing Amplitude: 2.5 V
Lead Channel Setting Pacing Amplitude: 2.5 V
Lead Channel Setting Pacing Pulse Width: 0.4 ms
Lead Channel Setting Sensing Sensitivity: 0.9 mV

## 2021-02-10 NOTE — Telephone Encounter (Signed)
Scheduled billable monthly remote reviewed. Normal device function.   RRT (18-Jan-2021): REPLACE DEVICE. Less than 3 months to ERI (VVI/65).  Patient called and advised I will forward to scheduler and will make apt. With Dr. Lovena Le to discuss gen change. Richard [name]

## 2021-02-14 NOTE — Progress Notes (Signed)
Cardiology Office Note Date:  02/17/2021  Patient ID:  Richard [name], Richard [name] 1940/06/26, MRN [id number] PCP:  Burnard Bunting, MD  Electrophysiologist: Dr. Lovena Le    Chief Complaint:  needs gen change  History of Present Illness: Richard [name] is a 80 y.o. male with history of HTN, HLD, AFib, sinus node dysfunction w/PPM, SVT  He comes today to be seen for dr. Lovena Le, last seen by him in Feb 2022, doing well, no symptoms of his AF or SVT with only 2% burden.  No changes were made.  Device reached RRT 01/18/21  He is ding great. No complaints Golfs, swims regularly without exertional incapacities No CP, palpitations or cardiac awareness No SOB, DOE, will get a little winded with hills. No dizzy spells, near syncope or syncope. 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 where it was done, 4-5 yrs. ago   Hyperlipidemia 06/16/2012   Hypertension    Pacemaker    Medtronic; implanted 06/15/12   PAF (paroxysmal atrial fibrillation) ([place])     Past Surgical History:  Procedure Laterality Date   COLONOSCOPY     INSERT / REPLACE / REMOVE PACEMAKER  06/15/2012   dual chamber   KNEE ARTHROPLASTY  04/19/2012   Procedure: COMPUTER ASSISTED TOTAL KNEE ARTHROPLASTY;  Surgeon: Alta Corning, MD;  Location: [facility] [place];  Service: Orthopedics;  Laterality: Right;  GENERAL WITH PRE OP FEMORAL NERVE BLOCK   PERMANENT PACEMAKER INSERTION N/A 06/15/2012   Procedure: PERMANENT PACEMAKER INSERTION;  Surgeon: Sanda Klein, MD;  Location: [place] CATH LAB;  Service: Cardiovascular;  Laterality: N/A;   TONSILLECTOMY     TOTAL HIP  ARTHROPLASTY Right 05/21/2016   Procedure: TOTAL HIP ARTHROPLASTY ANTERIOR APPROACH;  Surgeon: Dorna Leitz, MD;  Location: [facility];  Service: Orthopedics;  Laterality: Right;   TOTAL KNEE ARTHROPLASTY  04/19/2012   RIGHT KNEE    Current Outpatient Medications  Medication Sig Dispense Refill   atenolol (TENORMIN) 25 MG tablet TAKE 1 TABLET BY MOUTH EVERY DAY 90 tablet 3   Calcium Citrate-Vitamin D (CALCIUM CITRATE + PO) Take 1 tablet by mouth daily.     Multiple Vitamin (MULTIVITAMIN WITH MINERALS) TABS Take 1 tablet by mouth daily.     Omega-3 Fatty Acids (FISH OIL) 300 MG CAPS Take 1 capsule by mouth daily.     simvastatin (ZOCOR) 40 MG tablet Take 1 tablet (40 mg total) by mouth daily. 90 tablet 2   warfarin (COUMADIN) 2.5 MG tablet TAKE 1 TABLET BY MOUTH DAILY OR AS DIRECTED 45 tablet 0   No current facility-administered medications for this visit.    Allergies:   Patient has no known allergies.   Social History:  The patient  reports that he has never smoked. He has never used smokeless tobacco. He reports current alcohol use. He reports that he does not use drugs.   Family History:  The patient's family history includes Heart disease in his father.  ROS:  Please see the history of present illness.    All other systems are reviewed and  otherwise negative.   PHYSICAL EXAM:  VS:  BP 116/70   Pulse 74   Ht 5\' 10"  (1.778 m)   Wt 189 lb 6.4 oz (85.9 kg)   SpO2 96%   BMI 27.18 kg/m  BMI: Body mass index is 27.18 kg/m. Well nourished, well developed, in no acute distress HEENT: normocephalic, atraumatic Neck: no JVD, carotid bruits or masses Cardiac:  RRR; no significant murmurs, no rubs, or gallops Lungs:  CTA b/l, no wheezing, rhonchi or rales Abd: soft, nontender MS: no deformity or atrophy Ext: no edema Skin: warm and dry, no rash Neuro:  No gross deficits appreciated Psych: euthymic mood, full affect  PPM site is stable, no tethering or discomfort   EKG:  Done today  and reviewed by myself shows  AP aced, VS, RBBB  Device interrogation done today and reviewed by myself:  Batter reached RRT 01/18/21 Lead measurements are good Apacing/dependent at 30bpm today  Recent Labs: No results found for requested labs within last 8760 hours.  No results found for requested labs within last 8760 hours.   CrCl cannot be calculated (Patient's most recent lab result is older than the maximum 21 days allowed.).   Wt Readings from Last 3 Encounters:  02/17/21 189 lb 6.4 oz (85.9 kg)  07/08/20 190 lb 9.6 oz (86.5 kg)  07/02/19 192 lb 6.4 oz (87.3 kg)     Other studies reviewed: Additional studies/records reviewed today include: summarized above  ASSESSMENT AND PLAN:  PPM Reached RRT  We reviewed generator change procedure, potential risks/benefits He is agreeable to proceed Will get an echo ahead of time  The patient prefers to wait until early November with some things he would like to do 1st Understands our timeline, can not go into Dec.   Paroxysmal Afib CHA2DS2Vasc 3, on warfarin, monitored and managed by [place] 1.6% % burden Most AMS are quite brief NSVT episodes, available EGMs reviewed are AF.RVR and fleeting  HTN Looks ok  Disposition: F/u with echo/labs, will plan warfarin hold 3 days unless labs deem otherwise, usual post procedure follow up   Current medicines are reviewed at length with the patient today.  The patient did not have any concerns regarding medicines.  Venetia Night, PA-C 02/17/2021 9:18 AM     [facility] [address] [place] [address] [phone] (office)  [phone] (fax)

## 2021-02-16 NOTE — Progress Notes (Signed)
Remote pacemaker transmission.   

## 2021-02-17 ENCOUNTER — Encounter: Payer: Self-pay | Admitting: *Deleted

## 2021-02-17 ENCOUNTER — Ambulatory Visit (INDEPENDENT_AMBULATORY_CARE_PROVIDER_SITE_OTHER): Payer: Medicare Other | Admitting: Physician Assistant

## 2021-02-17 ENCOUNTER — Encounter: Payer: Self-pay | Admitting: Physician Assistant

## 2021-02-17 ENCOUNTER — Other Ambulatory Visit: Payer: Self-pay

## 2021-02-17 VITALS — BP 116/70 | HR 74 | Ht 70.0 in | Wt 189.4 lb

## 2021-02-17 DIAGNOSIS — Z950 Presence of cardiac pacemaker: Secondary | ICD-10-CM

## 2021-02-17 DIAGNOSIS — Z01818 Encounter for other preprocedural examination: Secondary | ICD-10-CM

## 2021-02-17 DIAGNOSIS — I480 Paroxysmal atrial fibrillation: Secondary | ICD-10-CM

## 2021-02-17 DIAGNOSIS — Z01810 Encounter for preprocedural cardiovascular examination: Secondary | ICD-10-CM

## 2021-02-17 DIAGNOSIS — Z79899 Other long term (current) drug therapy: Secondary | ICD-10-CM

## 2021-02-17 DIAGNOSIS — I4891 Unspecified atrial fibrillation: Secondary | ICD-10-CM

## 2021-02-17 DIAGNOSIS — I495 Sick sinus syndrome: Secondary | ICD-10-CM | POA: Diagnosis not present

## 2021-02-17 DIAGNOSIS — Z45010 Encounter for checking and testing of cardiac pacemaker pulse generator [battery]: Secondary | ICD-10-CM | POA: Diagnosis not present

## 2021-02-17 LAB — CUP PACEART INCLINIC DEVICE CHECK
Battery Remaining Longevity: 1 mo
Battery Voltage: 2.82 V
Brady Statistic AP VP Percent: 1.81 %
Brady Statistic AP VS Percent: 92.9 %
Brady Statistic AS VP Percent: 0.12 %
Brady Statistic AS VS Percent: 5.16 %
Brady Statistic RA Percent Paced: 93.1 %
Brady Statistic RV Percent Paced: 1.9 %
Date Time Interrogation Session: 20221004091606
Implantable Lead Implant Date: 20140130
Implantable Lead Implant Date: 20140130
Implantable Lead Location: 753859
Implantable Lead Location: 753860
Implantable Pulse Generator Implant Date: 20140130
Lead Channel Impedance Value: 551 Ohm
Lead Channel Impedance Value: 627 Ohm
Lead Channel Impedance Value: 722 Ohm
Lead Channel Impedance Value: 760 Ohm
Lead Channel Pacing Threshold Amplitude: 0.75 V
Lead Channel Pacing Threshold Amplitude: 1.5 V
Lead Channel Pacing Threshold Pulse Width: 0.4 ms
Lead Channel Pacing Threshold Pulse Width: 0.4 ms
Lead Channel Sensing Intrinsic Amplitude: 3.375 mV
Lead Channel Sensing Intrinsic Amplitude: 3.375 mV
Lead Channel Sensing Intrinsic Amplitude: 5.25 mV
Lead Channel Sensing Intrinsic Amplitude: 5.625 mV
Lead Channel Setting Pacing Amplitude: 2.5 V
Lead Channel Setting Pacing Amplitude: 2.5 V
Lead Channel Setting Pacing Pulse Width: 0.4 ms
Lead Channel Setting Sensing Sensitivity: 0.9 mV

## 2021-02-17 NOTE — Patient Instructions (Addendum)
Medication Instructions:   Your physician recommends that you continue on your current medications as directed. Please refer to the Current Medication list given to you today.  *If you need a refill on your cardiac medications before your next appointment, please call your pharmacy*   Lab Work: RETURN ON THE DAY OF ECHOCARDIOGRAM  TO GET PRE OP LABS CBC BMET AND PT-INR  If you have labs (blood work) drawn today and your tests are completely normal, you will receive your results only by: MyChart Message (if you have MyChart) OR A paper copy in the mail If you have any lab test that is abnormal or we need to change your treatment, we will call you to review the results.   Testing/Procedures: SEE LETTER OF GENERATOR CHANGE ON 03-23-21 WITH DR Lovena Le   ASAP IN 2 WEEKS Your physician has requested that you have an echocardiogram. Echocardiography is a painless test that uses sound waves to create images of your heart. It provides your doctor with information about the size and shape of your heart and how well your heart's chambers and valves are working. This procedure takes approximately one hour. There are no restrictions for this procedure.     Follow-Up: At [facility], you and your health needs are our priority.  As part of our continuing mission to provide you with exceptional heart care, we have created designated Provider Care Teams.  These Care Teams include your primary Cardiologist (physician) and Advanced Practice Providers (APPs -  Physician Assistants and Nurse Practitioners) who all work together to provide you with the care you need, when you need it.  We recommend signing up for the patient portal called "MyChart".  Sign up information is provided on this After Visit Summary.  MyChart is used to connect with patients for Virtual Visits (Telemedicine).  Patients are able to view lab/test results, encounter notes, upcoming appointments, etc.  Non-urgent messages can be sent to  your provider as well.   To learn more about what you can do with MyChart, go to [email or web address].    Your next appointment:     10-14 DAYS AFTER 03-23-2021 WOUND CHECK   3 month(s)  The format for your next appointment:   In Person  Provider:   Cristopher Peru, MD   Other Instructions

## 2021-03-03 ENCOUNTER — Ambulatory Visit (HOSPITAL_COMMUNITY): Payer: Medicare Other | Attending: Cardiovascular Disease

## 2021-03-03 ENCOUNTER — Other Ambulatory Visit: Payer: Self-pay

## 2021-03-03 ENCOUNTER — Other Ambulatory Visit: Payer: Self-pay | Admitting: Internal Medicine

## 2021-03-03 ENCOUNTER — Ambulatory Visit (INDEPENDENT_AMBULATORY_CARE_PROVIDER_SITE_OTHER): Payer: Medicare Other

## 2021-03-03 ENCOUNTER — Other Ambulatory Visit: Payer: Medicare Other | Admitting: *Deleted

## 2021-03-03 DIAGNOSIS — I495 Sick sinus syndrome: Secondary | ICD-10-CM

## 2021-03-03 DIAGNOSIS — I82729 Chronic embolism and thrombosis of deep veins of unspecified upper extremity: Secondary | ICD-10-CM | POA: Diagnosis not present

## 2021-03-03 DIAGNOSIS — I82722 Chronic embolism and thrombosis of deep veins of left upper extremity: Secondary | ICD-10-CM | POA: Diagnosis not present

## 2021-03-03 DIAGNOSIS — Z7901 Long term (current) use of anticoagulants: Secondary | ICD-10-CM

## 2021-03-03 DIAGNOSIS — Z950 Presence of cardiac pacemaker: Secondary | ICD-10-CM

## 2021-03-03 DIAGNOSIS — Z01810 Encounter for preprocedural cardiovascular examination: Secondary | ICD-10-CM | POA: Diagnosis not present

## 2021-03-03 DIAGNOSIS — I4891 Unspecified atrial fibrillation: Secondary | ICD-10-CM

## 2021-03-03 DIAGNOSIS — Z79899 Other long term (current) drug therapy: Secondary | ICD-10-CM

## 2021-03-03 LAB — PROTIME-INR
INR: 3.6 — ABNORMAL HIGH (ref 0.9–1.2)
Prothrombin Time: 34.3 s — ABNORMAL HIGH (ref 9.1–12.0)

## 2021-03-03 LAB — ECHOCARDIOGRAM COMPLETE
AR max vel: 2.29 cm2
AV Area VTI: 2.46 cm2
AV Area mean vel: 2.25 cm2
AV Mean grad: 5.5 mmHg
AV Peak grad: 9.8 mmHg
Ao pk vel: 1.57 m/s
Area-P 1/2: 2.9 cm2
S' Lateral: 2.4 cm

## 2021-03-03 LAB — BASIC METABOLIC PANEL
BUN/Creatinine Ratio: 24 (ref 10–24)
BUN: 21 mg/dL (ref 8–27)
CO2: 32 mmol/L — ABNORMAL HIGH (ref 20–29)
Calcium: 9.7 mg/dL (ref 8.6–10.2)
Chloride: 106 mmol/L (ref 96–106)
Creatinine, Ser: 0.88 mg/dL (ref 0.76–1.27)
Glucose: 110 mg/dL — ABNORMAL HIGH (ref 70–99)
Potassium: 5.2 mmol/L (ref 3.5–5.2)
Sodium: 142 mmol/L (ref 134–144)
eGFR: 87 mL/min/{1.73_m2} (ref >59)

## 2021-03-03 LAB — POCT INR: INR: 3.5 — AB (ref 2.0–3.0)

## 2021-03-03 LAB — CBC
Hematocrit: 43.3 % (ref 37.5–51.0)
Hemoglobin: 14.6 g/dL (ref 13.0–17.7)
MCH: 31.6 pg (ref 26.6–33.0)
MCHC: 33.7 g/dL (ref 31.5–35.7)
MCV: 94 fL (ref 79–97)
Platelets: 218 10*3/uL (ref 150–450)
RBC: 4.62 x10E6/uL (ref 4.14–5.80)
RDW: 14 % (ref 11.6–15.4)
WBC: 8.7 10*3/uL (ref 3.4–10.8)

## 2021-03-03 NOTE — Patient Instructions (Signed)
Description   Skip today's dosage of Warfarin, then resume same dosage 1/2 tablet daily except 1 tablet each Mondays and Fridays.  Hold Warfarin 2 days prior to pacemaker changeout on 03/23/21 per MD instructions.   Repeat INR in 1 week after pacemaker changeout.

## 2021-03-12 ENCOUNTER — Ambulatory Visit (INDEPENDENT_AMBULATORY_CARE_PROVIDER_SITE_OTHER): Payer: Medicare Other

## 2021-03-12 DIAGNOSIS — I4891 Unspecified atrial fibrillation: Secondary | ICD-10-CM

## 2021-03-12 LAB — CUP PACEART REMOTE DEVICE CHECK
Battery Remaining Longevity: 1 mo
Battery Voltage: 2.82 V
Brady Statistic AP VP Percent: 1.4 %
Brady Statistic AP VS Percent: 94.26 %
Brady Statistic AS VP Percent: 0.16 %
Brady Statistic AS VS Percent: 4.19 %
Brady Statistic RA Percent Paced: 94.62 %
Brady Statistic RV Percent Paced: 1.54 %
Date Time Interrogation Session: 20221027100202
Implantable Lead Implant Date: 20140130
Implantable Lead Implant Date: 20140130
Implantable Lead Location: 753859
Implantable Lead Location: 753860
Implantable Pulse Generator Implant Date: 20140130
Lead Channel Impedance Value: 456 Ohm
Lead Channel Impedance Value: 551 Ohm
Lead Channel Impedance Value: 703 Ohm
Lead Channel Impedance Value: 741 Ohm
Lead Channel Pacing Threshold Amplitude: 0.75 V
Lead Channel Pacing Threshold Amplitude: 1.75 V
Lead Channel Pacing Threshold Pulse Width: 0.4 ms
Lead Channel Pacing Threshold Pulse Width: 0.4 ms
Lead Channel Sensing Intrinsic Amplitude: 3 mV
Lead Channel Sensing Intrinsic Amplitude: 3 mV
Lead Channel Sensing Intrinsic Amplitude: 5.125 mV
Lead Channel Sensing Intrinsic Amplitude: 5.125 mV
Lead Channel Setting Pacing Amplitude: 2.5 V
Lead Channel Setting Pacing Amplitude: 2.5 V
Lead Channel Setting Pacing Pulse Width: 0.4 ms
Lead Channel Setting Sensing Sensitivity: 0.9 mV

## 2021-03-13 ENCOUNTER — Other Ambulatory Visit: Payer: Self-pay | Admitting: Internal Medicine

## 2021-03-16 ENCOUNTER — Other Ambulatory Visit: Payer: Self-pay | Admitting: *Deleted

## 2021-03-16 MED ORDER — WARFARIN SODIUM 2.5 MG PO TABS: ORAL_TABLET | ORAL | 0 refills | Status: DC

## 2021-03-20 NOTE — Pre-Procedure Instructions (Signed)
Attempted to call patient regarding procedure instruction.  Left voice mail on the following items: Arrival time 0930 Nothing to eat or drink after midnight No meds AM of procedure Responsible person to drive you home and stay with you for 24 hrs Wash with special soap night before and morning of procedure If on anti-coagulant drug instructions Coumadin- last dose today

## 2021-03-20 NOTE — Progress Notes (Signed)
Remote pacemaker transmission.   

## 2021-03-23 ENCOUNTER — Ambulatory Visit (HOSPITAL_COMMUNITY)
Admission: RE | Disposition: A | Discharge status: Home / Self Care | Payer: Self-pay | Source: Home / Self Care | Attending: Internal Medicine

## 2021-03-23 ENCOUNTER — Ambulatory Visit (HOSPITAL_COMMUNITY)
Admission: RE | Admit: 2021-03-23 | Discharge: 2021-03-23 | Disposition: A | Discharge status: Home / Self Care | Payer: Medicare Other | Attending: Internal Medicine | Admitting: Internal Medicine

## 2021-03-23 ENCOUNTER — Other Ambulatory Visit: Payer: Self-pay

## 2021-03-23 DIAGNOSIS — Z8249 Family history of ischemic heart disease and other diseases of the circulatory system: Secondary | ICD-10-CM | POA: Diagnosis not present

## 2021-03-23 DIAGNOSIS — Z7901 Long term (current) use of anticoagulants: Secondary | ICD-10-CM | POA: Diagnosis not present

## 2021-03-23 DIAGNOSIS — Z45010 Encounter for checking and testing of cardiac pacemaker pulse generator [battery]: Secondary | ICD-10-CM

## 2021-03-23 DIAGNOSIS — Z86718 Personal history of other venous thrombosis and embolism: Secondary | ICD-10-CM | POA: Diagnosis not present

## 2021-03-23 DIAGNOSIS — I10 Essential (primary) hypertension: Secondary | ICD-10-CM | POA: Diagnosis not present

## 2021-03-23 DIAGNOSIS — I480 Paroxysmal atrial fibrillation: Secondary | ICD-10-CM | POA: Diagnosis not present

## 2021-03-23 DIAGNOSIS — E785 Hyperlipidemia, unspecified: Secondary | ICD-10-CM | POA: Diagnosis not present

## 2021-03-23 DIAGNOSIS — I495 Sick sinus syndrome: Secondary | ICD-10-CM | POA: Diagnosis not present

## 2021-03-23 DIAGNOSIS — Z79899 Other long term (current) drug therapy: Secondary | ICD-10-CM | POA: Diagnosis not present

## 2021-03-23 HISTORY — PX: PPM GENERATOR CHANGEOUT: EP1233

## 2021-03-23 LAB — PROTIME-INR
INR: 1.7 — ABNORMAL HIGH (ref 0.8–1.2)
Prothrombin Time: 20.2 seconds — ABNORMAL HIGH (ref 11.4–15.2)

## 2021-03-23 SURGERY — PPM GENERATOR CHANGEOUT
Anesthesia: LOCAL

## 2021-03-23 MED ORDER — SODIUM CHLORIDE 0.9 % IV SOLN
80.0000 mg | INTRAVENOUS | Status: AC
  Administered 2021-03-23: 80 mg

## 2021-03-23 MED ORDER — CEFAZOLIN SODIUM-DEXTROSE 2-4 GM/100ML-% IV SOLN
2.0000 g | INTRAVENOUS | Status: AC
  Administered 2021-03-23: 2 g via INTRAVENOUS

## 2021-03-23 MED ORDER — SODIUM CHLORIDE 0.9 % IV SOLN
INTRAVENOUS | Status: AC
  Filled 2021-03-23: qty 2

## 2021-03-23 MED ORDER — CEFAZOLIN SODIUM-DEXTROSE 2-4 GM/100ML-% IV SOLN
INTRAVENOUS | Status: AC
  Filled 2021-03-23: qty 100

## 2021-03-23 MED ORDER — LIDOCAINE HCL (PF) 1 % IJ SOLN
INTRAMUSCULAR | Status: DC | PRN
  Administered 2021-03-23: 60 mL

## 2021-03-23 MED ORDER — POVIDONE-IODINE 10 % EX SWAB: 2.0000 "application " | Freq: Once | CUTANEOUS | Status: DC

## 2021-03-23 MED ORDER — ACETAMINOPHEN 325 MG PO TABS
325.0000 mg | ORAL_TABLET | ORAL | Status: DC | PRN
  Filled 2021-03-23: qty 2

## 2021-03-23 MED ORDER — CHLORHEXIDINE GLUCONATE 4 % EX LIQD
4.0000 "application " | Freq: Once | CUTANEOUS | Status: DC
  Filled 2021-03-23: qty 60

## 2021-03-23 MED ORDER — LIDOCAINE HCL 1 % IJ SOLN
INTRAMUSCULAR | Status: AC
  Filled 2021-03-23: qty 60

## 2021-03-23 MED ORDER — ONDANSETRON HCL 4 MG/2ML IJ SOLN: 4.0000 mg | Freq: Four times a day (QID) | INTRAMUSCULAR | Status: DC | PRN

## 2021-03-23 MED ORDER — SODIUM CHLORIDE 0.9 % IV SOLN: INTRAVENOUS | Status: DC

## 2021-03-23 SURGICAL SUPPLY — 5 items
CABLE SURGICAL S-101-97-12 (CABLE) ×2 IMPLANT
IPG PACE AZUR XT DR MRI W1DR01 (Pacemaker) ×1 IMPLANT
PACE AZURE XT DR MRI W1DR01 (Pacemaker) ×2 IMPLANT
PAD PRO RADIOLUCENT 2001M-C (PAD) ×2 IMPLANT
TRAY PACEMAKER INSERTION (PACKS) ×2 IMPLANT

## 2021-03-23 NOTE — Discharge Instructions (Signed)
Implantable Cardiac Device Battery Change, Care After  This sheet gives you information about how to care for yourself after your procedure. Your health care provider may also give you more specific instructions. If you have problems or questions, contact your health care provider. What can I expect after the procedure? After your procedure, it is common to have: Pain or soreness at the site where the cardiac device was inserted. Swelling at the site where the cardiac device was inserted. You should received an information card for your new device in 4-8 weeks. Follow these instructions at home: Incision care  Keep the incision clean and dry. Do not take baths, swim, or use a hot tub until after your wound check.  Do not shower for at least 10 days, or as directed by your health care provider. Pat the area dry with a clean towel. Do not rub the area. This may cause bleeding. Follow instructions from your health care provider about how to take care of your incision. Make sure you: Leave stitches (sutures), skin glue, or adhesive strips in place. These skin closures may need to stay in place for 2 weeks or longer. If adhesive strip edges start to loosen and curl up, you may trim the loose edges. Do not remove adhesive strips completely unless your health care provider tells you to do that. Check your incision area every day for signs of infection. Check for: More redness, swelling, or pain. More fluid or blood. Warmth. Pus or a bad smell. Activity Do not lift anything that is heavier than 10 lb (4.5 kg) until your health care provider says it is okay to do so. For the first week, or as long as told by your health care provider: Avoid lifting your affected arm higher than your shoulder. After 1 week, Be gentle when you move your arms over your head. It is okay to raise your arm to comb your hair. Avoid strenuous exercise. Ask your health care provider when it is okay to: Resume your normal  activities. Return to work or school. Resume sexual activity. Eating and drinking Eat a heart-healthy diet. This should include plenty of fresh fruits and vegetables, whole grains, low-fat dairy products, and lean protein like chicken and fish. Limit alcohol intake to no more than 1 drink a day for non-pregnant women and 2 drinks a day for men. One drink equals 12 oz of beer, 5 oz of wine, or 1 oz of hard liquor. Check ingredients and nutrition facts on packaged foods and beverages. Avoid the following types of food: Food that is high in salt (sodium). Food that is high in saturated fat, like full-fat dairy or red meat. Food that is high in trans fat, like fried food. Food and drinks that are high in sugar. Lifestyle Do not use any products that contain nicotine or tobacco, such as cigarettes and e-cigarettes. If you need help quitting, ask your health care provider. Take steps to manage and control your weight. Once cleared, get regular exercise. Aim for 150 minutes of moderate-intensity exercise (such as walking or yoga) or 75 minutes of vigorous exercise (such as running or swimming) each week. Manage other health problems, such as diabetes or high blood pressure. Ask your health care provider how you can manage these conditions. General instructions Do not drive for 24 hours after your procedure if you were given a medicine to help you relax (sedative). Take over-the-counter and prescription medicines only as told by your health care provider. Avoid putting pressure  not drive for 24 hours after your procedure if you were given a medicine to help you relax (sedative).  Take over-the-counter and prescription medicines only as told by your health care provider.  Avoid putting pressure on the area where the cardiac device was placed.  If you need an MRI after your cardiac device has been placed, be sure to tell the health care provider who orders the MRI that you have a cardiac device.  Avoid close and prolonged exposure to electrical devices that have strong magnetic fields. These include: ? Cell phones. Avoid keeping them in a  pocket near the cardiac device, and try using the ear opposite the cardiac device. ? MP3 players. ? Household appliances, like microwaves. ? Metal detectors. ? Electric generators. ? High-tension wires.  Keep all follow-up visits as directed by your health care provider. This is important. Contact a health care provider if:  You have pain at the incision site that is not relieved by over-the-counter or prescription medicines.  You have any of these around your incision site or coming from it: ? More redness, swelling, or pain. ? Fluid or blood. ? Warmth to the touch. ? Pus or a bad smell.  You have a fever.  You feel brief, occasional palpitations, light-headedness, or any symptoms that you think might be related to your heart. Get help right away if:  You experience chest pain that is different from the pain at the cardiac device site.  You develop a red streak that extends above or below the incision site.  You experience shortness of breath.  You have palpitations or an irregular heartbeat.  You have light-headedness that does not go away quickly.  You faint or have dizzy spells.  Your pulse suddenly drops or increases rapidly and does not return to normal.  You begin to gain weight and your legs and ankles swell. Summary  After your procedure, it is common to have pain, soreness, and some swelling where the cardiac device was inserted.  Make sure to keep your incision clean and dry. Follow instructions from your health care provider about how to take care of your incision.  Check your incision every day for signs of infection, such as more pain or swelling, pus or a bad smell, warmth, or leaking fluid and blood.  Avoid strenuous exercise and lifting your left arm higher than your shoulder for 2 weeks, or as long as told by your health care provider. This information is not intended to replace advice given to you by your health care provider. Make sure you discuss  any questions you have with your health care provider.   

## 2021-03-23 NOTE — Progress Notes (Signed)
Pt ambulated without difficulty or bleeding.   Discharged home with his sister-in-law who will drive and stay with pt x 24 hrs.

## 2021-03-23 NOTE — H&P (Signed)
Cardiology Office Note Date:  02/17/2021  Patient ID:  Richard [name] 10/07/1940, MRN [id number] PCP:  Burnard Bunting, MD   Electrophysiologist: Dr. Lovena Le     Chief Complaint:  needs gen change   History of Present Illness: Richard [name] is a 80 y.o. male with history of HTN, HLD, AFib, sinus node dysfunction w/PPM, SVT   He comes today to be seen for dr. Lovena Le, last seen by him in Feb 2022, doing well, no symptoms of his AF or SVT with only 2% burden.  No changes were made.   Device reached RRT 01/18/21   He is ding great. No complaints Golfs, swims regularly without exertional incapacities No CP, palpitations or cardiac awareness No SOB, DOE, will get a little winded with hills. No dizzy spells, near syncope or syncope. 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 where it was done, 4-5 yrs. ago   Hyperlipidemia 06/16/2012   Hypertension     Pacemaker      Medtronic; implanted 06/15/12   PAF (paroxysmal atrial fibrillation) ([place])             Past Surgical History:  Procedure Laterality Date   COLONOSCOPY       INSERT / REPLACE / REMOVE PACEMAKER   06/15/2012    dual chamber   KNEE ARTHROPLASTY   04/19/2012    Procedure: COMPUTER ASSISTED TOTAL KNEE ARTHROPLASTY;  Surgeon: Alta Corning, MD;  Location: [place];  Service: Orthopedics;  Laterality: Right;  GENERAL WITH PRE OP FEMORAL NERVE BLOCK   PERMANENT PACEMAKER INSERTION N/A 06/15/2012    Procedure: PERMANENT PACEMAKER INSERTION;  Surgeon: Sanda Klein, MD;  Location: [place] CATH [facility];  Service: Cardiovascular;   Laterality: N/A;   TONSILLECTOMY       TOTAL HIP ARTHROPLASTY Right 05/21/2016    Procedure: TOTAL HIP ARTHROPLASTY ANTERIOR APPROACH;  Surgeon: Dorna Leitz, MD;  Location: [place];  Service: Orthopedics;  Laterality: Right;   TOTAL KNEE ARTHROPLASTY   04/19/2012    RIGHT KNEE            Current Outpatient Medications  Medication Sig Dispense Refill   atenolol (TENORMIN) 25 MG tablet TAKE 1 TABLET BY MOUTH EVERY DAY 90 tablet 3   Calcium Citrate-Vitamin D (CALCIUM CITRATE + PO) Take 1 tablet by mouth daily.       Multiple Vitamin (MULTIVITAMIN WITH MINERALS) TABS Take 1 tablet by mouth daily.       Omega-3 Fatty Acids (FISH OIL) 300 MG CAPS Take 1 capsule by mouth daily.       simvastatin (ZOCOR) 40 MG tablet Take 1 tablet (40 mg total) by mouth daily. 90 tablet 2   warfarin (COUMADIN) 2.5 MG tablet TAKE 1 TABLET BY MOUTH DAILY OR AS DIRECTED 45 tablet 0    No current facility-administered medications for this visit.      Allergies:   Patient has no known allergies.    Social  History:  The patient  reports that he has never smoked. He has never used smokeless tobacco. He reports current alcohol use. He reports that he does not use drugs.    Family History:  The patient's family history includes Heart disease in his father.   ROS:  Please see the history of present illness.    All other systems are reviewed and otherwise negative.    PHYSICAL EXAM:  VS:  BP 116/70   Pulse 74   Ht 5\' 10"  (1.778 m)   Wt 189 lb 6.4 oz (85.9 kg)   SpO2 96%   BMI 27.18 kg/m  BMI: Body mass index is 27.18 kg/m. Well nourished, well developed, in no acute distress HEENT: normocephalic, atraumatic Neck: no JVD, carotid bruits or masses Cardiac:  RRR; no significant murmurs, no rubs, or gallops Lungs:  CTA b/l, no wheezing, rhonchi or rales Abd: soft, nontender MS: no deformity or atrophy Ext: no edema Skin: warm and dry, no rash Neuro:  No gross deficits appreciated Psych: euthymic mood, full  affect   PPM site is stable, no tethering or discomfort     EKG:  Done today and reviewed by myself shows  AP aced, VS, RBBB   Device interrogation done today and reviewed by myself:  Batter reached RRT 01/18/21 Lead measurements are good Apacing/dependent at 30bpm today   Recent Labs: No results found for requested labs within last 8760 hours.  No results found for requested labs within last 8760 hours.    CrCl cannot be calculated (Patient's most recent lab result is older than the maximum 21 days allowed.).       Wt Readings from Last 3 Encounters:  02/17/21 189 lb 6.4 oz (85.9 kg)  07/08/20 190 lb 9.6 oz (86.5 kg)  07/02/19 192 lb 6.4 oz (87.3 kg)      Other studies reviewed: Additional studies/records reviewed today include: summarized above   ASSESSMENT AND PLAN:   PPM Reached RRT   We reviewed generator change procedure, potential risks/benefits He is agreeable to proceed Will get an echo ahead of time   The patient prefers to wait until early November with some things he would like to do 1st Understands our timeline, can not go into Dec.    Paroxysmal Afib CHA2DS2Vasc 3, on warfarin, monitored and managed by [place] 1.6% % burden Most AMS are quite brief NSVT episodes, available EGMs reviewed are AF.RVR and fleeting   HTN Looks ok   Disposition: F/u with echo/labs, will plan warfarin hold 3 days unless labs deem otherwise, usual post procedure follow up     Current medicines are reviewed at length with the patient today.  The patient did not have any concerns regarding medicines.   Richard Night, PA-C 02/17/2021 9:18 AM     EP Attending  Patient seen and examined. Agree with above. 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 I have reviewed the indications/risks/benefits/ and he wishes to proceed.  Richard Overlie Nyjah Schwake,MD

## 2021-03-24 ENCOUNTER — Encounter (HOSPITAL_COMMUNITY): Payer: Self-pay | Admitting: Internal Medicine

## 2021-03-24 MED FILL — Lidocaine HCl Local Inj 1%: INTRAMUSCULAR | Qty: 60 | Status: AC

## 2021-03-25 ENCOUNTER — Encounter (HOSPITAL_COMMUNITY): Payer: Self-pay | Admitting: Internal Medicine

## 2021-03-26 ENCOUNTER — Other Ambulatory Visit: Payer: Self-pay

## 2021-03-26 ENCOUNTER — Ambulatory Visit (INDEPENDENT_AMBULATORY_CARE_PROVIDER_SITE_OTHER): Payer: Medicare Other

## 2021-03-26 ENCOUNTER — Telehealth: Payer: Self-pay | Admitting: Internal Medicine

## 2021-03-26 DIAGNOSIS — I495 Sick sinus syndrome: Secondary | ICD-10-CM

## 2021-03-26 NOTE — Telephone Encounter (Signed)
Pt is wanting to come in and have his adhesive strips on his would replaced... please advise

## 2021-03-26 NOTE — Telephone Encounter (Signed)
Pt reports he removed the steri-strips.  They came off easily and the wound appears intact.  Spoke with Dr. Lovena Le.  Since it has only been 3 days, we will have pt come in to replace strips.

## 2021-03-26 NOTE — Progress Notes (Signed)
Pt in clinic for replacement of steri-strips.  Pt inadvertantly removed streri strips this morning.  Wound intact, no bleeding.  Since it has only been 3 days since procedure, Dr. Lovena Le ordered to replace strips.  Cleansed either side of incision with alcohol wipe as instructed applied benzoin and strips in usual fashion.  Pt tolerated well.  Pt educated to keep steri-strip dry and intact until wound check next week.

## 2021-03-30 ENCOUNTER — Ambulatory Visit (INDEPENDENT_AMBULATORY_CARE_PROVIDER_SITE_OTHER): Payer: Medicare Other | Admitting: *Deleted

## 2021-03-30 ENCOUNTER — Other Ambulatory Visit: Payer: Self-pay

## 2021-03-30 DIAGNOSIS — I82722 Chronic embolism and thrombosis of deep veins of left upper extremity: Secondary | ICD-10-CM | POA: Diagnosis not present

## 2021-03-30 DIAGNOSIS — Z7901 Long term (current) use of anticoagulants: Secondary | ICD-10-CM

## 2021-03-30 LAB — POCT INR: INR: 1.4 — AB (ref 2.0–3.0)

## 2021-03-30 NOTE — Patient Instructions (Signed)
Description   Take 1.5 tablets of warfarin today and tomorrow take 1 tablet then continue your normal dose of warfarin 1/2 a tablet daily except for 1 tablet on Mondays and Fridays. Recheck INR in 1 week. [facility] [phone]

## 2021-04-02 ENCOUNTER — Other Ambulatory Visit: Payer: Self-pay

## 2021-04-02 ENCOUNTER — Ambulatory Visit (INDEPENDENT_AMBULATORY_CARE_PROVIDER_SITE_OTHER): Payer: Medicare Other

## 2021-04-02 DIAGNOSIS — I495 Sick sinus syndrome: Secondary | ICD-10-CM | POA: Diagnosis not present

## 2021-04-02 LAB — CUP PACEART INCLINIC DEVICE CHECK
Battery Remaining Longevity: 131 mo
Battery Voltage: 3.21 V
Brady Statistic AP VP Percent: 1.58 %
Brady Statistic AP VS Percent: 95.64 %
Brady Statistic AS VP Percent: 0.01 %
Brady Statistic AS VS Percent: 2.78 %
Brady Statistic RA Percent Paced: 93.23 %
Brady Statistic RV Percent Paced: 1.87 %
Date Time Interrogation Session: 20221117100956
Implantable Lead Implant Date: 20140130
Implantable Lead Implant Date: 20140130
Implantable Lead Location: 753859
Implantable Lead Location: 753860
Implantable Pulse Generator Implant Date: 20221107
Lead Channel Impedance Value: 513 Ohm
Lead Channel Impedance Value: 608 Ohm
Lead Channel Impedance Value: 684 Ohm
Lead Channel Impedance Value: 760 Ohm
Lead Channel Pacing Threshold Amplitude: 0.75 V
Lead Channel Pacing Threshold Amplitude: 1.5 V
Lead Channel Pacing Threshold Pulse Width: 0.4 ms
Lead Channel Pacing Threshold Pulse Width: 0.4 ms
Lead Channel Sensing Intrinsic Amplitude: 1.75 mV
Lead Channel Sensing Intrinsic Amplitude: 6.25 mV
Lead Channel Setting Pacing Amplitude: 2 V
Lead Channel Setting Pacing Amplitude: 2.5 V
Lead Channel Setting Pacing Pulse Width: 0.4 ms
Lead Channel Setting Sensing Sensitivity: 0.9 mV

## 2021-04-02 NOTE — Progress Notes (Signed)
Wound check appointment. Steri-strips removed. Wound without redness or edema. Incision edges approximated, wound well healed. Normal device function. Thresholds, R wave sensing, and impedances consistent with implant measurements. Device programmed at appropriate safety margin for chronic leads. Histogram distribution appropriate for patient and level of activity-noted bimodal. AT/AF Burden 3.8%, pt has known history of PAF on [place].  No high ventricular rates noted. Patient educated about wound care, arm mobility, lifting restrictions. Patient is enrolled in remote monitoring, new relay monitor provided/ connected at [facility] today, next scheduled check 06/23/21.  ROV with Dr. Lovena Le on 05/26/21.

## 2021-04-02 NOTE — Patient Instructions (Signed)

## 2021-04-08 ENCOUNTER — Ambulatory Visit (INDEPENDENT_AMBULATORY_CARE_PROVIDER_SITE_OTHER): Payer: Medicare Other

## 2021-04-08 ENCOUNTER — Other Ambulatory Visit: Payer: Self-pay

## 2021-04-08 DIAGNOSIS — Z7901 Long term (current) use of anticoagulants: Secondary | ICD-10-CM

## 2021-04-08 DIAGNOSIS — I82722 Chronic embolism and thrombosis of deep veins of left upper extremity: Secondary | ICD-10-CM

## 2021-04-08 LAB — POCT INR: INR: 3.2 — AB (ref 2.0–3.0)

## 2021-04-08 NOTE — Patient Instructions (Signed)
continue your normal dose of warfarin 1/2 a tablet daily except for 1 tablet on Mondays and Fridays. Recheck INR in 4 week. [facility] [phone].  Eat greens tonight or tomorrow.

## 2021-04-14 DIAGNOSIS — M25562 Pain in left knee: Secondary | ICD-10-CM | POA: Diagnosis not present

## 2021-04-30 DIAGNOSIS — M25552 Pain in left hip: Secondary | ICD-10-CM | POA: Diagnosis not present

## 2021-04-30 DIAGNOSIS — M25561 Pain in right knee: Secondary | ICD-10-CM | POA: Diagnosis not present

## 2021-04-30 DIAGNOSIS — M25462 Effusion, left knee: Secondary | ICD-10-CM | POA: Diagnosis not present

## 2021-05-05 ENCOUNTER — Other Ambulatory Visit: Payer: Self-pay

## 2021-05-05 ENCOUNTER — Ambulatory Visit (INDEPENDENT_AMBULATORY_CARE_PROVIDER_SITE_OTHER): Payer: Medicare Other | Admitting: *Deleted

## 2021-05-05 DIAGNOSIS — Z7901 Long term (current) use of anticoagulants: Secondary | ICD-10-CM

## 2021-05-05 DIAGNOSIS — I82722 Chronic embolism and thrombosis of deep veins of left upper extremity: Secondary | ICD-10-CM | POA: Diagnosis not present

## 2021-05-05 DIAGNOSIS — Z5181 Encounter for therapeutic drug level monitoring: Secondary | ICD-10-CM

## 2021-05-05 LAB — POCT INR: INR: 2.9 (ref 2.0–3.0)

## 2021-05-05 NOTE — Patient Instructions (Signed)
Description   continue your normal dose of warfarin 1/2 a tablet daily except for 1 tablet on Mondays and Fridays. Recheck INR in 5 week. [facility] [phone].

## 2021-05-21 DIAGNOSIS — Z125 Encounter for screening for malignant neoplasm of prostate: Secondary | ICD-10-CM | POA: Diagnosis not present

## 2021-05-21 DIAGNOSIS — R7989 Other specified abnormal findings of blood chemistry: Secondary | ICD-10-CM | POA: Diagnosis not present

## 2021-05-21 DIAGNOSIS — E785 Hyperlipidemia, unspecified: Secondary | ICD-10-CM | POA: Diagnosis not present

## 2021-05-21 DIAGNOSIS — Z79899 Other long term (current) drug therapy: Secondary | ICD-10-CM | POA: Diagnosis not present

## 2021-05-25 DIAGNOSIS — Z0000 Encounter for general adult medical examination without abnormal findings: Secondary | ICD-10-CM | POA: Diagnosis not present

## 2021-05-25 DIAGNOSIS — E663 Overweight: Secondary | ICD-10-CM | POA: Diagnosis not present

## 2021-05-25 DIAGNOSIS — R82998 Other abnormal findings in urine: Secondary | ICD-10-CM | POA: Diagnosis not present

## 2021-05-25 DIAGNOSIS — E785 Hyperlipidemia, unspecified: Secondary | ICD-10-CM | POA: Diagnosis not present

## 2021-05-25 DIAGNOSIS — Z1331 Encounter for screening for depression: Secondary | ICD-10-CM | POA: Diagnosis not present

## 2021-05-25 DIAGNOSIS — Z7901 Long term (current) use of anticoagulants: Secondary | ICD-10-CM | POA: Diagnosis not present

## 2021-05-25 DIAGNOSIS — Z1339 Encounter for screening examination for other mental health and behavioral disorders: Secondary | ICD-10-CM | POA: Diagnosis not present

## 2021-05-25 DIAGNOSIS — I4891 Unspecified atrial fibrillation: Secondary | ICD-10-CM | POA: Diagnosis not present

## 2021-05-25 DIAGNOSIS — Z1212 Encounter for screening for malignant neoplasm of rectum: Secondary | ICD-10-CM | POA: Diagnosis not present

## 2021-05-25 DIAGNOSIS — Z950 Presence of cardiac pacemaker: Secondary | ICD-10-CM | POA: Diagnosis not present

## 2021-05-25 DIAGNOSIS — R030 Elevated blood-pressure reading, without diagnosis of hypertension: Secondary | ICD-10-CM | POA: Diagnosis not present

## 2021-05-26 ENCOUNTER — Other Ambulatory Visit: Payer: Self-pay

## 2021-05-26 ENCOUNTER — Ambulatory Visit (INDEPENDENT_AMBULATORY_CARE_PROVIDER_SITE_OTHER): Payer: Medicare Other | Admitting: Internal Medicine

## 2021-05-26 ENCOUNTER — Encounter: Payer: Self-pay | Admitting: Internal Medicine

## 2021-05-26 VITALS — BP 132/70 | HR 76 | Ht 71.0 in | Wt 193.0 lb

## 2021-05-26 DIAGNOSIS — I495 Sick sinus syndrome: Secondary | ICD-10-CM

## 2021-05-26 DIAGNOSIS — Z950 Presence of cardiac pacemaker: Secondary | ICD-10-CM | POA: Diagnosis not present

## 2021-05-26 DIAGNOSIS — I10 Essential (primary) hypertension: Secondary | ICD-10-CM

## 2021-05-26 DIAGNOSIS — I471 Supraventricular tachycardia: Secondary | ICD-10-CM

## 2021-05-26 DIAGNOSIS — I480 Paroxysmal atrial fibrillation: Secondary | ICD-10-CM | POA: Diagnosis not present

## 2021-05-26 NOTE — Progress Notes (Signed)
HPI [name] returns today for followup. He is a pleasant 81 yo man with a h/o HTN, sinus node dysfunction and PAF on warfarin. He has done well in the interim. He has not had chest pain or sob and remains active. He has not had chest pain or sob. No syncope. He underwent PM gen change out in September. He has gone back to swimming and he stays active.  No Known Allergies   Current Outpatient Medications  Medication Sig Dispense Refill   atenolol (TENORMIN) 25 MG tablet TAKE 1 TABLET BY MOUTH EVERY DAY 90 tablet 3   Calcium Citrate-Vitamin D (CALCIUM CITRATE + PO) Take 1 tablet by mouth daily.     Multiple Vitamin (MULTIVITAMIN WITH MINERALS) TABS Take 1 tablet by mouth daily.     naproxen sodium (ALEVE) 220 MG tablet Take 440 mg by mouth daily as needed (pain).     Omega-3 Fatty Acids (FISH OIL) 1200 MG CAPS Take 1,200 mg by mouth daily.     simvastatin (ZOCOR) 40 MG tablet Take 1 tablet (40 mg total) by mouth daily. 90 tablet 2   warfarin (COUMADIN) 2.5 MG tablet TAKE 1/2 TABLET BY MOUTH DAILY EXCEPT 1 TABLET ON MONDAY AND FRIDAY OR AS DIRECTED. 60 tablet 0   No current facility-administered medications for this visit.     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 where it was done, 4-5 yrs. ago   Hyperlipidemia 06/16/2012   Hypertension    Pacemaker    Medtronic; implanted 06/15/12   PAF (paroxysmal atrial fibrillation) (HCC)     ROS:   All systems reviewed and negative except as noted in the HPI.   Past Surgical History:  Procedure Laterality Date   COLONOSCOPY     INSERT / REPLACE / REMOVE PACEMAKER  06/15/2012   dual chamber   KNEE ARTHROPLASTY  04/19/2012   Procedure: COMPUTER ASSISTED  TOTAL KNEE ARTHROPLASTY;  Surgeon: Alta Corning, MD;  Location: [place];  Service: Orthopedics;  Laterality: Right;  GENERAL WITH PRE OP FEMORAL NERVE BLOCK   PERMANENT PACEMAKER INSERTION N/A 06/15/2012   Procedure: PERMANENT PACEMAKER INSERTION;  Surgeon: Sanda Klein, MD;  Location: [place] [place] CATH LAB;  Service: Cardiovascular;  Laterality: N/A;   PPM GENERATOR CHANGEOUT N/A 03/23/2021   Procedure: PPM GENERATOR CHANGEOUT;  Surgeon: Evans Lance, MD;  Location: [place] CV LAB;  Service: Cardiovascular;  Laterality: N/A;   TONSILLECTOMY     TOTAL HIP ARTHROPLASTY Right 05/21/2016   Procedure: TOTAL HIP ARTHROPLASTY ANTERIOR APPROACH;  Surgeon: Dorna Leitz, MD;  Location: [place];  Service: Orthopedics;  Laterality: Right;   TOTAL KNEE ARTHROPLASTY  04/19/2012   RIGHT KNEE     Family History  Problem Relation Age of Onset   Heart disease Father    Colon cancer Neg Hx    Esophageal cancer Neg Hx    Rectal cancer Neg Hx    Stomach cancer Neg Hx      Social History   Socioeconomic History   Marital status: Married    Spouse name: Not on file   Number of children: 2  Years of education: Not on file   Highest education level: Not on file  Occupational History   Occupation: retired  Tobacco Use   Smoking status: Never   Smokeless tobacco: Never  Vaping Use   Vaping Use: Never used  Substance and Sexual Activity   Alcohol use: Yes    Comment: week- 1-2 drinks, 1-2 beers    Drug use: No   Sexual activity: Not on file  Other Topics Concern   Not on file  Social History Narrative   Not on file   Social Determinants of Health   Financial Resource Strain: Not on file  Food Insecurity: Not on file  Transportation Needs: Not on file  Physical Activity: Not on file  Stress: Not on file  Social Connections: Not on file  Intimate Partner Violence: Not on file     BP 132/70    Pulse 76    Ht 5\' 11"  (1.803 m)    Wt 193 lb (87.5 kg)    SpO2 96%    BMI 26.92 kg/m   Physical  Exam:  Well appearing NAD HEENT: Unremarkable Neck:  No JVD, no thyromegally Lymphatics:  No adenopathy Back:  No CVA tenderness Lungs:  Clear with no wheezes. Well healed PPM incision HEART:  Regular rate rhythm, no murmurs, no rubs, no clicks Abd:  soft, positive bowel sounds, no organomegally, no rebound, no guarding Ext:  2 plus pulses, no edema, no cyanosis, no clubbing Skin:  No rashes no nodules Neuro:  CN II through XII intact, motor grossly intact  EKG - NSR with first degree AV block and RBBB  DEVICE  Normal device function.  See PaceArt for details.   Assess/Plan:  SVT - this has been quiet based on PM interrogation. He will undergo watchful waiting Atrial fib - his burden has picked up a bit but he remains in NSR about 97% of the time.  Sinus node dysfunction - he is asymptomatic pacing about 94% in the atrium HTN - his bp is controlled. No change in his meds. Carleene Overlie Agnes Brightbill,MD

## 2021-05-26 NOTE — Patient Instructions (Signed)
Medication Instructions:  °Your physician recommends that you continue on your current medications as directed. Please refer to the Current Medication list given to you today. ° °Labwork: °None ordered. ° °Testing/Procedures: °None ordered. ° °Follow-Up: °Your physician wants you to follow-up in: one year with Gregg Taylor, MD or one of the following Advanced Practice Providers on your designated Care Team:   °Renee Ursuy, PA-C °Michael "Andy" Tillery, PA-C ° °Remote monitoring is used to monitor your Pacemaker from home. This monitoring reduces the number of office visits required to check your device to one time per year. It allows us to keep an eye on the functioning of your device to ensure it is working properly. You are scheduled for a device check from home on 06/23/2021. You may send your transmission at any time that day. If you have a wireless device, the transmission will be sent automatically. After your physician reviews your transmission, you will receive a postcard with your next transmission date. ° °Any Other Special Instructions Will Be Listed Below (If Applicable). ° °If you need a refill on your cardiac medications before your next appointment, please call your pharmacy.  ° ° ° ° °

## 2021-06-09 ENCOUNTER — Other Ambulatory Visit: Payer: Self-pay

## 2021-06-09 ENCOUNTER — Ambulatory Visit (INDEPENDENT_AMBULATORY_CARE_PROVIDER_SITE_OTHER): Payer: Medicare Other | Admitting: *Deleted

## 2021-06-09 DIAGNOSIS — Z7901 Long term (current) use of anticoagulants: Secondary | ICD-10-CM

## 2021-06-09 DIAGNOSIS — Z5181 Encounter for therapeutic drug level monitoring: Secondary | ICD-10-CM | POA: Diagnosis not present

## 2021-06-09 DIAGNOSIS — I82722 Chronic embolism and thrombosis of deep veins of left upper extremity: Secondary | ICD-10-CM | POA: Diagnosis not present

## 2021-06-09 LAB — POCT INR: INR: 4 — AB (ref 2.0–3.0)

## 2021-06-09 NOTE — Patient Instructions (Signed)
Description   Hold warfarin today, then continue your normal dose of warfarin 1/2 a tablet daily except for 1 tablet on Mondays and Fridays. Eat a serving of greens today. Recheck INR in 2 weeks. [facility] [phone].

## 2021-06-14 ENCOUNTER — Other Ambulatory Visit: Payer: Self-pay | Admitting: Cardiovascular Disease

## 2021-06-23 ENCOUNTER — Other Ambulatory Visit: Payer: Self-pay

## 2021-06-23 ENCOUNTER — Ambulatory Visit (INDEPENDENT_AMBULATORY_CARE_PROVIDER_SITE_OTHER): Payer: Medicare Other

## 2021-06-23 ENCOUNTER — Ambulatory Visit (INDEPENDENT_AMBULATORY_CARE_PROVIDER_SITE_OTHER): Payer: Medicare Other | Admitting: *Deleted

## 2021-06-23 DIAGNOSIS — Z5181 Encounter for therapeutic drug level monitoring: Secondary | ICD-10-CM | POA: Diagnosis not present

## 2021-06-23 DIAGNOSIS — I495 Sick sinus syndrome: Secondary | ICD-10-CM | POA: Diagnosis not present

## 2021-06-23 DIAGNOSIS — I82722 Chronic embolism and thrombosis of deep veins of left upper extremity: Secondary | ICD-10-CM | POA: Diagnosis not present

## 2021-06-23 DIAGNOSIS — Z7901 Long term (current) use of anticoagulants: Secondary | ICD-10-CM

## 2021-06-23 LAB — POCT INR: INR: 2.6 (ref 2.0–3.0)

## 2021-06-23 LAB — CUP PACEART REMOTE DEVICE CHECK
Battery Remaining Longevity: 127 mo
Battery Voltage: 3.18 V
Brady Statistic AP VP Percent: 4.78 %
Brady Statistic AP VS Percent: 92.36 %
Brady Statistic AS VP Percent: 0.03 %
Brady Statistic AS VS Percent: 2.84 %
Brady Statistic RA Percent Paced: 94.39 %
Brady Statistic RV Percent Paced: 4.91 %
Date Time Interrogation Session: 20230207004116
Implantable Lead Implant Date: 20140130
Implantable Lead Implant Date: 20140130
Implantable Lead Location: 753859
Implantable Lead Location: 753860
Implantable Pulse Generator Implant Date: 20221107
Lead Channel Impedance Value: 513 Ohm
Lead Channel Impedance Value: 627 Ohm
Lead Channel Impedance Value: 684 Ohm
Lead Channel Impedance Value: 741 Ohm
Lead Channel Pacing Threshold Amplitude: 0.625 V
Lead Channel Pacing Threshold Amplitude: 1.5 V
Lead Channel Pacing Threshold Pulse Width: 0.4 ms
Lead Channel Pacing Threshold Pulse Width: 0.4 ms
Lead Channel Sensing Intrinsic Amplitude: 2.25 mV
Lead Channel Sensing Intrinsic Amplitude: 2.25 mV
Lead Channel Sensing Intrinsic Amplitude: 6.625 mV
Lead Channel Sensing Intrinsic Amplitude: 6.625 mV
Lead Channel Setting Pacing Amplitude: 2 V
Lead Channel Setting Pacing Amplitude: 2.5 V
Lead Channel Setting Pacing Pulse Width: 0.4 ms
Lead Channel Setting Sensing Sensitivity: 0.9 mV

## 2021-06-23 NOTE — Patient Instructions (Signed)
Description   Continue your normal dose of warfarin 1/2 a tablet daily except for 1 tablet on Mondays and Fridays.Recheck INR in  3 weeks. [facility] [phone].

## 2021-06-26 NOTE — Progress Notes (Signed)
Remote pacemaker transmission.   

## 2021-07-14 ENCOUNTER — Ambulatory Visit (INDEPENDENT_AMBULATORY_CARE_PROVIDER_SITE_OTHER): Payer: Medicare Other | Admitting: *Deleted

## 2021-07-14 ENCOUNTER — Other Ambulatory Visit: Payer: Self-pay

## 2021-07-14 DIAGNOSIS — Z7901 Long term (current) use of anticoagulants: Secondary | ICD-10-CM

## 2021-07-14 DIAGNOSIS — Z5181 Encounter for therapeutic drug level monitoring: Secondary | ICD-10-CM

## 2021-07-14 DIAGNOSIS — I82722 Chronic embolism and thrombosis of deep veins of left upper extremity: Secondary | ICD-10-CM | POA: Diagnosis not present

## 2021-07-14 LAB — POCT INR: INR: 2.7 (ref 2.0–3.0)

## 2021-07-14 NOTE — Patient Instructions (Signed)
Description   Continue your normal dose of warfarin 1/2 a tablet daily except for 1 tablet on Mondays and Fridays.Recheck INR in  4 weeks. [facility] [phone].

## 2021-07-20 DIAGNOSIS — X32XXXD Exposure to sunlight, subsequent encounter: Secondary | ICD-10-CM | POA: Diagnosis not present

## 2021-07-20 DIAGNOSIS — L570 Actinic keratosis: Secondary | ICD-10-CM | POA: Diagnosis not present

## 2021-07-20 DIAGNOSIS — C44629 Squamous cell carcinoma of skin of left upper limb, including shoulder: Secondary | ICD-10-CM | POA: Diagnosis not present

## 2021-07-20 DIAGNOSIS — D0462 Carcinoma in situ of skin of left upper limb, including shoulder: Secondary | ICD-10-CM | POA: Diagnosis not present

## 2021-07-25 ENCOUNTER — Other Ambulatory Visit: Payer: Self-pay | Admitting: Internal Medicine

## 2021-08-11 ENCOUNTER — Other Ambulatory Visit: Payer: Self-pay

## 2021-08-11 ENCOUNTER — Ambulatory Visit (INDEPENDENT_AMBULATORY_CARE_PROVIDER_SITE_OTHER): Payer: Medicare Other | Admitting: *Deleted

## 2021-08-11 DIAGNOSIS — I82722 Chronic embolism and thrombosis of deep veins of left upper extremity: Secondary | ICD-10-CM

## 2021-08-11 DIAGNOSIS — Z7901 Long term (current) use of anticoagulants: Secondary | ICD-10-CM

## 2021-08-11 DIAGNOSIS — Z5181 Encounter for therapeutic drug level monitoring: Secondary | ICD-10-CM

## 2021-08-11 LAB — POCT INR: INR: 3.5 — AB (ref 2.0–3.0)

## 2021-08-11 NOTE — Patient Instructions (Signed)
Description   ? Hold warfarin today and then continue your normal dose of warfarin 1/2 a tablet daily except for 1 tablet on Mondays and Fridays.Recheck INR in  3 weeks. [facility] [phone].   ?  ? ? ?

## 2021-08-17 DIAGNOSIS — Z08 Encounter for follow-up examination after completed treatment for malignant neoplasm: Secondary | ICD-10-CM | POA: Diagnosis not present

## 2021-08-17 DIAGNOSIS — Z85828 Personal history of other malignant neoplasm of skin: Secondary | ICD-10-CM | POA: Diagnosis not present

## 2021-08-28 ENCOUNTER — Ambulatory Visit (INDEPENDENT_AMBULATORY_CARE_PROVIDER_SITE_OTHER): Payer: Medicare Other

## 2021-08-28 DIAGNOSIS — I82722 Chronic embolism and thrombosis of deep veins of left upper extremity: Secondary | ICD-10-CM | POA: Diagnosis not present

## 2021-08-28 DIAGNOSIS — Z7901 Long term (current) use of anticoagulants: Secondary | ICD-10-CM | POA: Diagnosis not present

## 2021-08-28 LAB — POCT INR: INR: 2.3 (ref 2.0–3.0)

## 2021-08-28 IMAGING — MR MR KNEE*L* W/O CM
4 of 7 series · 19 of 40 positions shown · non-contrast
Comparison: None.

CLINICAL DATA: Several week history of knee pain.

EXAM:
MRI OF THE LEFT KNEE WITHOUT CONTRAST
TECHNIQUE: Multiplanar, multisequence MR imaging of the knee was performed. No
intravenous contrast was administered.

[Series 2: T2 fat-sat · axial · 4.0mm · 0.31mm/px · z∈[-53,+37]mm · 3 of 23 slices shown]
[im 5/23]
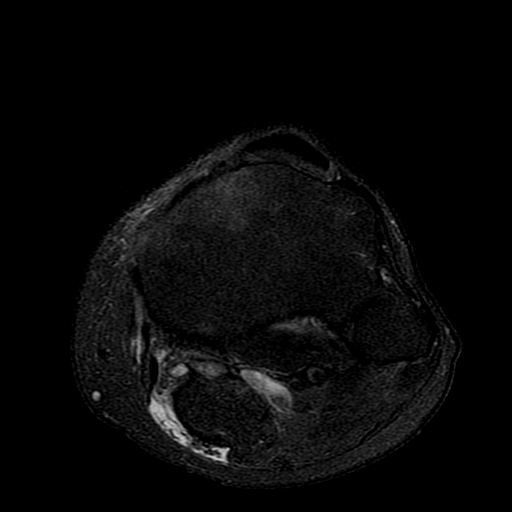
[im 14/23]
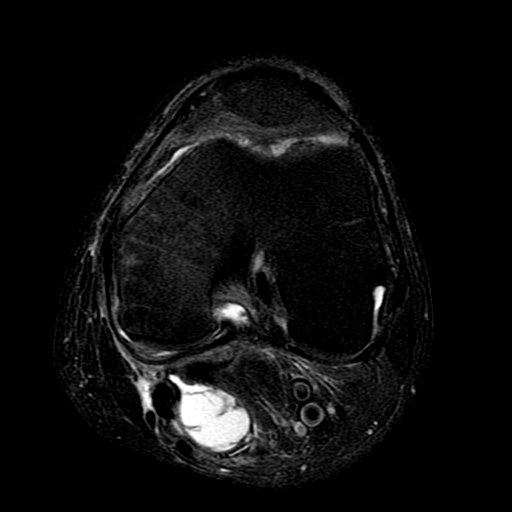
[im 23/23]
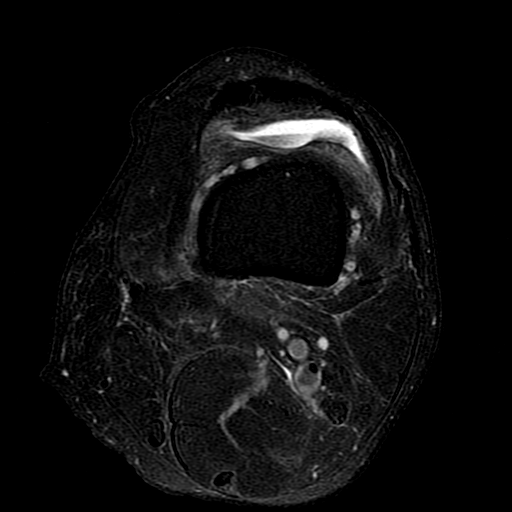

[Series 5: PD fat-sat · coronal · 4.0mm · 0.29mm/px · 6 of 23 slices shown (1 of 2)]
[im 1/23]
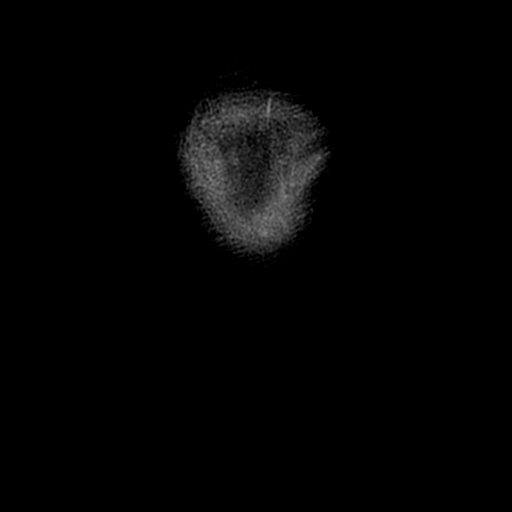
[im 5/23]
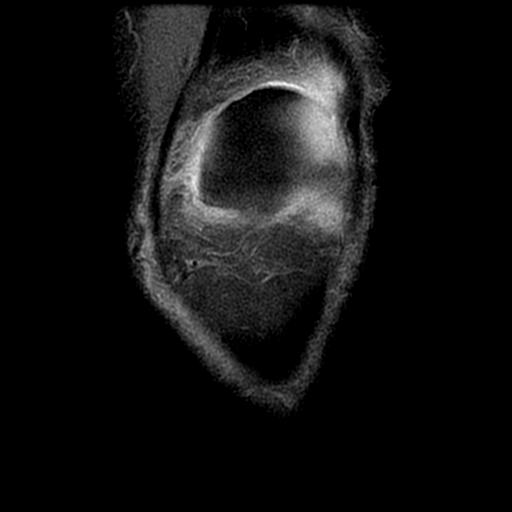
[im 9/23]
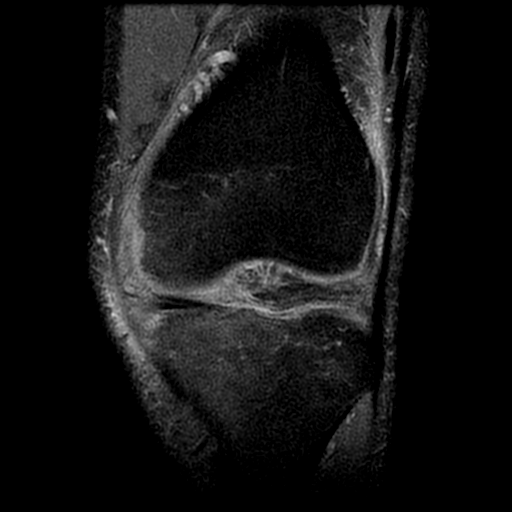
[im 14/23]
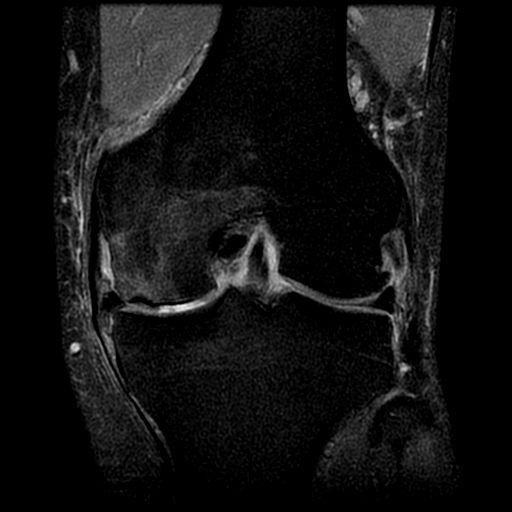
[im 18/23]
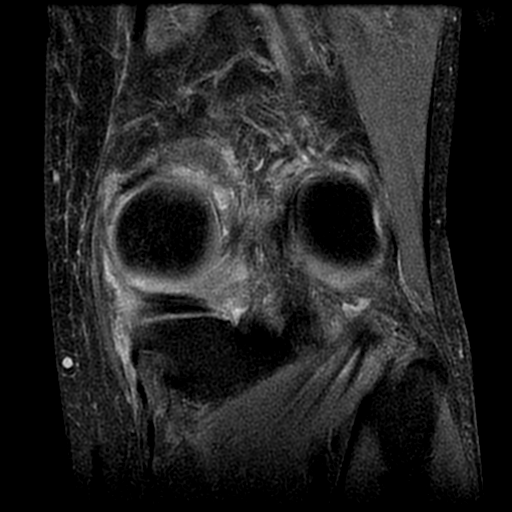
[im 23/23]
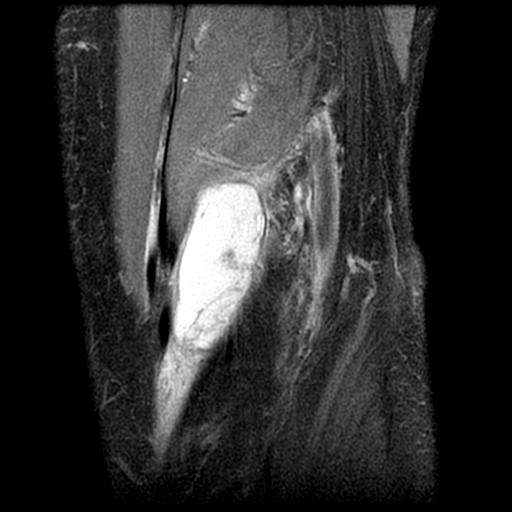

[Series 6: PD fat-sat · sagittal · 3.0mm · 0.29mm/px · 6 of 24 slices shown (2 of 2)]
[im 1/24]
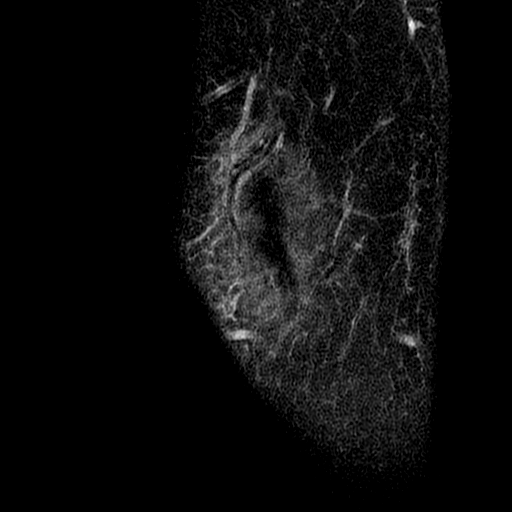
[im 5/24]
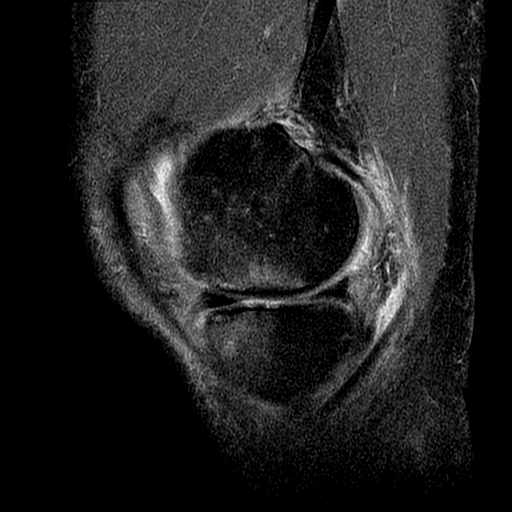
[im 10/24]
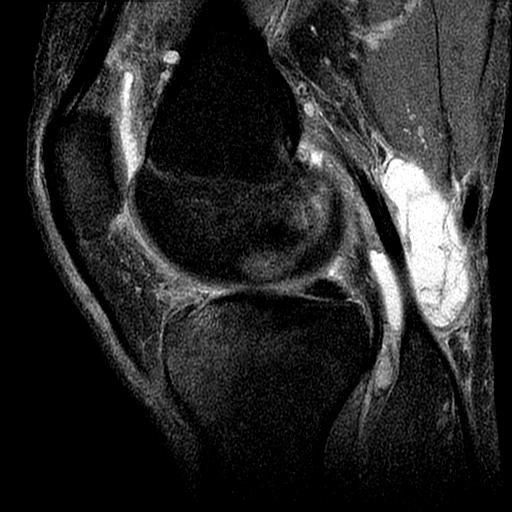
[im 14/24]
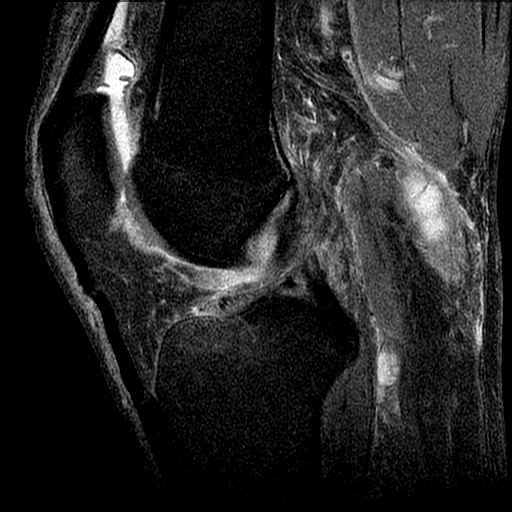
[im 19/24]
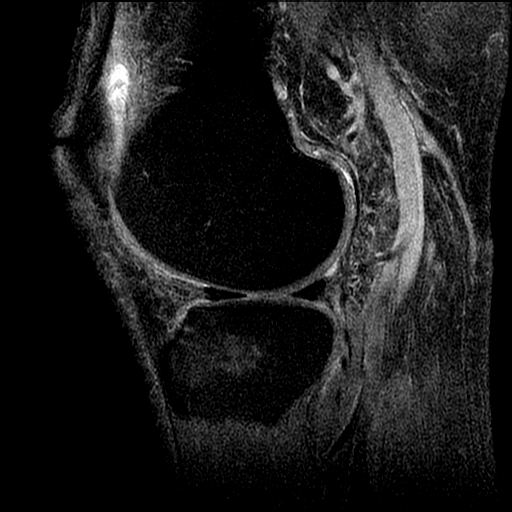
[im 24/24]
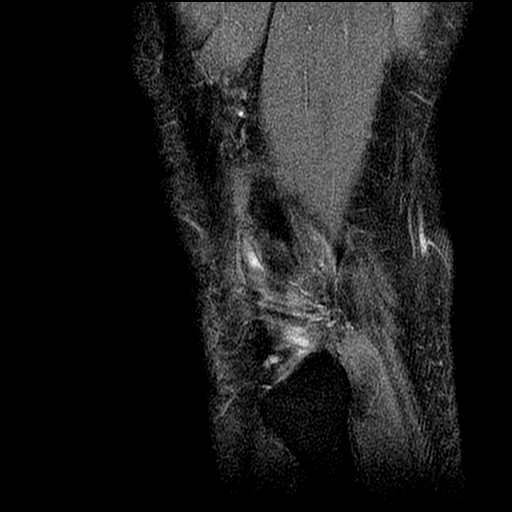

[Series 8: PD · coronal · 2.0mm · 0.29mm/px · 4 of 17 slices shown]
[im 1/17]
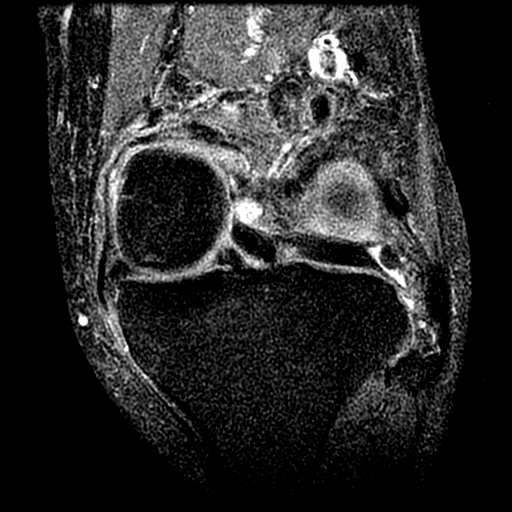
[im 6/17]
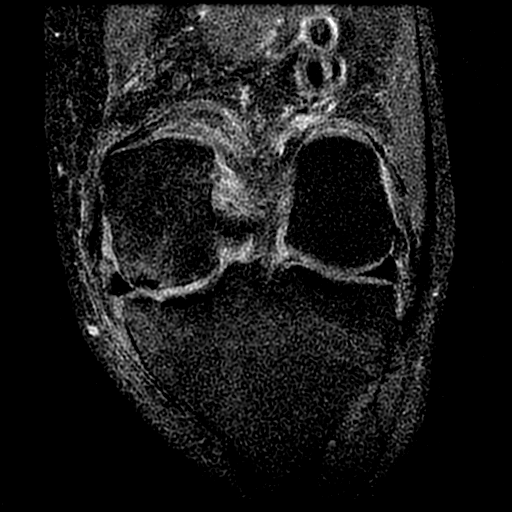
[im 11/17]
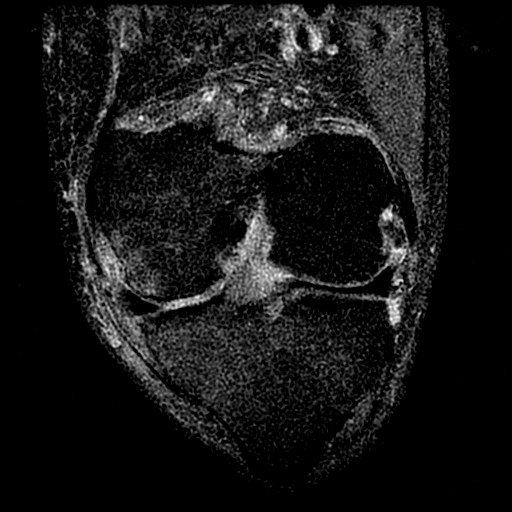
[im 17/17]
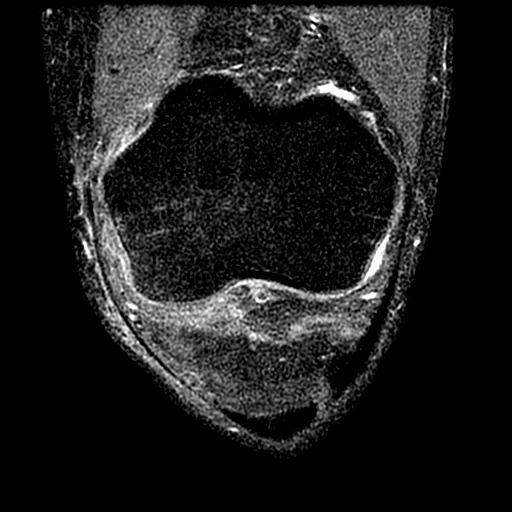

[19 of 40 positions shown; findings below may reference images not displayed]

FINDINGS: MENISCI

Medial meniscus: Suspect shallow inferior articular surface flap
type tear involving the posterior horn mid body junction region.

Lateral meniscus:  Intact

LIGAMENTS

Cruciates:  Intact

Collaterals:  Intact. Mild MCL and pes anserine bursitis.

CARTILAGE

Patellofemoral:  Normal

Medial: Advanced degenerative chondrosis with areas of full or near
full-thickness cartilage loss and mild joint space narrowing. There
is also a subchondral stress fracture or focus of spontaneous
osteonecrosis involving the medial femoral condyle.

Lateral:  Mild degenerative chondrosis.

Joint:  Small joint effusion and mild synovitis.

Popliteal Fossa:  Moderate to large leaking Baker's cyst.

Extensor Mechanism: The patella retinacular structures are intact
and the quadriceps and patellar tendons are intact

Bones: . small subchondral stress fracture versus focus of
spontaneous osteonecrosis involving the medial femoral condyle
medially with moderate surrounding marrow edema.

Other: Normal knee musculature.
IMPRESSION: 1. Suspect shallow inferior articular surface flap type tear
involving the posterior horn mid body junction region of the medial
meniscus.
2. Subchondral stress fracture versus focus of spontaneous
osteonecrosis involving the medial femoral condyle medially.
3. Intact ligamentous structures.
4. Small joint effusion and mild synovitis. Moderate to large
leaking Baker's cyst.

## 2021-08-28 NOTE — Patient Instructions (Signed)
Continue 1/2 a tablet daily except for 1 tablet on Mondays and Fridays.Recheck INR in  6 weeks. [facility] [phone].   ?

## 2021-08-31 DIAGNOSIS — H26493 Other secondary cataract, bilateral: Secondary | ICD-10-CM | POA: Diagnosis not present

## 2021-08-31 DIAGNOSIS — H524 Presbyopia: Secondary | ICD-10-CM | POA: Diagnosis not present

## 2021-08-31 DIAGNOSIS — H35371 Puckering of macula, right eye: Secondary | ICD-10-CM | POA: Diagnosis not present

## 2021-08-31 DIAGNOSIS — H43813 Vitreous degeneration, bilateral: Secondary | ICD-10-CM | POA: Diagnosis not present

## 2021-09-10 DIAGNOSIS — M5451 Vertebrogenic low back pain: Secondary | ICD-10-CM | POA: Diagnosis not present

## 2021-09-21 DIAGNOSIS — M5416 Radiculopathy, lumbar region: Secondary | ICD-10-CM | POA: Diagnosis not present

## 2021-09-22 ENCOUNTER — Ambulatory Visit (INDEPENDENT_AMBULATORY_CARE_PROVIDER_SITE_OTHER): Payer: Medicare Other

## 2021-09-22 DIAGNOSIS — I495 Sick sinus syndrome: Secondary | ICD-10-CM | POA: Diagnosis not present

## 2021-09-22 LAB — CUP PACEART REMOTE DEVICE CHECK
Battery Remaining Longevity: 124 mo
Battery Voltage: 3.11 V
Brady Statistic AP VP Percent: 3.58 %
Brady Statistic AP VS Percent: 90.08 %
Brady Statistic AS VP Percent: 0.06 %
Brady Statistic AS VS Percent: 6.28 %
Brady Statistic RA Percent Paced: 89.98 %
Brady Statistic RV Percent Paced: 3.91 %
Date Time Interrogation Session: 20230508192747
Implantable Lead Implant Date: 20140130
Implantable Lead Implant Date: 20140130
Implantable Lead Location: 753859
Implantable Lead Location: 753860
Implantable Pulse Generator Implant Date: 20221107
Lead Channel Impedance Value: 456 Ohm
Lead Channel Impedance Value: 551 Ohm
Lead Channel Impedance Value: 703 Ohm
Lead Channel Impedance Value: 760 Ohm
Lead Channel Pacing Threshold Amplitude: 0.625 V
Lead Channel Pacing Threshold Amplitude: 1.25 V
Lead Channel Pacing Threshold Pulse Width: 0.4 ms
Lead Channel Pacing Threshold Pulse Width: 0.4 ms
Lead Channel Sensing Intrinsic Amplitude: 3.875 mV
Lead Channel Sensing Intrinsic Amplitude: 3.875 mV
Lead Channel Sensing Intrinsic Amplitude: 5.5 mV
Lead Channel Sensing Intrinsic Amplitude: 5.5 mV
Lead Channel Setting Pacing Amplitude: 2 V
Lead Channel Setting Pacing Amplitude: 2.5 V
Lead Channel Setting Pacing Pulse Width: 0.4 ms
Lead Channel Setting Sensing Sensitivity: 0.9 mV

## 2021-09-24 ENCOUNTER — Telehealth: Payer: Self-pay | Admitting: Cardiovascular Disease

## 2021-09-24 DIAGNOSIS — M5416 Radiculopathy, lumbar region: Secondary | ICD-10-CM | POA: Diagnosis not present

## 2021-09-24 NOTE — Telephone Encounter (Signed)
?*  STAT* If patient is at the pharmacy, call can be transferred to refill team. ? ? ?1. Which medications need to be refilled? (please list name of each medication and dose if known) Warfarin ? ?2. Which pharmacy/location (including street and city if local pharmacy) is medication to be sent to? [place], [place] ? ?3. Do they need a 30 day or 90 day supply? 90 days and refills ? ?

## 2021-09-28 ENCOUNTER — Telehealth: Payer: Self-pay | Admitting: Internal Medicine

## 2021-09-28 DIAGNOSIS — I480 Paroxysmal atrial fibrillation: Secondary | ICD-10-CM

## 2021-09-28 DIAGNOSIS — M5416 Radiculopathy, lumbar region: Secondary | ICD-10-CM | POA: Diagnosis not present

## 2021-09-28 MED ORDER — WARFARIN SODIUM 2.5 MG PO TABS: ORAL_TABLET | ORAL | 0 refills | Status: DC

## 2021-09-28 NOTE — Telephone Encounter (Signed)
Prescription refill request received for warfarin ?Lov: 05/26/21 [name] ?Next INR check: 10/09/21 ?Warfarin tablet strength: 2.'5mg'$  ? ?Called pt because a refill was sent by Frederik Schmidt, RN on 09/24/21. Pt stated he picked up his medication on 09/24/21; however, he lost the medication and can't find it. New refill sent to requested pharmacy.  ?

## 2021-09-28 NOTE — Telephone Encounter (Signed)
?*  STAT* If patient is at the pharmacy, call can be transferred to refill team. ? ? ?1. Which medications need to be refilled? (please list name of each medication and dose if known) warfarin (COUMADIN) 2.5 MG tablet ? ?2. Which pharmacy/location (including street and city if local pharmacy) is medication to be sent to? [id number] - [place], [address] AT [address] ? ?3. Do they need a 30 day or 90 day supply? 90 ? ?

## 2021-09-30 ENCOUNTER — Telehealth: Payer: Self-pay

## 2021-09-30 DIAGNOSIS — M5416 Radiculopathy, lumbar region: Secondary | ICD-10-CM | POA: Diagnosis not present

## 2021-09-30 NOTE — Telephone Encounter (Signed)
This pt is scheduled to see AT on 5/24 but was last seen by GT in Jan. And was told to f/u in 1 year. I called pt to get more information since there wasn't a phone note stating why he needed to be seen.  ? ?Pt stated he called in for an appt because he has been feeling dizzy and having palpitations. He thinks he may be in A-fib. He said it's worse than usual. He described it as feeling how he does when he gets his device checked and they are "messing with my device" ? ?I was going to bring him in earlier but there are no earlier appts.  ? ?He was advised to go to the ED if his symptoms worsen. I also suggested that he get a BP machine and monitor this at home as he doesn't have a way to that right now.  ?

## 2021-10-01 ENCOUNTER — Telehealth: Payer: Self-pay

## 2021-10-01 NOTE — Telephone Encounter (Signed)
Transmission received 10/01/2021.

## 2021-10-01 NOTE — Telephone Encounter (Signed)
-----   Message from Shirley Friar, PA-C sent at 09/30/2021  5:06 PM EDT ----- Regarding: RE: Thank you! He can also send a transmission to see if we can see what's going on in the mean time ----- Message ----- From: Carylon Perches, CMA Sent: 09/30/2021   4:47 PM EDT To: Shirley Friar, PA-C  I called this pt and he said he called in for an appt because he has been feeling dizzy and having palpitations. He thinks he may be in A-fib. He said it's worse than usual. He described it as feeling how he does when he gets his device checked and they are "messing with my device"  I was going to bring him in earlier but there are no earlier appts.    ----- Message ----- From: Shirley Friar, Hershal Coria Sent: 09/30/2021  10:11 AM EDT To: April Garrison, Helen   Can you please see if this patient is having issues?  Saw Dr. Lovena Le 05/2021 and recommended yearly follow up but on our schedule next week as an "Office Visit" for "Pacemaker".   Legrand Como [address]" [place], PA-C  09/30/2021 10:11 AM

## 2021-10-01 NOTE — Telephone Encounter (Signed)
Successful telephone encounter to patient to request manual transmission per AT, PA to assess for ongoing AF. Will review and alert patient/provider with any abnormalities.

## 2021-10-01 NOTE — Telephone Encounter (Signed)
Patient states he shipped back the monitor he thought he had back to [employer].  He has a different type of monitor now, he needs to knows what method he needs to use to send a transmission to [place].

## 2021-10-02 NOTE — Telephone Encounter (Signed)
Transmission received see below:

## 2021-10-05 NOTE — Progress Notes (Signed)
Remote pacemaker transmission.   

## 2021-10-07 ENCOUNTER — Ambulatory Visit (INDEPENDENT_AMBULATORY_CARE_PROVIDER_SITE_OTHER): Payer: Medicare Other | Admitting: Student

## 2021-10-07 ENCOUNTER — Encounter: Payer: Self-pay | Admitting: Student

## 2021-10-07 VITALS — BP 126/68 | HR 77 | Ht 71.0 in | Wt 191.6 lb

## 2021-10-07 DIAGNOSIS — I471 Supraventricular tachycardia: Secondary | ICD-10-CM

## 2021-10-07 DIAGNOSIS — I480 Paroxysmal atrial fibrillation: Secondary | ICD-10-CM

## 2021-10-07 DIAGNOSIS — I495 Sick sinus syndrome: Secondary | ICD-10-CM | POA: Diagnosis not present

## 2021-10-07 LAB — CUP PACEART INCLINIC DEVICE CHECK
Battery Remaining Longevity: 124 mo
Battery Voltage: 3.09 V
Brady Statistic AP VP Percent: 3.51 %
Brady Statistic AP VS Percent: 90.93 %
Brady Statistic AS VP Percent: 0.05 %
Brady Statistic AS VS Percent: 5.52 %
Brady Statistic RA Percent Paced: 91.16 %
Brady Statistic RV Percent Paced: 3.79 %
Date Time Interrogation Session: 20230524082532
Implantable Lead Implant Date: 20140130
Implantable Lead Implant Date: 20140130
Implantable Lead Location: 753859
Implantable Lead Location: 753860
Implantable Pulse Generator Implant Date: 20221107
Lead Channel Impedance Value: 494 Ohm
Lead Channel Impedance Value: 589 Ohm
Lead Channel Impedance Value: 722 Ohm
Lead Channel Impedance Value: 779 Ohm
Lead Channel Pacing Threshold Amplitude: 0.75 V
Lead Channel Pacing Threshold Amplitude: 1.25 V
Lead Channel Pacing Threshold Pulse Width: 0.4 ms
Lead Channel Pacing Threshold Pulse Width: 0.4 ms
Lead Channel Sensing Intrinsic Amplitude: 3.625 mV
Lead Channel Sensing Intrinsic Amplitude: 4.125 mV
Lead Channel Sensing Intrinsic Amplitude: 5.25 mV
Lead Channel Sensing Intrinsic Amplitude: 5.375 mV
Lead Channel Setting Pacing Amplitude: 2 V
Lead Channel Setting Pacing Amplitude: 2.5 V
Lead Channel Setting Pacing Pulse Width: 0.4 ms
Lead Channel Setting Sensing Sensitivity: 0.9 mV

## 2021-10-07 MED ORDER — METOPROLOL SUCCINATE ER 50 MG PO TB24: 50.0000 mg | ORAL_TABLET | Freq: Every day | ORAL | 3 refills | Status: DC

## 2021-10-07 NOTE — Progress Notes (Signed)
Electrophysiology Office Note Date: 10/07/2021  ID:  Richard [name], DOB 1940/05/24, MRN [id number]  PCP: Burnard Bunting, MD Primary Cardiologist: Cristopher Peru, MD Electrophysiologist: Cristopher Peru, MD   CC: Pacemaker follow-up  Richard [name] is a 81 y.o. male seen today for Cristopher Peru, MD for acute visit due to palpitations .  Since last being seen in our clinic the patient reports dizziness and palpitations, similar to when he has been in AF in the past.  They are brief, usually coming in waves every couple of days. Last series or episodes was while golfing Monday, where he may have been lacking in hydration. He overall tolerates them well, but has notices a pronounced increase since the weather has started to get warm. he denies chest pain, dyspnea, PND, orthopnea, nausea, vomiting, dizziness, syncope, edema, weight gain, or early satiety.  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 where it was done, 4-5 yrs. ago   Hyperlipidemia 06/16/2012   Hypertension    Pacemaker    [employer]; implanted 06/15/12   PAF (paroxysmal atrial fibrillation) ([place])    Past Surgical History:  Procedure Laterality Date   COLONOSCOPY     INSERT / REPLACE / REMOVE PACEMAKER  06/15/2012   dual chamber   KNEE ARTHROPLASTY  04/19/2012   Procedure: COMPUTER ASSISTED TOTAL KNEE ARTHROPLASTY;  Surgeon: Alta Corning, MD;  Location: [place];  Service: Orthopedics;  Laterality: Right;  GENERAL WITH PRE OP FEMORAL NERVE BLOCK   PERMANENT PACEMAKER INSERTION N/A 06/15/2012   Procedure: PERMANENT PACEMAKER INSERTION;  Surgeon: Sanda Klein,  MD;  Location: [place] [facility];  Service: Cardiovascular;  Laterality: N/A;   PPM GENERATOR CHANGEOUT N/A 03/23/2021   Procedure: PPM GENERATOR CHANGEOUT;  Surgeon: Evans Lance, MD;  Location: [facility];  Service: Cardiovascular;  Laterality: N/A;   TONSILLECTOMY     TOTAL HIP ARTHROPLASTY Right 05/21/2016   Procedure: TOTAL HIP ARTHROPLASTY ANTERIOR APPROACH;  Surgeon: Dorna Leitz, MD;  Location: [facility];  Service: Orthopedics;  Laterality: Right;   TOTAL KNEE ARTHROPLASTY  04/19/2012   RIGHT KNEE    Current Outpatient Medications  Medication Sig Dispense Refill   atenolol (TENORMIN) 25 MG tablet TAKE 1 TABLET BY MOUTH EVERY DAY 90 tablet 3   Calcium Citrate-Vitamin D (CALCIUM CITRATE + PO) Take 1 tablet by mouth daily.     Multiple Vitamin (MULTIVITAMIN WITH MINERALS) TABS Take 1 tablet by mouth daily.     naproxen sodium (ALEVE) 220 MG tablet Take 440 mg by mouth daily as needed (pain).     Omega-3 Fatty Acids (FISH OIL) 1200 MG CAPS Take 1,200 mg by mouth daily.     simvastatin (ZOCOR) 40 MG tablet Take 1 tablet (40 mg total) by mouth daily. 90 tablet 2   warfarin (COUMADIN) 2.5 MG tablet TAKE 1/2 TABLET BY MOUTH DAILY, EXCEPT 1 TABLET ON MONDAY AND FRIDAY OR AS DIRECTED 60 tablet 0   No current facility-administered medications for this visit.    Allergies:   Patient has no known allergies.   Social  History: Social History   Socioeconomic History   Marital status: Married    Spouse name: Not on file   Number of children: 2   Years of education: Not on file   Highest education level: Not on file  Occupational History   Occupation: retired  Tobacco Use   Smoking status: Never   Smokeless tobacco: Never  Vaping Use   Vaping Use: Never used  Substance and Sexual Activity   Alcohol use: Yes    Comment: week- 1-2 drinks, 1-2 beers    Drug use: No   Sexual activity: Not on file  Other Topics Concern   Not on file  Social History Narrative   Not on file   Social  Determinants of Health   Financial Resource Strain: Not on file  Food Insecurity: Not on file  Transportation Needs: Not on file  Physical Activity: Not on file  Stress: Not on file  Social Connections: Not on file  Intimate Partner Violence: Not on file    Family History: Family History  Problem Relation Age of Onset   Heart disease Father    Colon cancer Neg Hx    Esophageal cancer Neg Hx    Rectal cancer Neg Hx    Stomach cancer Neg Hx      Review of Systems: All other systems reviewed and are otherwise negative except as noted above.  Physical Exam: There were no vitals filed for this visit.   GEN- The patient is well appearing, alert and oriented x 3 today.   HEENT: normocephalic, atraumatic; sclera clear, conjunctiva pink; hearing intact; oropharynx clear; neck supple  Lungs- Clear to ausculation bilaterally, normal work of breathing.  No wheezes, rales, rhonchi Heart- Regular rate and rhythm, no murmurs, rubs or gallops  GI- soft, non-tender, non-distended, bowel sounds present  Extremities- no clubbing or cyanosis. No edema MS- no significant deformity or atrophy Skin- warm and dry, no rash or lesion; PPM pocket well healed Psych- euthymic mood, full affect Neuro- strength and sensation are intact  PPM Interrogation- reviewed in detail today,  See PACEART report  EKG:  EKG is not ordered today.  Recent Labs: 03/03/2021: BUN 21; Creatinine, Ser 0.88; Hemoglobin 14.6; Platelets 218; Potassium 5.2; Sodium 142   Wt Readings from Last 3 Encounters:  05/26/21 193 lb (87.5 kg)  03/23/21 183 lb (83 kg)  02/17/21 189 lb 6.4 oz (85.9 kg)     Other studies Reviewed: Additional studies/ records that were reviewed today include: Previous EP office notes, Previous remote checks, Most recent labwork.   Assessment and Plan:  1. 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 Paroxysmal atrial fibrillation 3. SVT 2.9%  burden by device interrogation On coumadin for CHA2DS2VASC of 3. Denies bleeding Change atenolol to toprol 50 mg qhs. He has been on atenolol for many years. Titrate up as needed   4. HTN Stable on current regimen   Current medicines are reviewed at length with the patient today.    Disposition:   Follow up with Dr. Lovena Le in 6 months, sooner with worsening issues.     Jacalyn Lefevre, PA-C  10/07/2021 8:10 AM  [facility] [address] [place] [address] [phone] (office) [phone] (fax)

## 2021-10-07 NOTE — Patient Instructions (Signed)
Medication Instructions:  Your physician has recommended you make the following change in your medication:   DISCONTINUE: Atenolol START: Metoprolol Succinate '50mg'$  daily at bedtime  *If you need a refill on your cardiac medications before your next appointment, please call your pharmacy*   Lab Work: None If you have labs (blood work) drawn today and your tests are completely normal, you will receive your results only by: [place] (if you have MyChart) OR A paper copy in the mail If you have any lab test that is abnormal or we need to change your treatment, we will call you to review the results.   Follow-Up: At [facility], you and your health needs are our priority.  As part of our continuing mission to provide you with exceptional heart care, we have created designated Provider Care Teams.  These Care Teams include your primary Cardiologist (physician) and Advanced Practice Providers (APPs -  Physician Assistants and Nurse Practitioners) who all work together to provide you with the care you need, when you need it.   Your next appointment:   6 month(s)  The format for your next appointment:   In Person  Provider:   Cristopher Peru, MD{

## 2021-10-09 ENCOUNTER — Ambulatory Visit (INDEPENDENT_AMBULATORY_CARE_PROVIDER_SITE_OTHER): Payer: Medicare Other

## 2021-10-09 DIAGNOSIS — Z7901 Long term (current) use of anticoagulants: Secondary | ICD-10-CM | POA: Diagnosis not present

## 2021-10-09 DIAGNOSIS — I82722 Chronic embolism and thrombosis of deep veins of left upper extremity: Secondary | ICD-10-CM

## 2021-10-09 LAB — POCT INR: INR: 2.1 (ref 2.0–3.0)

## 2021-10-09 NOTE — Patient Instructions (Signed)
Continue 1/2 a tablet daily except for 1 tablet on Mondays and Fridays.Recheck INR in  6 weeks. Coumadin Clinic [phone].

## 2021-11-20 ENCOUNTER — Ambulatory Visit (INDEPENDENT_AMBULATORY_CARE_PROVIDER_SITE_OTHER): Payer: Medicare Other | Admitting: *Deleted

## 2021-11-20 DIAGNOSIS — I4891 Unspecified atrial fibrillation: Secondary | ICD-10-CM

## 2021-11-20 DIAGNOSIS — Z7901 Long term (current) use of anticoagulants: Secondary | ICD-10-CM | POA: Diagnosis not present

## 2021-11-20 DIAGNOSIS — I82722 Chronic embolism and thrombosis of deep veins of left upper extremity: Secondary | ICD-10-CM

## 2021-11-20 LAB — POCT INR: INR: 1.9 — AB (ref 2.0–3.0)

## 2021-11-20 NOTE — Patient Instructions (Signed)
Description   Today take 1.5 tablets then continue 1/2 tablet daily except for 1 tablet on Mondays and Fridays.Recheck INR in  6 weeks. Coumadin Clinic [phone].

## 2021-11-23 ENCOUNTER — Telehealth: Payer: Self-pay | Admitting: Internal Medicine

## 2021-11-23 DIAGNOSIS — R0600 Dyspnea, unspecified: Secondary | ICD-10-CM | POA: Diagnosis not present

## 2021-11-23 DIAGNOSIS — Z950 Presence of cardiac pacemaker: Secondary | ICD-10-CM | POA: Diagnosis not present

## 2021-11-23 DIAGNOSIS — R030 Elevated blood-pressure reading, without diagnosis of hypertension: Secondary | ICD-10-CM | POA: Diagnosis not present

## 2021-11-23 DIAGNOSIS — E785 Hyperlipidemia, unspecified: Secondary | ICD-10-CM | POA: Diagnosis not present

## 2021-11-23 DIAGNOSIS — Z7901 Long term (current) use of anticoagulants: Secondary | ICD-10-CM | POA: Diagnosis not present

## 2021-11-23 DIAGNOSIS — R5383 Other fatigue: Secondary | ICD-10-CM | POA: Diagnosis not present

## 2021-11-23 DIAGNOSIS — I4891 Unspecified atrial fibrillation: Secondary | ICD-10-CM | POA: Diagnosis not present

## 2021-11-23 DIAGNOSIS — E663 Overweight: Secondary | ICD-10-CM | POA: Diagnosis not present

## 2021-11-23 NOTE — Telephone Encounter (Signed)
Dr. Reynaldo Minium was calling to speak to Dr Lovena Le in regards to the patient. He states that he can be called on his cell [phone] . Please advise

## 2021-11-23 NOTE — Telephone Encounter (Signed)
Pt scheduled to see GT 11/24/2021 per Dr. Lovena Le.

## 2021-11-23 NOTE — Telephone Encounter (Signed)
Secure chat sent to Dr. Lovena Le with Pt and Dr. Reynaldo Minium information.

## 2021-11-24 ENCOUNTER — Encounter: Payer: Self-pay | Admitting: Internal Medicine

## 2021-11-24 ENCOUNTER — Ambulatory Visit (INDEPENDENT_AMBULATORY_CARE_PROVIDER_SITE_OTHER): Payer: Medicare Other | Admitting: Internal Medicine

## 2021-11-24 VITALS — BP 120/86 | HR 85 | Ht 71.0 in | Wt 191.8 lb

## 2021-11-24 DIAGNOSIS — I495 Sick sinus syndrome: Secondary | ICD-10-CM | POA: Diagnosis not present

## 2021-11-24 DIAGNOSIS — I480 Paroxysmal atrial fibrillation: Secondary | ICD-10-CM | POA: Diagnosis not present

## 2021-11-24 DIAGNOSIS — Z950 Presence of cardiac pacemaker: Secondary | ICD-10-CM | POA: Diagnosis not present

## 2021-11-24 DIAGNOSIS — R0609 Other forms of dyspnea: Secondary | ICD-10-CM

## 2021-11-24 DIAGNOSIS — R072 Precordial pain: Secondary | ICD-10-CM

## 2021-11-24 NOTE — Progress Notes (Signed)
HPI Mr. Puglia returns today for followup. He is a pleasant 81 yo man with a h/o HTN, sinus node dysfunction and PAF on warfarin. He has done well in the interim. He hs developed dyspnea with exertion and vague chest pressure. He has trouble walking up any incline and has to stop and rest, a new symptom  No syncope. He underwent PM gen change out in September. He has been more sedentary and not exercised as much.   No Known Allergies   Current Outpatient Medications  Medication Sig Dispense Refill   Calcium Citrate-Vitamin D (CALCIUM CITRATE + PO) Take 1 tablet by mouth daily.     metoprolol succinate (TOPROL-XL) 50 MG 24 hr tablet Take 1 tablet (50 mg total) by mouth at bedtime. Take with or immediately following a meal. 90 tablet 3   Multiple Vitamin (MULTIVITAMIN WITH MINERALS) TABS Take 1 tablet by mouth daily.     naproxen sodium (ALEVE) 220 MG tablet Take 440 mg by mouth daily as needed (pain).     Omega-3 Fatty Acids (FISH OIL) 1200 MG CAPS Take 1,200 mg by mouth daily.     simvastatin (ZOCOR) 40 MG tablet Take 1 tablet (40 mg total) by mouth daily. 90 tablet 2   warfarin (COUMADIN) 2.5 MG tablet TAKE 1/2 TABLET BY MOUTH DAILY, EXCEPT 1 TABLET ON MONDAY AND FRIDAY OR AS DIRECTED 60 tablet 0   No current facility-administered medications for this visit.     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 where it was done, 4-5 yrs. ago   Hyperlipidemia 06/16/2012   Hypertension    Pacemaker    [employer]; implanted 06/15/12   PAF (paroxysmal atrial fibrillation) (HCC)     ROS:   All systems reviewed and negative except as noted in the HPI.   Past Surgical History:  Procedure Laterality Date    COLONOSCOPY     INSERT / REPLACE / REMOVE PACEMAKER  06/15/2012   dual chamber   KNEE ARTHROPLASTY  04/19/2012   Procedure: COMPUTER ASSISTED TOTAL KNEE ARTHROPLASTY;  Surgeon: Alta Corning, MD;  Location: [place];  Service: Orthopedics;  Laterality: Right;  GENERAL WITH PRE OP FEMORAL NERVE BLOCK   PERMANENT PACEMAKER INSERTION N/A 06/15/2012   Procedure: PERMANENT PACEMAKER INSERTION;  Surgeon: Sanda Klein, MD;  Location: [place] [facility];  Service: Cardiovascular;  Laterality: N/A;   PPM GENERATOR CHANGEOUT N/A 03/23/2021   Procedure: PPM GENERATOR CHANGEOUT;  Surgeon: Evans Lance, MD;  Location: [facility];  Service: Cardiovascular;  Laterality: N/A;   TONSILLECTOMY     TOTAL HIP ARTHROPLASTY Right 05/21/2016   Procedure: TOTAL HIP ARTHROPLASTY ANTERIOR APPROACH;  Surgeon: Dorna Leitz, MD;  Location: [facility];  Service: Orthopedics;  Laterality: Right;   TOTAL KNEE ARTHROPLASTY  04/19/2012   RIGHT KNEE     Family History  Problem Relation Age of Onset   Heart disease Father    Colon cancer Neg Hx    Esophageal cancer Neg Hx    Rectal cancer Neg Hx    Stomach cancer Neg Hx      Social History   Socioeconomic  History   Marital status: Married    Spouse name: Not on file   Number of children: 2   Years of education: Not on file   Highest education level: Not on file  Occupational History   Occupation: retired  Tobacco Use   Smoking status: Never   Smokeless tobacco: Never  Vaping Use   Vaping Use: Never used  Substance and Sexual Activity   Alcohol use: Yes    Comment: week- 1-2 drinks, 1-2 beers    Drug use: No   Sexual activity: Not on file  Other Topics Concern   Not on file  Social History Narrative   Not on file   Social Determinants of Health   Financial Resource Strain: Not on file  Food Insecurity: Not on file  Transportation Needs: Not on file  Physical Activity: Not on file  Stress: Not on file  Social Connections: Not on file  Intimate Partner  Violence: Not on file     BP 120/86   Pulse 85   Ht '5\' 11"'$  (1.803 m)   Wt 191 lb 12.8 oz (87 kg)   SpO2 95%   BMI 26.75 kg/m   Physical Exam:  Well appearing NAD HEENT: Unremarkable Neck:  No JVD, no thyromegally Lymphatics:  No adenopathy Back:  No CVA tenderness Lungs:  Clear HEART:  Regular rate rhythm, no murmurs, no rubs, no clicks Abd:  soft, positive bowel sounds, no organomegally, no rebound, no guarding Ext:  2 plus pulses, no edema, no cyanosis, no clubbing Skin:  No rashes no nodules Neuro:  CN II through XII intact, motor grossly intact  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  PAF - he denies symptoms.  Coags - he will continue systemic anti-coag HTN - his bp is controlled. No change in his meds.  Carleene Overlie Jamichael Knotts,MD

## 2021-11-24 NOTE — Patient Instructions (Addendum)
Medication Instructions:  Your physician recommends that you continue on your current medications as directed. Please refer to the Current Medication list given to you today.  Labwork: None ordered.  Testing/Procedures:  You will be scheduled for an exercise myoview.   Follow-Up: Your physician wants you to follow-up based on results of stress test.   Any Other Special Instructions Will Be Listed Below (If Applicable).  If you need a refill on your cardiac medications before your next appointment, please call your pharmacy.   Important Information About Sugar

## 2021-11-25 ENCOUNTER — Telehealth (HOSPITAL_COMMUNITY): Payer: Self-pay | Admitting: *Deleted

## 2021-11-25 NOTE — Telephone Encounter (Signed)
Patient given detailed instructions per Myocardial Perfusion Study Information Sheet for the test on  11/30/21. Patient notified to arrive 15 minutes early and that it is imperative to arrive on time for appointment to keep from having the test rescheduled.  If you need to cancel or reschedule your appointment, please call the office within 24 hours of your appointment. . Patient verbalized understanding. [name]   

## 2021-11-30 ENCOUNTER — Ambulatory Visit (HOSPITAL_COMMUNITY): Payer: Medicare Other | Attending: Internal Medicine

## 2021-11-30 DIAGNOSIS — R072 Precordial pain: Secondary | ICD-10-CM | POA: Diagnosis not present

## 2021-11-30 LAB — MYOCARDIAL PERFUSION IMAGING
Angina Index: 0
Duke Treadmill Score: 8
Estimated workload: 9.7
Exercise duration (min): 7 min
Exercise duration (sec): 45 s
LV dias vol: 86 mL (ref 62–150)
LV sys vol: 41 mL
MPHR: 140 {beats}/min
Nuc Stress EF: 52 %
Peak HR: 127 {beats}/min
Percent HR: 90 %
Rest HR: 76 {beats}/min
Rest Nuclear Isotope Dose: 10.1 mCi
SDS: 0
SRS: 0
SSS: 0
ST Depression (mm): 0 mm
Stress Nuclear Isotope Dose: 29.7 mCi
TID: 0.96

## 2021-11-30 MED ORDER — TECHNETIUM TC 99M TETROFOSMIN IV KIT
10.1000 | PACK | Freq: Once | INTRAVENOUS | Status: AC | PRN
  Administered 2021-11-30: 10.1 via INTRAVENOUS

## 2021-11-30 MED ORDER — TECHNETIUM TC 99M TETROFOSMIN IV KIT
29.7000 | PACK | Freq: Once | INTRAVENOUS | Status: AC | PRN
  Administered 2021-11-30: 29.7 via INTRAVENOUS

## 2021-12-03 ENCOUNTER — Telehealth: Payer: Self-pay | Admitting: Internal Medicine

## 2021-12-03 NOTE — Telephone Encounter (Signed)
° ° °  Pt is calling to get stress test result °

## 2021-12-04 NOTE — Telephone Encounter (Signed)
Stress test reviewed by Dr. Caryl Comes: Myoview was reviewed.  Low risk.  No ischemia.  EF 52%  Outreach made to Pt.  Advised of results of stress test.  Will follow up with GT in regards to upcoming plan of care.  Pt aware will call back with further recs.  He is understanding.

## 2021-12-17 NOTE — Telephone Encounter (Signed)
Returned call to Pt.  Advised Dr. Lovena Le does not recommend any change in therapy at this time.  Advised to keep follow up in November as scheduled.

## 2021-12-21 ENCOUNTER — Other Ambulatory Visit: Payer: Self-pay | Admitting: Internal Medicine

## 2021-12-21 DIAGNOSIS — I480 Paroxysmal atrial fibrillation: Secondary | ICD-10-CM

## 2021-12-21 NOTE — Telephone Encounter (Signed)
Prescription refill request received for warfarin Lov: 11/24/21 Richard [name])  Next INR check: 01/01/22 Warfarin tablet strength: 2.'5mg'$   Appropriate dose and refill sent to requested pharmacy.

## 2021-12-22 ENCOUNTER — Ambulatory Visit (INDEPENDENT_AMBULATORY_CARE_PROVIDER_SITE_OTHER): Payer: Medicare Other

## 2021-12-22 DIAGNOSIS — I495 Sick sinus syndrome: Secondary | ICD-10-CM

## 2021-12-22 LAB — CUP PACEART REMOTE DEVICE CHECK
Battery Remaining Longevity: 122 mo
Battery Voltage: 3.04 V
Brady Statistic AP VP Percent: 5.63 %
Brady Statistic AP VS Percent: 88.36 %
Brady Statistic AS VP Percent: 0.14 %
Brady Statistic AS VS Percent: 5.87 %
Brady Statistic RA Percent Paced: 91.57 %
Brady Statistic RV Percent Paced: 6.31 %
Date Time Interrogation Session: 20230807230809
Implantable Lead Implant Date: 20140130
Implantable Lead Implant Date: 20140130
Implantable Lead Location: 753859
Implantable Lead Location: 753860
Implantable Pulse Generator Implant Date: 20221107
Lead Channel Impedance Value: 475 Ohm
Lead Channel Impedance Value: 570 Ohm
Lead Channel Impedance Value: 722 Ohm
Lead Channel Impedance Value: 779 Ohm
Lead Channel Pacing Threshold Amplitude: 0.625 V
Lead Channel Pacing Threshold Amplitude: 1.25 V
Lead Channel Pacing Threshold Pulse Width: 0.4 ms
Lead Channel Pacing Threshold Pulse Width: 0.4 ms
Lead Channel Sensing Intrinsic Amplitude: 3.875 mV
Lead Channel Sensing Intrinsic Amplitude: 3.875 mV
Lead Channel Sensing Intrinsic Amplitude: 5.75 mV
Lead Channel Sensing Intrinsic Amplitude: 5.75 mV
Lead Channel Setting Pacing Amplitude: 2 V
Lead Channel Setting Pacing Amplitude: 2.5 V
Lead Channel Setting Pacing Pulse Width: 0.4 ms
Lead Channel Setting Sensing Sensitivity: 0.9 mV

## 2022-01-01 ENCOUNTER — Ambulatory Visit (INDEPENDENT_AMBULATORY_CARE_PROVIDER_SITE_OTHER): Payer: Medicare Other | Admitting: *Deleted

## 2022-01-01 DIAGNOSIS — I82722 Chronic embolism and thrombosis of deep veins of left upper extremity: Secondary | ICD-10-CM

## 2022-01-01 DIAGNOSIS — Z7901 Long term (current) use of anticoagulants: Secondary | ICD-10-CM | POA: Diagnosis not present

## 2022-01-01 DIAGNOSIS — I4891 Unspecified atrial fibrillation: Secondary | ICD-10-CM | POA: Diagnosis not present

## 2022-01-01 LAB — POCT INR: INR: 3 (ref 2.0–3.0)

## 2022-01-01 NOTE — Patient Instructions (Signed)
Description   Continue taking 1/2 tablet daily except for 1 tablet on Mondays and Fridays.Recheck INR in 6 weeks. Coumadin Clinic [phone].       

## 2022-01-26 NOTE — Progress Notes (Signed)
Remote pacemaker transmission.   

## 2022-02-12 ENCOUNTER — Ambulatory Visit: Payer: Medicare Other | Attending: Cardiovascular Disease

## 2022-02-12 DIAGNOSIS — Z7901 Long term (current) use of anticoagulants: Secondary | ICD-10-CM | POA: Diagnosis not present

## 2022-02-12 DIAGNOSIS — I480 Paroxysmal atrial fibrillation: Secondary | ICD-10-CM

## 2022-02-12 DIAGNOSIS — Z5181 Encounter for therapeutic drug level monitoring: Secondary | ICD-10-CM | POA: Diagnosis not present

## 2022-02-12 LAB — POCT INR: INR: 3.6 — AB (ref 2.0–3.0)

## 2022-02-12 NOTE — Patient Instructions (Signed)
HOLD TONIGHT ONLY and then Continue taking 1/2 tablet daily except for 1 tablet on Mondays and Fridays.Recheck INR in 6 weeks. Coumadin Clinic [phone].

## 2022-03-23 ENCOUNTER — Ambulatory Visit (INDEPENDENT_AMBULATORY_CARE_PROVIDER_SITE_OTHER): Payer: Medicare Other

## 2022-03-23 DIAGNOSIS — I495 Sick sinus syndrome: Secondary | ICD-10-CM

## 2022-03-23 LAB — CUP PACEART REMOTE DEVICE CHECK
Battery Remaining Longevity: 119 mo
Battery Voltage: 3.02 V
Brady Statistic AP VP Percent: 6.38 %
Brady Statistic AP VS Percent: 89.92 %
Brady Statistic AS VP Percent: 0.1 %
Brady Statistic AS VS Percent: 3.61 %
Brady Statistic RA Percent Paced: 90.5 %
Brady Statistic RV Percent Paced: 7.18 %
Date Time Interrogation Session: 20231107000241
Implantable Lead Connection Status: 753985
Implantable Lead Connection Status: 753985
Implantable Lead Implant Date: 20140130
Implantable Lead Implant Date: 20140130
Implantable Lead Location: 753859
Implantable Lead Location: 753860
Implantable Pulse Generator Implant Date: 20221107
Lead Channel Impedance Value: 494 Ohm
Lead Channel Impedance Value: 608 Ohm
Lead Channel Impedance Value: 703 Ohm
Lead Channel Impedance Value: 779 Ohm
Lead Channel Pacing Threshold Amplitude: 0.75 V
Lead Channel Pacing Threshold Amplitude: 1.625 V
Lead Channel Pacing Threshold Pulse Width: 0.4 ms
Lead Channel Pacing Threshold Pulse Width: 0.4 ms
Lead Channel Sensing Intrinsic Amplitude: 5.125 mV
Lead Channel Sensing Intrinsic Amplitude: 5.125 mV
Lead Channel Sensing Intrinsic Amplitude: 6 mV
Lead Channel Sensing Intrinsic Amplitude: 6 mV
Lead Channel Setting Pacing Amplitude: 2 V
Lead Channel Setting Pacing Amplitude: 2.5 V
Lead Channel Setting Pacing Pulse Width: 0.4 ms
Lead Channel Setting Sensing Sensitivity: 0.9 mV
Zone Setting Status: 755011
Zone Setting Status: 755011

## 2022-03-26 ENCOUNTER — Ambulatory Visit: Payer: Medicare Other | Attending: Cardiovascular Disease

## 2022-03-26 DIAGNOSIS — Z7901 Long term (current) use of anticoagulants: Secondary | ICD-10-CM | POA: Diagnosis not present

## 2022-03-26 DIAGNOSIS — I480 Paroxysmal atrial fibrillation: Secondary | ICD-10-CM | POA: Diagnosis not present

## 2022-03-26 DIAGNOSIS — I82729 Chronic embolism and thrombosis of deep veins of unspecified upper extremity: Secondary | ICD-10-CM

## 2022-03-26 DIAGNOSIS — I4891 Unspecified atrial fibrillation: Secondary | ICD-10-CM

## 2022-03-26 LAB — POCT INR: INR: 2.6 (ref 2.0–3.0)

## 2022-03-26 NOTE — Patient Instructions (Signed)
Continue taking 1/2 tablet daily except for 1 tablet on Mondays and Fridays.Recheck INR in 6 weeks. Coumadin Clinic [phone].

## 2022-03-30 DIAGNOSIS — S86812A Strain of other muscle(s) and tendon(s) at lower leg level, left leg, initial encounter: Secondary | ICD-10-CM | POA: Diagnosis not present

## 2022-04-15 ENCOUNTER — Ambulatory Visit: Payer: Medicare Other | Attending: Internal Medicine | Admitting: Internal Medicine

## 2022-04-15 ENCOUNTER — Encounter: Payer: Self-pay | Admitting: Internal Medicine

## 2022-04-15 VITALS — BP 122/74 | HR 87 | Ht 71.0 in | Wt 190.4 lb

## 2022-04-15 DIAGNOSIS — I10 Essential (primary) hypertension: Secondary | ICD-10-CM | POA: Diagnosis not present

## 2022-04-15 DIAGNOSIS — Z950 Presence of cardiac pacemaker: Secondary | ICD-10-CM | POA: Diagnosis not present

## 2022-04-15 DIAGNOSIS — I480 Paroxysmal atrial fibrillation: Secondary | ICD-10-CM | POA: Insufficient documentation

## 2022-04-15 DIAGNOSIS — I495 Sick sinus syndrome: Secondary | ICD-10-CM | POA: Insufficient documentation

## 2022-04-15 LAB — CUP PACEART INCLINIC DEVICE CHECK
Battery Remaining Longevity: 120 mo
Battery Voltage: 3.02 V
Brady Statistic AP VP Percent: 5.59 %
Brady Statistic AP VS Percent: 89.45 %
Brady Statistic AS VP Percent: 0.12 %
Brady Statistic AS VS Percent: 4.84 %
Brady Statistic RA Percent Paced: 91.13 %
Brady Statistic RV Percent Paced: 6.3 %
Date Time Interrogation Session: 20231130083119
Implantable Lead Connection Status: 753985
Implantable Lead Connection Status: 753985
Implantable Lead Implant Date: 20140130
Implantable Lead Implant Date: 20140130
Implantable Lead Location: 753859
Implantable Lead Location: 753860
Implantable Pulse Generator Implant Date: 20221107
Lead Channel Impedance Value: 513 Ohm
Lead Channel Impedance Value: 627 Ohm
Lead Channel Impedance Value: 760 Ohm
Lead Channel Impedance Value: 817 Ohm
Lead Channel Pacing Threshold Amplitude: 0.75 V
Lead Channel Pacing Threshold Amplitude: 1.625 V
Lead Channel Pacing Threshold Pulse Width: 0.4 ms
Lead Channel Pacing Threshold Pulse Width: 0.4 ms
Lead Channel Sensing Intrinsic Amplitude: 4.75 mV
Lead Channel Sensing Intrinsic Amplitude: 4.75 mV
Lead Channel Sensing Intrinsic Amplitude: 5.75 mV
Lead Channel Sensing Intrinsic Amplitude: 6.875 mV
Lead Channel Setting Pacing Amplitude: 2 V
Lead Channel Setting Pacing Amplitude: 2.5 V
Lead Channel Setting Pacing Pulse Width: 0.4 ms
Lead Channel Setting Sensing Sensitivity: 0.9 mV
Zone Setting Status: 755011
Zone Setting Status: 755011

## 2022-04-15 NOTE — Progress Notes (Signed)
HPI Richard [name] returns today for followup. He is a pleasant 81 yo man with a h/o HTN, sinus node dysfunction and PAF on warfarin. He has done well in the interim. His dyspnea with exertion and vague chest pressure have resolved. No syncope. He underwent PM gen change out last year.  He has been more sedentary and not exercised as much.  No Known Allergies   Current Outpatient Medications  Medication Sig Dispense Refill   Calcium Citrate-Vitamin D (CALCIUM CITRATE + PO) Take 1 tablet by mouth daily.     metoprolol succinate (TOPROL-XL) 50 MG 24 hr tablet Take 1 tablet (50 mg total) by mouth at bedtime. Take with or immediately following a meal. 90 tablet 3   Multiple Vitamin (MULTIVITAMIN WITH MINERALS) TABS Take 1 tablet by mouth daily.     naproxen sodium (ALEVE) 220 MG tablet Take 440 mg by mouth daily as needed (pain).     Omega-3 Fatty Acids (FISH OIL) 1200 MG CAPS Take 1,200 mg by mouth daily.     simvastatin (ZOCOR) 40 MG tablet Take 1 tablet (40 mg total) by mouth daily. 90 tablet 2   warfarin (COUMADIN) 2.5 MG tablet TAKE 1/2 TABLET BY MOUTH DAILY, EXCEPT 1 TABLET ON MONDAY AND FRIDAY OR AS DIRECTED 65 tablet 0   No current facility-administered medications for this visit.     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 where it was done, 4-5 yrs. ago   Hyperlipidemia 06/16/2012   Hypertension    Pacemaker    Medtronic; implanted 06/15/12   PAF (paroxysmal atrial fibrillation) (HCC)     ROS:   All systems reviewed and negative except as noted in the HPI.   Past Surgical History:  Procedure Laterality Date   COLONOSCOPY     INSERT / REPLACE / REMOVE PACEMAKER  06/15/2012   dual chamber   KNEE  ARTHROPLASTY  04/19/2012   Procedure: COMPUTER ASSISTED TOTAL KNEE ARTHROPLASTY;  Surgeon: Alta Corning, MD;  Location: [facility];  Service: Orthopedics;  Laterality: Right;  GENERAL WITH PRE OP FEMORAL NERVE BLOCK   PERMANENT PACEMAKER INSERTION N/A 06/15/2012   Procedure: PERMANENT PACEMAKER INSERTION;  Surgeon: Sanda Klein, MD;  Location: [facility];  Service: Cardiovascular;  Laterality: N/A;   PPM GENERATOR CHANGEOUT N/A 03/23/2021   Procedure: PPM GENERATOR CHANGEOUT;  Surgeon: Evans Lance, MD;  Location: [facility];  Service: Cardiovascular;  Laterality: N/A;   TONSILLECTOMY     TOTAL HIP ARTHROPLASTY Right 05/21/2016   Procedure: TOTAL HIP ARTHROPLASTY ANTERIOR APPROACH;  Surgeon: Dorna Leitz, MD;  Location: [facility];  Service: Orthopedics;  Laterality: Right;   TOTAL KNEE ARTHROPLASTY  04/19/2012   RIGHT KNEE     Family History  Problem Relation Age of Onset   Heart disease Father    Colon cancer Neg Hx    Esophageal cancer Neg Hx    Rectal cancer Neg Hx    Stomach cancer Neg Hx      Social History   Socioeconomic History   Marital status: Married    Spouse name: Not on file   Number  of children: 2   Years of education: Not on file   Highest education level: Not on file  Occupational History   Occupation: retired  Tobacco Use   Smoking status: Never   Smokeless tobacco: Never  Vaping Use   Vaping Use: Never used  Substance and Sexual Activity   Alcohol use: Yes    Comment: week- 1-2 drinks, 1-2 beers    Drug use: No   Sexual activity: Not on file  Other Topics Concern   Not on file  Social History Narrative   Not on file   Social Determinants of Health   Financial Resource Strain: Not on file  Food Insecurity: Not on file  Transportation Needs: Not on file  Physical Activity: Not on file  Stress: Not on file  Social Connections: Not on file  Intimate Partner Violence: Not on file     BP 122/74   Pulse 87   Ht '5\' 11"'$  (1.803 m)   Wt 190 lb  6.4 oz (86.4 kg)   SpO2 95%   BMI 26.56 kg/m   Physical Exam:  Well appearing 81 yo man, NAD HEENT: Unremarkable Neck:  No JVD, no thyromegally Lymphatics:  No adenopathy Back:  No CVA tenderness Lungs:  Clear with no wheezes HEART:  Regular rate rhythm, no murmurs, no rubs, no clicks Abd:  soft, positive bowel sounds, no organomegally, no rebound, no guarding Ext:  2 plus pulses, no edema, no cyanosis, no clubbing Skin:  No rashes no nodules Neuro:  CN II through XII intact, motor grossly intact  DEVICE  Normal device function.  See PaceArt for details.   Assess/Plan:  Exertional dyspnea - this has improved markedly. PAF - he denies symptoms.  Coags - he will continue systemic anti-coag HTN - his bp is controlled. No change in his meds.   Richard Overlie Tylisha Danis,MD

## 2022-04-15 NOTE — Patient Instructions (Signed)
Medication Instructions:  Your physician recommends that you continue on your current medications as directed. Please refer to the Current Medication list given to you today.  *If you need a refill on your cardiac medications before your next appointment, please call your pharmacy*  Lab Work: None ordered.  If you have labs (blood work) drawn today and your tests are completely normal, you will receive your results only by: Richard [name] (if you have MyChart) OR A paper copy in the mail If you have any lab test that is abnormal or we need to change your treatment, we will call you to review the results.  Testing/Procedures: None ordered.  Follow-Up: At [facility], you and your health needs are our priority.  As part of our continuing mission to provide you with exceptional heart care, we have created designated Provider Care Teams.  These Care Teams include your primary Cardiologist (physician) and Advanced Practice Providers (APPs -  Physician Assistants and Nurse Practitioners) who all work together to provide you with the care you need, when you need it.  We recommend signing up for the patient portal called "MyChart".  Sign up information is provided on this After Visit Summary.  MyChart is used to connect with patients for Virtual Visits (Telemedicine).  Patients are able to view lab/test results, encounter notes, upcoming appointments, etc.  Non-urgent messages can be sent to your provider as well.   To learn more about what you can do with MyChart, go to [email or web address].    Your next appointment:   1 year(s)  The format for your next appointment:   In Person  Provider:   Cristopher Peru, MD{or one of the following Advanced Practice Providers on your designated Care Team:   [name], [name] "[name]" Chalmers Cater, [place]  Remote monitoring is used to monitor your Pacemaker from home. This monitoring reduces the number of office visits required to check your device to  one time per year. It allows [place] to keep an eye on the functioning of your device to ensure it is working properly. You are scheduled for a device check from home on 06/22/22. You may send your transmission at any time that day. If you have a wireless device, the transmission will be sent automatically. After your physician reviews your transmission, you will receive a postcard with your next transmission date.  Important Information About Sugar

## 2022-04-16 NOTE — Progress Notes (Signed)
Remote pacemaker transmission.   

## 2022-05-07 ENCOUNTER — Ambulatory Visit: Payer: Medicare Other | Attending: Cardiology

## 2022-05-07 DIAGNOSIS — I480 Paroxysmal atrial fibrillation: Secondary | ICD-10-CM

## 2022-05-07 DIAGNOSIS — Z7901 Long term (current) use of anticoagulants: Secondary | ICD-10-CM | POA: Diagnosis not present

## 2022-05-07 DIAGNOSIS — Z5181 Encounter for therapeutic drug level monitoring: Secondary | ICD-10-CM | POA: Diagnosis not present

## 2022-05-07 LAB — POCT INR: INR: 2.1 (ref 2.0–3.0)

## 2022-05-07 NOTE — Patient Instructions (Signed)
Continue taking 1/2 tablet daily except for 1 tablet on Mondays and Fridays.Recheck INR in 6 weeks. [facility]madin Clinic [phone].

## 2022-05-24 DIAGNOSIS — Z1321 Encounter for screening for nutritional disorder: Secondary | ICD-10-CM | POA: Diagnosis not present

## 2022-05-24 DIAGNOSIS — R5383 Other fatigue: Secondary | ICD-10-CM | POA: Diagnosis not present

## 2022-05-24 DIAGNOSIS — R030 Elevated blood-pressure reading, without diagnosis of hypertension: Secondary | ICD-10-CM | POA: Diagnosis not present

## 2022-05-24 DIAGNOSIS — Z125 Encounter for screening for malignant neoplasm of prostate: Secondary | ICD-10-CM | POA: Diagnosis not present

## 2022-05-24 DIAGNOSIS — E785 Hyperlipidemia, unspecified: Secondary | ICD-10-CM | POA: Diagnosis not present

## 2022-05-31 DIAGNOSIS — I4891 Unspecified atrial fibrillation: Secondary | ICD-10-CM | POA: Diagnosis not present

## 2022-05-31 DIAGNOSIS — R82998 Other abnormal findings in urine: Secondary | ICD-10-CM | POA: Diagnosis not present

## 2022-05-31 DIAGNOSIS — Z23 Encounter for immunization: Secondary | ICD-10-CM | POA: Diagnosis not present

## 2022-05-31 DIAGNOSIS — Z0000 Encounter for general adult medical examination without abnormal findings: Secondary | ICD-10-CM | POA: Diagnosis not present

## 2022-05-31 DIAGNOSIS — Z7901 Long term (current) use of anticoagulants: Secondary | ICD-10-CM | POA: Diagnosis not present

## 2022-05-31 DIAGNOSIS — R030 Elevated blood-pressure reading, without diagnosis of hypertension: Secondary | ICD-10-CM | POA: Diagnosis not present

## 2022-05-31 DIAGNOSIS — E785 Hyperlipidemia, unspecified: Secondary | ICD-10-CM | POA: Diagnosis not present

## 2022-05-31 DIAGNOSIS — Z1331 Encounter for screening for depression: Secondary | ICD-10-CM | POA: Diagnosis not present

## 2022-05-31 DIAGNOSIS — Z1339 Encounter for screening examination for other mental health and behavioral disorders: Secondary | ICD-10-CM | POA: Diagnosis not present

## 2022-06-18 ENCOUNTER — Ambulatory Visit: Payer: Medicare HMO | Attending: Cardiovascular Disease | Admitting: *Deleted

## 2022-06-18 DIAGNOSIS — I4891 Unspecified atrial fibrillation: Secondary | ICD-10-CM

## 2022-06-18 DIAGNOSIS — I82729 Chronic embolism and thrombosis of deep veins of unspecified upper extremity: Secondary | ICD-10-CM

## 2022-06-18 DIAGNOSIS — Z7901 Long term (current) use of anticoagulants: Secondary | ICD-10-CM

## 2022-06-18 DIAGNOSIS — I480 Paroxysmal atrial fibrillation: Secondary | ICD-10-CM | POA: Diagnosis not present

## 2022-06-18 LAB — POCT INR: INR: 2.7 (ref 2.0–3.0)

## 2022-06-18 NOTE — Patient Instructions (Signed)
Description   Continue taking 1/2 tablet daily except for 1 tablet on Mondays and Fridays.Recheck INR in 6 weeks. Coumadin Clinic [phone].

## 2022-06-22 ENCOUNTER — Ambulatory Visit: Payer: Medicare HMO

## 2022-06-22 DIAGNOSIS — I495 Sick sinus syndrome: Secondary | ICD-10-CM

## 2022-06-22 LAB — CUP PACEART REMOTE DEVICE CHECK
Battery Remaining Longevity: 117 mo
Battery Voltage: 3.01 V
Brady Statistic AP VP Percent: 11.37 %
Brady Statistic AP VS Percent: 85.82 %
Brady Statistic AS VP Percent: 0.18 %
Brady Statistic AS VS Percent: 2.65 %
Brady Statistic RA Percent Paced: 93.1 %
Brady Statistic RV Percent Paced: 12.1 %
Date Time Interrogation Session: 20240206005045
Implantable Lead Connection Status: 753985
Implantable Lead Connection Status: 753985
Implantable Lead Implant Date: 20140130
Implantable Lead Implant Date: 20140130
Implantable Lead Location: 753859
Implantable Lead Location: 753860
Implantable Pulse Generator Implant Date: 20221107
Lead Channel Impedance Value: 494 Ohm
Lead Channel Impedance Value: 608 Ohm
Lead Channel Impedance Value: 741 Ohm
Lead Channel Impedance Value: 798 Ohm
Lead Channel Pacing Threshold Amplitude: 0.75 V
Lead Channel Pacing Threshold Amplitude: 1.75 V
Lead Channel Pacing Threshold Pulse Width: 0.4 ms
Lead Channel Pacing Threshold Pulse Width: 0.4 ms
Lead Channel Sensing Intrinsic Amplitude: 4.125 mV
Lead Channel Sensing Intrinsic Amplitude: 4.125 mV
Lead Channel Sensing Intrinsic Amplitude: 6.5 mV
Lead Channel Sensing Intrinsic Amplitude: 6.5 mV
Lead Channel Setting Pacing Amplitude: 2 V
Lead Channel Setting Pacing Amplitude: 2.5 V
Lead Channel Setting Pacing Pulse Width: 0.4 ms
Lead Channel Setting Sensing Sensitivity: 0.9 mV
Zone Setting Status: 755011
Zone Setting Status: 755011

## 2022-07-09 ENCOUNTER — Other Ambulatory Visit: Payer: Self-pay

## 2022-07-09 DIAGNOSIS — I480 Paroxysmal atrial fibrillation: Secondary | ICD-10-CM

## 2022-07-09 MED ORDER — WARFARIN SODIUM 2.5 MG PO TABS: ORAL_TABLET | ORAL | 1 refills | Status: DC

## 2022-07-09 MED ORDER — WARFARIN SODIUM 2.5 MG PO TABS: ORAL_TABLET | ORAL | 0 refills | Status: DC

## 2022-07-09 NOTE — Addendum Note (Signed)
Addended by: Leonidas Romberg on: 07/09/2022 03:31 PM   Modules accepted: Orders

## 2022-07-09 NOTE — Telephone Encounter (Signed)
Prescription refill request received for warfarin Lov: 04/15/22 [name] Next INR check: 07/30/22 Warfarin tablet strength: 2.'5mg'$   Appropriate dose. Refill sent.

## 2022-07-20 NOTE — Progress Notes (Signed)
Remote pacemaker transmission.   

## 2022-07-30 ENCOUNTER — Ambulatory Visit: Payer: Medicare HMO | Attending: Internal Medicine | Admitting: *Deleted

## 2022-07-30 DIAGNOSIS — I480 Paroxysmal atrial fibrillation: Secondary | ICD-10-CM | POA: Diagnosis not present

## 2022-07-30 DIAGNOSIS — Z7901 Long term (current) use of anticoagulants: Secondary | ICD-10-CM

## 2022-07-30 DIAGNOSIS — I82729 Chronic embolism and thrombosis of deep veins of unspecified upper extremity: Secondary | ICD-10-CM | POA: Diagnosis not present

## 2022-07-30 DIAGNOSIS — I4891 Unspecified atrial fibrillation: Secondary | ICD-10-CM | POA: Diagnosis not present

## 2022-07-30 LAB — POCT INR: INR: 2.9 (ref 2.0–3.0)

## 2022-07-30 NOTE — Patient Instructions (Signed)
Description   Continue taking 1/2 tablet daily except for 1 tablet on Mondays and Fridays.Recheck INR in  6 weeks. Coumadin Clinic [phone].       

## 2022-08-24 DIAGNOSIS — L570 Actinic keratosis: Secondary | ICD-10-CM | POA: Diagnosis not present

## 2022-08-24 DIAGNOSIS — L821 Other seborrheic keratosis: Secondary | ICD-10-CM | POA: Diagnosis not present

## 2022-08-24 DIAGNOSIS — Z08 Encounter for follow-up examination after completed treatment for malignant neoplasm: Secondary | ICD-10-CM | POA: Diagnosis not present

## 2022-08-24 DIAGNOSIS — D225 Melanocytic nevi of trunk: Secondary | ICD-10-CM | POA: Diagnosis not present

## 2022-08-24 DIAGNOSIS — C44629 Squamous cell carcinoma of skin of left upper limb, including shoulder: Secondary | ICD-10-CM | POA: Diagnosis not present

## 2022-08-24 DIAGNOSIS — Z85828 Personal history of other malignant neoplasm of skin: Secondary | ICD-10-CM | POA: Diagnosis not present

## 2022-08-24 DIAGNOSIS — X32XXXD Exposure to sunlight, subsequent encounter: Secondary | ICD-10-CM | POA: Diagnosis not present

## 2022-09-10 ENCOUNTER — Ambulatory Visit: Payer: Medicare HMO | Attending: Internal Medicine | Admitting: *Deleted

## 2022-09-10 DIAGNOSIS — I4891 Unspecified atrial fibrillation: Secondary | ICD-10-CM | POA: Diagnosis not present

## 2022-09-10 DIAGNOSIS — I82729 Chronic embolism and thrombosis of deep veins of unspecified upper extremity: Secondary | ICD-10-CM

## 2022-09-10 DIAGNOSIS — Z7901 Long term (current) use of anticoagulants: Secondary | ICD-10-CM | POA: Diagnosis not present

## 2022-09-10 DIAGNOSIS — I480 Paroxysmal atrial fibrillation: Secondary | ICD-10-CM

## 2022-09-10 LAB — POCT INR: INR: 3.3 — AB (ref 2.0–3.0)

## 2022-09-10 NOTE — Patient Instructions (Signed)
Description   Do not take any warfarin today then continue taking 1/2 tablet daily except for 1 tablet on Mondays and Fridays. Recheck INR in 6 weeks. [facility]in Clinic [phone].

## 2022-09-14 DIAGNOSIS — H0100A Unspecified blepharitis right eye, upper and lower eyelids: Secondary | ICD-10-CM | POA: Diagnosis not present

## 2022-09-14 DIAGNOSIS — H43813 Vitreous degeneration, bilateral: Secondary | ICD-10-CM | POA: Diagnosis not present

## 2022-09-14 DIAGNOSIS — H35371 Puckering of macula, right eye: Secondary | ICD-10-CM | POA: Diagnosis not present

## 2022-09-14 DIAGNOSIS — H0100B Unspecified blepharitis left eye, upper and lower eyelids: Secondary | ICD-10-CM | POA: Diagnosis not present

## 2022-09-14 DIAGNOSIS — H26493 Other secondary cataract, bilateral: Secondary | ICD-10-CM | POA: Diagnosis not present

## 2022-09-21 ENCOUNTER — Ambulatory Visit (INDEPENDENT_AMBULATORY_CARE_PROVIDER_SITE_OTHER): Payer: Medicare HMO

## 2022-09-21 DIAGNOSIS — I495 Sick sinus syndrome: Secondary | ICD-10-CM

## 2022-09-21 DIAGNOSIS — Z08 Encounter for follow-up examination after completed treatment for malignant neoplasm: Secondary | ICD-10-CM | POA: Diagnosis not present

## 2022-09-21 DIAGNOSIS — Z85828 Personal history of other malignant neoplasm of skin: Secondary | ICD-10-CM | POA: Diagnosis not present

## 2022-09-21 LAB — CUP PACEART REMOTE DEVICE CHECK
Battery Remaining Longevity: 115 mo
Battery Voltage: 3.01 V
Brady Statistic AP VP Percent: 13.52 %
Brady Statistic AP VS Percent: 83.64 %
Brady Statistic AS VP Percent: 0.24 %
Brady Statistic AS VS Percent: 2.61 %
Brady Statistic RA Percent Paced: 90.91 %
Brady Statistic RV Percent Paced: 14.52 %
Date Time Interrogation Session: 20240507032904
Implantable Lead Connection Status: 753985
Implantable Lead Connection Status: 753985
Implantable Lead Implant Date: 20140130
Implantable Lead Implant Date: 20140130
Implantable Lead Location: 753859
Implantable Lead Location: 753860
Implantable Pulse Generator Implant Date: 20221107
Lead Channel Impedance Value: 513 Ohm
Lead Channel Impedance Value: 627 Ohm
Lead Channel Impedance Value: 760 Ohm
Lead Channel Impedance Value: 817 Ohm
Lead Channel Pacing Threshold Amplitude: 0.625 V
Lead Channel Pacing Threshold Amplitude: 1.75 V
Lead Channel Pacing Threshold Pulse Width: 0.4 ms
Lead Channel Pacing Threshold Pulse Width: 0.4 ms
Lead Channel Sensing Intrinsic Amplitude: 1.5 mV
Lead Channel Sensing Intrinsic Amplitude: 1.5 mV
Lead Channel Sensing Intrinsic Amplitude: 6.25 mV
Lead Channel Sensing Intrinsic Amplitude: 6.25 mV
Lead Channel Setting Pacing Amplitude: 2 V
Lead Channel Setting Pacing Amplitude: 2.5 V
Lead Channel Setting Pacing Pulse Width: 0.4 ms
Lead Channel Setting Sensing Sensitivity: 0.9 mV
Zone Setting Status: 755011
Zone Setting Status: 755011

## 2022-09-24 ENCOUNTER — Other Ambulatory Visit: Payer: Self-pay | Admitting: Student

## 2022-10-13 NOTE — Progress Notes (Signed)
Remote pacemaker transmission.   

## 2022-10-22 ENCOUNTER — Ambulatory Visit: Payer: Medicare HMO | Attending: Internal Medicine

## 2022-10-22 DIAGNOSIS — I480 Paroxysmal atrial fibrillation: Secondary | ICD-10-CM

## 2022-10-22 DIAGNOSIS — Z7901 Long term (current) use of anticoagulants: Secondary | ICD-10-CM

## 2022-10-22 LAB — POCT INR: INR: 2.9 (ref 2.0–3.0)

## 2022-10-22 NOTE — Patient Instructions (Signed)
continue taking 1/2 tablet daily except for 1 tablet on Mondays and Fridays. Recheck INR in 6 weeks. [facility]madin Clinic [phone].

## 2022-10-25 ENCOUNTER — Other Ambulatory Visit: Payer: Self-pay | Admitting: Internal Medicine

## 2022-10-25 DIAGNOSIS — I480 Paroxysmal atrial fibrillation: Secondary | ICD-10-CM

## 2022-10-25 NOTE — Telephone Encounter (Signed)
Refill request for warfarin:  Last INR was 2.9 on 10/22/22 Next INR due 12/03/22 LOV was 04/15/22  Refill approved.

## 2022-11-16 DIAGNOSIS — C4442 Squamous cell carcinoma of skin of scalp and neck: Secondary | ICD-10-CM | POA: Diagnosis not present

## 2022-12-03 ENCOUNTER — Ambulatory Visit: Payer: Medicare HMO | Attending: Internal Medicine

## 2022-12-03 DIAGNOSIS — I480 Paroxysmal atrial fibrillation: Secondary | ICD-10-CM | POA: Diagnosis not present

## 2022-12-03 DIAGNOSIS — Z7901 Long term (current) use of anticoagulants: Secondary | ICD-10-CM | POA: Diagnosis not present

## 2022-12-03 LAB — POCT INR: INR: 3.1 — AB (ref 2.0–3.0)

## 2022-12-03 NOTE — Patient Instructions (Signed)
continue taking 1/2 tablet daily except for 1 tablet on Mondays and Fridays. Recheck INR in 6 weeks. [facility]madin Clinic [phone].

## 2022-12-08 DIAGNOSIS — M1990 Unspecified osteoarthritis, unspecified site: Secondary | ICD-10-CM | POA: Diagnosis not present

## 2022-12-08 DIAGNOSIS — R030 Elevated blood-pressure reading, without diagnosis of hypertension: Secondary | ICD-10-CM | POA: Diagnosis not present

## 2022-12-08 DIAGNOSIS — E785 Hyperlipidemia, unspecified: Secondary | ICD-10-CM | POA: Diagnosis not present

## 2022-12-08 DIAGNOSIS — E663 Overweight: Secondary | ICD-10-CM | POA: Diagnosis not present

## 2022-12-21 ENCOUNTER — Ambulatory Visit: Payer: Medicare Other

## 2022-12-21 DIAGNOSIS — I495 Sick sinus syndrome: Secondary | ICD-10-CM | POA: Diagnosis not present

## 2023-01-05 NOTE — Progress Notes (Signed)
Remote pacemaker transmission.   

## 2023-01-14 ENCOUNTER — Ambulatory Visit: Payer: Medicare HMO | Attending: Cardiology | Admitting: *Deleted

## 2023-01-14 DIAGNOSIS — I82729 Chronic embolism and thrombosis of deep veins of unspecified upper extremity: Secondary | ICD-10-CM | POA: Diagnosis not present

## 2023-01-14 DIAGNOSIS — I4891 Unspecified atrial fibrillation: Secondary | ICD-10-CM | POA: Diagnosis not present

## 2023-01-14 DIAGNOSIS — I480 Paroxysmal atrial fibrillation: Secondary | ICD-10-CM | POA: Diagnosis not present

## 2023-01-14 DIAGNOSIS — Z7901 Long term (current) use of anticoagulants: Secondary | ICD-10-CM | POA: Diagnosis not present

## 2023-01-14 LAB — POCT INR: INR: 3.6 — AB (ref 2.0–3.0)

## 2023-01-14 NOTE — Patient Instructions (Signed)
Description   DO not take any warfarin today then continue taking 1/2 tablet daily except for 1 tablet on Mondays and Fridays. Recheck INR in 4 weeks. Coumadin Clinic [phone].

## 2023-01-22 DIAGNOSIS — L0291 Cutaneous abscess, unspecified: Secondary | ICD-10-CM | POA: Diagnosis not present

## 2023-01-31 DIAGNOSIS — J069 Acute upper respiratory infection, unspecified: Secondary | ICD-10-CM | POA: Diagnosis not present

## 2023-01-31 DIAGNOSIS — R030 Elevated blood-pressure reading, without diagnosis of hypertension: Secondary | ICD-10-CM | POA: Diagnosis not present

## 2023-01-31 DIAGNOSIS — R051 Acute cough: Secondary | ICD-10-CM | POA: Diagnosis not present

## 2023-02-11 ENCOUNTER — Ambulatory Visit: Payer: Medicare HMO | Attending: Internal Medicine | Admitting: *Deleted

## 2023-02-11 DIAGNOSIS — I480 Paroxysmal atrial fibrillation: Secondary | ICD-10-CM

## 2023-02-11 DIAGNOSIS — I4891 Unspecified atrial fibrillation: Secondary | ICD-10-CM | POA: Diagnosis not present

## 2023-02-11 DIAGNOSIS — Z7901 Long term (current) use of anticoagulants: Secondary | ICD-10-CM | POA: Diagnosis not present

## 2023-02-11 DIAGNOSIS — I82729 Chronic embolism and thrombosis of deep veins of unspecified upper extremity: Secondary | ICD-10-CM

## 2023-02-11 LAB — POCT INR: INR: 4.3 — AB (ref 2.0–3.0)

## 2023-02-11 NOTE — Patient Instructions (Signed)
Description   DO not take any warfarin today then START taking 1/2 tablet daily except for 1 tablet on  Fridays. Recheck INR in 3 weeks. Coumadin Clinic [phone].

## 2023-03-04 ENCOUNTER — Ambulatory Visit: Payer: Medicare HMO | Attending: Internal Medicine

## 2023-03-04 DIAGNOSIS — Z7901 Long term (current) use of anticoagulants: Secondary | ICD-10-CM | POA: Diagnosis not present

## 2023-03-04 DIAGNOSIS — I480 Paroxysmal atrial fibrillation: Secondary | ICD-10-CM | POA: Diagnosis not present

## 2023-03-04 LAB — POCT INR: INR: 2.1 (ref 2.0–3.0)

## 2023-03-04 NOTE — Patient Instructions (Signed)
CONTINUE taking 1/2 tablet daily except for 1 tablet on  Fridays. Recheck INR in 5 weeks. Coumadin Clinic [phone].

## 2023-03-22 ENCOUNTER — Ambulatory Visit (INDEPENDENT_AMBULATORY_CARE_PROVIDER_SITE_OTHER): Payer: Medicare HMO

## 2023-03-22 DIAGNOSIS — Z08 Encounter for follow-up examination after completed treatment for malignant neoplasm: Secondary | ICD-10-CM | POA: Diagnosis not present

## 2023-03-22 DIAGNOSIS — I4891 Unspecified atrial fibrillation: Secondary | ICD-10-CM

## 2023-03-22 DIAGNOSIS — L570 Actinic keratosis: Secondary | ICD-10-CM | POA: Diagnosis not present

## 2023-03-22 DIAGNOSIS — Z85828 Personal history of other malignant neoplasm of skin: Secondary | ICD-10-CM | POA: Diagnosis not present

## 2023-03-22 DIAGNOSIS — X32XXXD Exposure to sunlight, subsequent encounter: Secondary | ICD-10-CM | POA: Diagnosis not present

## 2023-03-22 LAB — CUP PACEART REMOTE DEVICE CHECK
Battery Remaining Longevity: 112 mo
Battery Voltage: 3 V
Brady Statistic AP VP Percent: 6.14 %
Brady Statistic AP VS Percent: 88.57 %
Brady Statistic AS VP Percent: 0.09 %
Brady Statistic AS VS Percent: 5.21 %
Brady Statistic RA Percent Paced: 78.68 %
Brady Statistic RV Percent Paced: 8.27 %
Date Time Interrogation Session: 20241104223821
Implantable Lead Connection Status: 753985
Implantable Lead Connection Status: 753985
Implantable Lead Implant Date: 20140130
Implantable Lead Implant Date: 20140130
Implantable Lead Location: 753859
Implantable Lead Location: 753860
Implantable Pulse Generator Implant Date: 20221107
Lead Channel Impedance Value: 475 Ohm
Lead Channel Impedance Value: 608 Ohm
Lead Channel Impedance Value: 798 Ohm
Lead Channel Impedance Value: 874 Ohm
Lead Channel Pacing Threshold Amplitude: 0.875 V
Lead Channel Pacing Threshold Amplitude: 1.75 V
Lead Channel Pacing Threshold Pulse Width: 0.4 ms
Lead Channel Pacing Threshold Pulse Width: 0.4 ms
Lead Channel Sensing Intrinsic Amplitude: 2.125 mV
Lead Channel Sensing Intrinsic Amplitude: 2.125 mV
Lead Channel Sensing Intrinsic Amplitude: 5.875 mV
Lead Channel Sensing Intrinsic Amplitude: 5.875 mV
Lead Channel Setting Pacing Amplitude: 2 V
Lead Channel Setting Pacing Amplitude: 2.5 V
Lead Channel Setting Pacing Pulse Width: 0.4 ms
Lead Channel Setting Sensing Sensitivity: 0.9 mV
Zone Setting Status: 755011
Zone Setting Status: 755011

## 2023-04-08 ENCOUNTER — Ambulatory Visit: Payer: Medicare HMO | Attending: Cardiovascular Disease

## 2023-04-08 DIAGNOSIS — I82729 Chronic embolism and thrombosis of deep veins of unspecified upper extremity: Secondary | ICD-10-CM

## 2023-04-08 DIAGNOSIS — Z7901 Long term (current) use of anticoagulants: Secondary | ICD-10-CM | POA: Diagnosis not present

## 2023-04-08 DIAGNOSIS — I4891 Unspecified atrial fibrillation: Secondary | ICD-10-CM

## 2023-04-08 DIAGNOSIS — I480 Paroxysmal atrial fibrillation: Secondary | ICD-10-CM

## 2023-04-08 LAB — POCT INR: INR: 2.4 (ref 2.0–3.0)

## 2023-04-08 NOTE — Patient Instructions (Signed)
Description   CONTINUE taking 1/2 tablet daily except for 1 tablet on Fridays. Recheck INR in 6 weeks. Coumadin Clinic [phone].

## 2023-04-19 NOTE — Progress Notes (Signed)
Remote pacemaker transmission.   

## 2023-05-20 ENCOUNTER — Ambulatory Visit: Payer: Medicare HMO | Attending: Cardiovascular Disease | Admitting: *Deleted

## 2023-05-20 DIAGNOSIS — I480 Paroxysmal atrial fibrillation: Secondary | ICD-10-CM

## 2023-05-20 DIAGNOSIS — Z7901 Long term (current) use of anticoagulants: Secondary | ICD-10-CM

## 2023-05-20 DIAGNOSIS — I4891 Unspecified atrial fibrillation: Secondary | ICD-10-CM | POA: Diagnosis not present

## 2023-05-20 DIAGNOSIS — I82729 Chronic embolism and thrombosis of deep veins of unspecified upper extremity: Secondary | ICD-10-CM

## 2023-05-20 LAB — POCT INR: INR: 2.1 (ref 2.0–3.0)

## 2023-05-20 NOTE — Patient Instructions (Signed)
 Description   CONTINUE taking 1/2 tablet daily except for 1 tablet on Fridays. Recheck INR in 6 weeks. Coumadin Clinic [phone].

## 2023-05-30 DIAGNOSIS — E785 Hyperlipidemia, unspecified: Secondary | ICD-10-CM | POA: Diagnosis not present

## 2023-05-30 DIAGNOSIS — Z0000 Encounter for general adult medical examination without abnormal findings: Secondary | ICD-10-CM | POA: Diagnosis not present

## 2023-05-30 DIAGNOSIS — R5383 Other fatigue: Secondary | ICD-10-CM | POA: Diagnosis not present

## 2023-05-30 DIAGNOSIS — Z125 Encounter for screening for malignant neoplasm of prostate: Secondary | ICD-10-CM | POA: Diagnosis not present

## 2023-06-06 DIAGNOSIS — D6869 Other thrombophilia: Secondary | ICD-10-CM | POA: Diagnosis not present

## 2023-06-06 DIAGNOSIS — R030 Elevated blood-pressure reading, without diagnosis of hypertension: Secondary | ICD-10-CM | POA: Diagnosis not present

## 2023-06-06 DIAGNOSIS — R82998 Other abnormal findings in urine: Secondary | ICD-10-CM | POA: Diagnosis not present

## 2023-06-06 DIAGNOSIS — Z0000 Encounter for general adult medical examination without abnormal findings: Secondary | ICD-10-CM | POA: Diagnosis not present

## 2023-06-06 DIAGNOSIS — E663 Overweight: Secondary | ICD-10-CM | POA: Diagnosis not present

## 2023-06-06 DIAGNOSIS — Z7901 Long term (current) use of anticoagulants: Secondary | ICD-10-CM | POA: Diagnosis not present

## 2023-06-06 DIAGNOSIS — Z950 Presence of cardiac pacemaker: Secondary | ICD-10-CM | POA: Diagnosis not present

## 2023-06-06 DIAGNOSIS — I495 Sick sinus syndrome: Secondary | ICD-10-CM | POA: Diagnosis not present

## 2023-06-06 DIAGNOSIS — Z1339 Encounter for screening examination for other mental health and behavioral disorders: Secondary | ICD-10-CM | POA: Diagnosis not present

## 2023-06-06 DIAGNOSIS — Z1331 Encounter for screening for depression: Secondary | ICD-10-CM | POA: Diagnosis not present

## 2023-06-06 DIAGNOSIS — I4891 Unspecified atrial fibrillation: Secondary | ICD-10-CM | POA: Diagnosis not present

## 2023-06-06 DIAGNOSIS — E785 Hyperlipidemia, unspecified: Secondary | ICD-10-CM | POA: Diagnosis not present

## 2023-06-21 ENCOUNTER — Ambulatory Visit (INDEPENDENT_AMBULATORY_CARE_PROVIDER_SITE_OTHER): Payer: Medicare Other

## 2023-06-21 DIAGNOSIS — I495 Sick sinus syndrome: Secondary | ICD-10-CM

## 2023-06-21 DIAGNOSIS — I4891 Unspecified atrial fibrillation: Secondary | ICD-10-CM

## 2023-06-22 ENCOUNTER — Encounter: Payer: Self-pay | Admitting: Internal Medicine

## 2023-06-22 LAB — CUP PACEART REMOTE DEVICE CHECK
Battery Remaining Longevity: 110 mo
Battery Voltage: 3 V
Brady Statistic AP VP Percent: 4.45 %
Brady Statistic AP VS Percent: 90.21 %
Brady Statistic AS VP Percent: 0.19 %
Brady Statistic AS VS Percent: 5.14 %
Brady Statistic RA Percent Paced: 79.46 %
Brady Statistic RV Percent Paced: 8.98 %
Date Time Interrogation Session: 20250203231426
Implantable Lead Connection Status: 753985
Implantable Lead Connection Status: 753985
Implantable Lead Implant Date: 20140130
Implantable Lead Implant Date: 20140130
Implantable Lead Location: 753859
Implantable Lead Location: 753860
Implantable Pulse Generator Implant Date: 20221107
Lead Channel Impedance Value: 456 Ohm
Lead Channel Impedance Value: 570 Ohm
Lead Channel Impedance Value: 855 Ohm
Lead Channel Impedance Value: 912 Ohm
Lead Channel Pacing Threshold Amplitude: 1 V
Lead Channel Pacing Threshold Amplitude: 1.625 V
Lead Channel Pacing Threshold Pulse Width: 0.4 ms
Lead Channel Pacing Threshold Pulse Width: 0.4 ms
Lead Channel Sensing Intrinsic Amplitude: 2.625 mV
Lead Channel Sensing Intrinsic Amplitude: 2.625 mV
Lead Channel Sensing Intrinsic Amplitude: 6.875 mV
Lead Channel Sensing Intrinsic Amplitude: 6.875 mV
Lead Channel Setting Pacing Amplitude: 2 V
Lead Channel Setting Pacing Amplitude: 2.5 V
Lead Channel Setting Pacing Pulse Width: 0.4 ms
Lead Channel Setting Sensing Sensitivity: 0.9 mV
Zone Setting Status: 755011
Zone Setting Status: 755011

## 2023-06-29 ENCOUNTER — Other Ambulatory Visit: Payer: Self-pay | Admitting: Internal Medicine

## 2023-06-29 ENCOUNTER — Other Ambulatory Visit: Payer: Self-pay | Admitting: Student

## 2023-06-29 MED ORDER — METOPROLOL SUCCINATE ER 50 MG PO TB24: 50.0000 mg | ORAL_TABLET | Freq: Every day | ORAL | 0 refills | Status: DC

## 2023-06-29 NOTE — Addendum Note (Signed)
Addended by: Adriana Simas, Breonia Kirstein L on: 06/29/2023 03:36 PM   Modules accepted: Orders

## 2023-06-30 ENCOUNTER — Ambulatory Visit: Payer: Medicare HMO | Attending: Internal Medicine

## 2023-06-30 DIAGNOSIS — Z7901 Long term (current) use of anticoagulants: Secondary | ICD-10-CM | POA: Diagnosis not present

## 2023-06-30 DIAGNOSIS — I480 Paroxysmal atrial fibrillation: Secondary | ICD-10-CM | POA: Diagnosis not present

## 2023-06-30 LAB — POCT INR: INR: 2.7 (ref 2.0–3.0)

## 2023-06-30 NOTE — Patient Instructions (Signed)
Description   CONTINUE taking 1/2 tablet daily except for 1 tablet on Fridays. Recheck INR in 6 weeks. Coumadin Clinic [phone].

## 2023-07-27 NOTE — Progress Notes (Unsigned)
  Electrophysiology Office Note:   ID:  Angelica, Wix [birth year], MRN [id number]  Primary Cardiologist: Lewayne Bunting, MD Electrophysiologist: Lewayne Bunting, MD  {Click to update primary MD,subspecialty MD or APP then REFRESH:1}    History of Present Illness:   Richard [name] is a 83 y.o. male with h/o HTN, SND s/p PPM, chronic coumadin, and PAF seen today for routine electrophysiology followup.   Since last being seen in our clinic the patient reports doing ***.  he denies chest pain, palpitations, dyspnea, PND, orthopnea, nausea, vomiting, dizziness, syncope, edema, weight gain, or early satiety.   Review of systems complete and found to be negative unless listed in HPI.   EP Information / Studies Reviewed:    EKG is ordered today. Personal review as below.       PPM Interrogation-  reviewed in detail today,  See PACEART report.  Arrhythmia/Device History Medtronic Dual Chamber PPM implanted 05/2012, gen change 03/2021 for SND   Physical Exam:   VS:  There were no vitals taken for this visit.   Wt Readings from Last 3 Encounters:  04/15/22 190 lb 6.4 oz (86.4 kg)  11/30/21 191 lb (86.6 kg)  11/24/21 191 lb 12.8 oz (87 kg)     GEN: No acute distress  NECK: No JVD; No carotid bruits CARDIAC: {EPRHYTHM:28826}, no murmurs, rubs, gallops RESPIRATORY:  Clear to auscultation without rales, wheezing or rhonchi  ABDOMEN: Soft, non-tender, non-distended EXTREMITIES:  {EDEMA LEVEL:28147::"No"} edema; No deformity   ASSESSMENT AND PLAN:    SND s/p Medtronic PPM  Normal PPM function See Pace Art report No changes today  Paroxysmal AF EKG today shows *** Continue coumadin for CHA2DS2/VASc  of at least 3.   HTN Stable on current regimen   Secondary hypercoagulable state Pt on Coumadin as above    {Click here to Review PMH, Prob List, Meds, Allergies, SHx, FHx  :1}   Disposition:   Follow up with {EPPROVIDERS:28135} {EPFOLLOW  UP:28173}  Signed, Graciella Freer, PA-C

## 2023-07-28 ENCOUNTER — Encounter: Payer: Self-pay | Admitting: Student

## 2023-07-28 ENCOUNTER — Ambulatory Visit: Payer: Medicare HMO | Attending: Student | Admitting: Student

## 2023-07-28 VITALS — BP 126/98 | HR 80 | Ht 70.5 in | Wt 189.2 lb

## 2023-07-28 DIAGNOSIS — I495 Sick sinus syndrome: Secondary | ICD-10-CM | POA: Diagnosis not present

## 2023-07-28 DIAGNOSIS — Z7901 Long term (current) use of anticoagulants: Secondary | ICD-10-CM

## 2023-07-28 DIAGNOSIS — Z950 Presence of cardiac pacemaker: Secondary | ICD-10-CM

## 2023-07-28 DIAGNOSIS — I480 Paroxysmal atrial fibrillation: Secondary | ICD-10-CM | POA: Diagnosis not present

## 2023-07-28 LAB — CUP PACEART INCLINIC DEVICE CHECK
Battery Remaining Longevity: 111 mo
Battery Voltage: 3 V
Brady Statistic AP VP Percent: 9.24 %
Brady Statistic AP VS Percent: 86.89 %
Brady Statistic AS VP Percent: 0.18 %
Brady Statistic AS VS Percent: 3.69 %
Brady Statistic RA Percent Paced: 85.01 %
Brady Statistic RV Percent Paced: 11.45 %
Date Time Interrogation Session: 20250313081722
Implantable Lead Connection Status: 753985
Implantable Lead Connection Status: 753985
Implantable Lead Implant Date: 20140130
Implantable Lead Implant Date: 20140130
Implantable Lead Location: 753859
Implantable Lead Location: 753860
Implantable Pulse Generator Implant Date: 20221107
Lead Channel Impedance Value: 570 Ohm
Lead Channel Impedance Value: 684 Ohm
Lead Channel Impedance Value: 893 Ohm
Lead Channel Impedance Value: 950 Ohm
Lead Channel Pacing Threshold Amplitude: 0.75 V
Lead Channel Pacing Threshold Amplitude: 1.625 V
Lead Channel Pacing Threshold Pulse Width: 0.4 ms
Lead Channel Pacing Threshold Pulse Width: 0.4 ms
Lead Channel Sensing Intrinsic Amplitude: 5.375 mV
Lead Channel Sensing Intrinsic Amplitude: 5.375 mV
Lead Channel Sensing Intrinsic Amplitude: 6.875 mV
Lead Channel Sensing Intrinsic Amplitude: 7.875 mV
Lead Channel Setting Pacing Amplitude: 2 V
Lead Channel Setting Pacing Amplitude: 2.5 V
Lead Channel Setting Pacing Pulse Width: 0.4 ms
Lead Channel Setting Sensing Sensitivity: 0.9 mV
Zone Setting Status: 755011
Zone Setting Status: 755011

## 2023-07-28 NOTE — Progress Notes (Signed)
 Remote pacemaker transmission.

## 2023-07-28 NOTE — Patient Instructions (Signed)
 Medication Instructions:  Your physician recommends that you continue on your current medications as directed. Please refer to the Current Medication list given to you today.  *If you need a refill on your cardiac medications before your next appointment, please call your pharmacy*  Lab Work: None If you have labs (blood work) drawn today and your tests are completely normal, you will receive your results only by: MyChart Message (if you have MyChart) OR A paper copy in the mail If you have any lab test that is abnormal or we need to change your treatment, we will call you to review the results.  Follow-Up: At [facility], you and your health needs are our priority.  As part of our continuing mission to provide you with exceptional heart care, we have created designated Provider Care Teams.  These Care Teams include your primary Cardiologist (physician) and Advanced Practice Providers (APPs -  Physician Assistants and Nurse Practitioners) who all work together to provide you with the care you need, when you need it.  Your next appointment:   1 year(s)  Provider:   Casimiro Needle "Otilio Saber, PA-C

## 2023-07-28 NOTE — Addendum Note (Signed)
 Addended by: Geralyn Flash D on: 07/28/2023 12:37 PM   Modules accepted: Orders

## 2023-08-12 ENCOUNTER — Ambulatory Visit: Payer: Medicare HMO | Attending: Internal Medicine

## 2023-08-12 DIAGNOSIS — Z7901 Long term (current) use of anticoagulants: Secondary | ICD-10-CM

## 2023-08-12 DIAGNOSIS — I480 Paroxysmal atrial fibrillation: Secondary | ICD-10-CM | POA: Diagnosis not present

## 2023-08-12 DIAGNOSIS — I82729 Chronic embolism and thrombosis of deep veins of unspecified upper extremity: Secondary | ICD-10-CM

## 2023-08-12 DIAGNOSIS — I4891 Unspecified atrial fibrillation: Secondary | ICD-10-CM

## 2023-08-12 LAB — POCT INR: INR: 2.5 (ref 2.0–3.0)

## 2023-08-12 NOTE — Patient Instructions (Addendum)
 Description   CONTINUE taking 1/2 tablet daily except for 1 tablet on Fridays. Recheck INR in 6 weeks. Coumadin Clinic [phone].

## 2023-08-27 ENCOUNTER — Other Ambulatory Visit: Payer: Self-pay | Admitting: Internal Medicine

## 2023-09-20 ENCOUNTER — Ambulatory Visit (INDEPENDENT_AMBULATORY_CARE_PROVIDER_SITE_OTHER): Payer: Medicare Other

## 2023-09-20 DIAGNOSIS — I495 Sick sinus syndrome: Secondary | ICD-10-CM

## 2023-09-20 LAB — CUP PACEART REMOTE DEVICE CHECK
Battery Remaining Longevity: 108 mo
Battery Voltage: 2.99 V
Brady Statistic AP VP Percent: 8.72 %
Brady Statistic AP VS Percent: 85.7 %
Brady Statistic AS VP Percent: 0.39 %
Brady Statistic AS VS Percent: 5.17 %
Brady Statistic RA Percent Paced: 76.75 %
Brady Statistic RV Percent Paced: 13.83 %
Date Time Interrogation Session: 20250506024036
Implantable Lead Connection Status: 753985
Implantable Lead Connection Status: 753985
Implantable Lead Implant Date: 20140130
Implantable Lead Implant Date: 20140130
Implantable Lead Location: 753859
Implantable Lead Location: 753860
Implantable Pulse Generator Implant Date: 20221107
Lead Channel Impedance Value: 456 Ohm
Lead Channel Impedance Value: 589 Ohm
Lead Channel Impedance Value: 836 Ohm
Lead Channel Impedance Value: 912 Ohm
Lead Channel Pacing Threshold Amplitude: 0.75 V
Lead Channel Pacing Threshold Amplitude: 2.125 V
Lead Channel Pacing Threshold Pulse Width: 0.4 ms
Lead Channel Pacing Threshold Pulse Width: 0.4 ms
Lead Channel Sensing Intrinsic Amplitude: 5.625 mV
Lead Channel Sensing Intrinsic Amplitude: 5.625 mV
Lead Channel Sensing Intrinsic Amplitude: 6.625 mV
Lead Channel Sensing Intrinsic Amplitude: 6.625 mV
Lead Channel Setting Pacing Amplitude: 2 V
Lead Channel Setting Pacing Amplitude: 2.5 V
Lead Channel Setting Pacing Pulse Width: 0.4 ms
Lead Channel Setting Sensing Sensitivity: 0.9 mV
Zone Setting Status: 755011
Zone Setting Status: 755011

## 2023-09-21 ENCOUNTER — Encounter: Payer: Self-pay | Admitting: Internal Medicine

## 2023-09-23 ENCOUNTER — Ambulatory Visit: Attending: Internal Medicine

## 2023-09-23 DIAGNOSIS — Z7901 Long term (current) use of anticoagulants: Secondary | ICD-10-CM | POA: Diagnosis not present

## 2023-09-23 DIAGNOSIS — I480 Paroxysmal atrial fibrillation: Secondary | ICD-10-CM

## 2023-09-23 LAB — POCT INR: INR: 2.6 (ref 2.0–3.0)

## 2023-09-23 NOTE — Patient Instructions (Signed)
 CONTINUE taking 1/2 tablet daily except for 1 tablet on Fridays.  Recheck INR in 6 weeks. Coumadin  Clinic [phone].

## 2023-09-28 DIAGNOSIS — H43813 Vitreous degeneration, bilateral: Secondary | ICD-10-CM | POA: Diagnosis not present

## 2023-09-28 DIAGNOSIS — H26493 Other secondary cataract, bilateral: Secondary | ICD-10-CM | POA: Diagnosis not present

## 2023-09-28 DIAGNOSIS — H35371 Puckering of macula, right eye: Secondary | ICD-10-CM | POA: Diagnosis not present

## 2023-11-02 NOTE — Progress Notes (Signed)
 Remote pacemaker transmission.

## 2023-11-04 ENCOUNTER — Ambulatory Visit: Attending: Internal Medicine | Admitting: *Deleted

## 2023-11-04 DIAGNOSIS — I480 Paroxysmal atrial fibrillation: Secondary | ICD-10-CM | POA: Diagnosis not present

## 2023-11-04 DIAGNOSIS — Z7901 Long term (current) use of anticoagulants: Secondary | ICD-10-CM

## 2023-11-04 DIAGNOSIS — I82729 Chronic embolism and thrombosis of deep veins of unspecified upper extremity: Secondary | ICD-10-CM

## 2023-11-04 DIAGNOSIS — I4891 Unspecified atrial fibrillation: Secondary | ICD-10-CM | POA: Diagnosis not present

## 2023-11-04 LAB — POCT INR: INR: 2.1 (ref 2.0–3.0)

## 2023-11-04 NOTE — Progress Notes (Signed)
Please see anticoagulation encounter.

## 2023-11-04 NOTE — Patient Instructions (Addendum)
 Description   CONTINUE taking warfarin 1/2 tablet daily except for 1 tablet on Fridays.  Recheck INR in 6 weeks. Coumadin  Clinic [phone].

## 2023-12-14 DIAGNOSIS — Z08 Encounter for follow-up examination after completed treatment for malignant neoplasm: Secondary | ICD-10-CM | POA: Diagnosis not present

## 2023-12-14 DIAGNOSIS — C4441 Basal cell carcinoma of skin of scalp and neck: Secondary | ICD-10-CM | POA: Diagnosis not present

## 2023-12-14 DIAGNOSIS — X32XXXD Exposure to sunlight, subsequent encounter: Secondary | ICD-10-CM | POA: Diagnosis not present

## 2023-12-14 DIAGNOSIS — L570 Actinic keratosis: Secondary | ICD-10-CM | POA: Diagnosis not present

## 2023-12-14 DIAGNOSIS — Z85828 Personal history of other malignant neoplasm of skin: Secondary | ICD-10-CM | POA: Diagnosis not present

## 2023-12-16 ENCOUNTER — Ambulatory Visit: Attending: Internal Medicine

## 2023-12-16 DIAGNOSIS — Z7901 Long term (current) use of anticoagulants: Secondary | ICD-10-CM

## 2023-12-16 DIAGNOSIS — I480 Paroxysmal atrial fibrillation: Secondary | ICD-10-CM | POA: Diagnosis not present

## 2023-12-16 LAB — POCT INR: INR: 2.4 (ref 2.0–3.0)

## 2023-12-16 NOTE — Progress Notes (Signed)
 INR-2.4 Please see anticoagulation encounter.

## 2023-12-16 NOTE — Patient Instructions (Signed)
 CONTINUE taking warfarin 1/2 tablet daily except for 1 tablet on Fridays.  Recheck INR in 6 weeks. Coumadin  Clinic [phone].

## 2023-12-20 ENCOUNTER — Ambulatory Visit (INDEPENDENT_AMBULATORY_CARE_PROVIDER_SITE_OTHER): Payer: Medicare Other

## 2023-12-20 DIAGNOSIS — I495 Sick sinus syndrome: Secondary | ICD-10-CM

## 2023-12-21 LAB — CUP PACEART REMOTE DEVICE CHECK
Battery Remaining Longevity: 108 mo
Battery Voltage: 2.99 V
Brady Statistic AP VP Percent: 8.08 %
Brady Statistic AP VS Percent: 88.19 %
Brady Statistic AS VP Percent: 0.17 %
Brady Statistic AS VS Percent: 3.54 %
Brady Statistic RA Percent Paced: 70.97 %
Brady Statistic RV Percent Paced: 15.89 %
Date Time Interrogation Session: 20250804224611
Implantable Lead Connection Status: 753985
Implantable Lead Connection Status: 753985
Implantable Lead Implant Date: 20140130
Implantable Lead Implant Date: 20140130
Implantable Lead Location: 753859
Implantable Lead Location: 753860
Implantable Pulse Generator Implant Date: 20221107
Lead Channel Impedance Value: 1007 Ohm
Lead Channel Impedance Value: 456 Ohm
Lead Channel Impedance Value: 608 Ohm
Lead Channel Impedance Value: 950 Ohm
Lead Channel Pacing Threshold Amplitude: 1 V
Lead Channel Pacing Threshold Amplitude: 1.875 V
Lead Channel Pacing Threshold Pulse Width: 0.4 ms
Lead Channel Pacing Threshold Pulse Width: 0.4 ms
Lead Channel Sensing Intrinsic Amplitude: 5.875 mV
Lead Channel Sensing Intrinsic Amplitude: 5.875 mV
Lead Channel Sensing Intrinsic Amplitude: 6.25 mV
Lead Channel Sensing Intrinsic Amplitude: 6.25 mV
Lead Channel Setting Pacing Amplitude: 2 V
Lead Channel Setting Pacing Amplitude: 2.5 V
Lead Channel Setting Pacing Pulse Width: 0.4 ms
Lead Channel Setting Sensing Sensitivity: 0.9 mV
Zone Setting Status: 755011
Zone Setting Status: 755011

## 2023-12-22 ENCOUNTER — Ambulatory Visit: Payer: Self-pay | Admitting: Internal Medicine

## 2024-01-05 ENCOUNTER — Telehealth: Payer: Self-pay

## 2024-01-05 NOTE — Telephone Encounter (Signed)
 Erroneous encounter

## 2024-01-10 ENCOUNTER — Ambulatory Visit: Attending: Cardiology

## 2024-01-10 DIAGNOSIS — I495 Sick sinus syndrome: Secondary | ICD-10-CM

## 2024-01-10 LAB — CUP PACEART INCLINIC DEVICE CHECK
Battery Remaining Longevity: 106 mo
Battery Voltage: 2.99 V
Brady Statistic AP VP Percent: 7.8 %
Brady Statistic AP VS Percent: 87.89 %
Brady Statistic AS VP Percent: 0.24 %
Brady Statistic AS VS Percent: 4.05 %
Brady Statistic RA Percent Paced: 73.56 %
Brady Statistic RV Percent Paced: 14.55 %
Date Time Interrogation Session: 20250826085029
Implantable Lead Connection Status: 753985
Implantable Lead Connection Status: 753985
Implantable Lead Implant Date: 20140130
Implantable Lead Implant Date: 20140130
Implantable Lead Location: 753859
Implantable Lead Location: 753860
Implantable Pulse Generator Implant Date: 20221107
Lead Channel Impedance Value: 513 Ohm
Lead Channel Impedance Value: 665 Ohm
Lead Channel Impedance Value: 893 Ohm
Lead Channel Impedance Value: 950 Ohm
Lead Channel Pacing Threshold Amplitude: 0.875 V
Lead Channel Pacing Threshold Amplitude: 2.125 V
Lead Channel Pacing Threshold Pulse Width: 0.4 ms
Lead Channel Pacing Threshold Pulse Width: 0.4 ms
Lead Channel Sensing Intrinsic Amplitude: 1.125 mV
Lead Channel Sensing Intrinsic Amplitude: 1.125 mV
Lead Channel Sensing Intrinsic Amplitude: 6 mV
Lead Channel Sensing Intrinsic Amplitude: 6 mV
Lead Channel Setting Pacing Amplitude: 2 V
Lead Channel Setting Pacing Amplitude: 2.5 V
Lead Channel Setting Pacing Pulse Width: 0.4 ms
Lead Channel Setting Sensing Sensitivity: 0.9 mV
Zone Setting Status: 755011
Zone Setting Status: 755011

## 2024-01-10 NOTE — Progress Notes (Signed)
 Full device interrogation not done. Patient presenting today AP/VS. Device is now able to monitor atrial threshold. Atrial threshold per device 2.125V @ 0.33ms. This is WNL. AF burden 23.5%. Patient states he has noticed increased state of SOB/fatigue. Will discuss w/ Dr. Waddell and follow-up. No programming changes needed today.

## 2024-01-10 NOTE — Patient Instructions (Signed)
 Follow up as scheduled.

## 2024-01-11 ENCOUNTER — Telehealth: Payer: Self-pay

## 2024-01-11 NOTE — Telephone Encounter (Signed)
 Spoke with Dr. Waddell regarding device clinic appointment.  Per Dr. Waddell have Pt see him in clinic to discuss tx options for afib.  Pt scheduled to see Dr. Waddell 01/27/2024 after warfarin appointment.  Pt aware.

## 2024-01-27 ENCOUNTER — Ambulatory Visit: Attending: Internal Medicine | Admitting: *Deleted

## 2024-01-27 ENCOUNTER — Ambulatory Visit: Admitting: Internal Medicine

## 2024-01-27 ENCOUNTER — Encounter: Payer: Self-pay | Admitting: Internal Medicine

## 2024-01-27 VITALS — BP 106/74 | HR 71 | Ht 70.5 in | Wt 189.4 lb

## 2024-01-27 DIAGNOSIS — I480 Paroxysmal atrial fibrillation: Secondary | ICD-10-CM | POA: Diagnosis not present

## 2024-01-27 DIAGNOSIS — I82729 Chronic embolism and thrombosis of deep veins of unspecified upper extremity: Secondary | ICD-10-CM

## 2024-01-27 DIAGNOSIS — Z7901 Long term (current) use of anticoagulants: Secondary | ICD-10-CM | POA: Diagnosis not present

## 2024-01-27 DIAGNOSIS — I4891 Unspecified atrial fibrillation: Secondary | ICD-10-CM

## 2024-01-27 LAB — CUP PACEART INCLINIC DEVICE CHECK
Date Time Interrogation Session: 20250912083611
Implantable Lead Connection Status: 753985
Implantable Lead Connection Status: 753985
Implantable Lead Implant Date: 20140130
Implantable Lead Implant Date: 20140130
Implantable Lead Location: 753859
Implantable Lead Location: 753860
Implantable Pulse Generator Implant Date: 20221107

## 2024-01-27 LAB — POCT INR: INR: 2.8 (ref 2.0–3.0)

## 2024-01-27 NOTE — Progress Notes (Signed)
 Description   INR-2.8; CONTINUE taking warfarin 1/2 tablet daily except for 1 tablet on Fridays.  Recheck INR in 6 weeks. Coumadin  Clinic [phone].

## 2024-01-27 NOTE — Patient Instructions (Addendum)
 Description   INR-2.8; CONTINUE taking warfarin 1/2 tablet daily except for 1 tablet on Fridays.  Recheck INR in 6 weeks. Coumadin  Clinic [phone].

## 2024-01-27 NOTE — Progress Notes (Signed)
 HPI [name] returns today for followup. He is a pleasant 83 yo man with a h/o HTN, sinus node dysfunction and PAF on warfarin. He has done well in the interim. His dyspnea with exertion and vague chest pressure have resolved. No syncope.  He has been more sedentary and not exercised as much. He has been stressed out by his wife who has advanced dementia and is now in a facility.   No Known Allergies   Current Outpatient Medications  Medication Sig Dispense Refill   Calcium Citrate-Vitamin D (CALCIUM CITRATE + PO) Take 1 tablet by mouth daily.     metoprolol  succinate (TOPROL -XL) 50 MG 24 hr tablet Take 1 tablet (50 mg total) by mouth daily. Take with or immediately following a meal. 90 tablet 3   Multiple Vitamin (MULTIVITAMIN WITH MINERALS) TABS Take 1 tablet by mouth daily.     naproxen sodium (ALEVE) 220 MG tablet Take 440 mg by mouth daily as needed (pain).     Omega-3 Fatty Acids (FISH OIL) 1200 MG CAPS Take 1,200 mg by mouth daily.     simvastatin  (ZOCOR ) 40 MG tablet Take 1 tablet (40 mg total) by mouth daily. 90 tablet 2   warfarin (COUMADIN ) 2.5 MG tablet TAKE 1/2 TO 1 TABLET BY MOUTH DAILY AS DIRECTED BY COUMADIN  CLINIC 65 tablet 3   No current facility-administered medications for this visit.     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 where it was done, 4-5 yrs. ago   Hyperlipidemia 06/16/2012   Hypertension    Pacemaker    Medtronic; implanted 06/15/12   PAF (paroxysmal atrial fibrillation) (HCC)     ROS:   All systems reviewed and negative except as noted in the HPI.   Past Surgical History:  Procedure Laterality Date   COLONOSCOPY     INSERT / REPLACE / REMOVE PACEMAKER  06/15/2012    dual chamber   KNEE ARTHROPLASTY  04/19/2012   Procedure: COMPUTER ASSISTED TOTAL KNEE ARTHROPLASTY;  Surgeon: Norleen LITTIE Gavel, MD;  Location: [facility];  Service: Orthopedics;  Laterality: Right;  GENERAL WITH PRE OP FEMORAL NERVE BLOCK   PERMANENT PACEMAKER INSERTION N/A 06/15/2012   Procedure: PERMANENT PACEMAKER INSERTION;  Surgeon: Jerel Balding, MD;  Location: [facility];  Service: Cardiovascular;  Laterality: N/A;   PPM GENERATOR CHANGEOUT N/A 03/23/2021   Procedure: PPM GENERATOR CHANGEOUT;  Surgeon: Waddell Danelle ORN, MD;  Location: [facility];  Service: Cardiovascular;  Laterality: N/A;   TONSILLECTOMY     TOTAL HIP ARTHROPLASTY Right 05/21/2016   Procedure: TOTAL HIP ARTHROPLASTY ANTERIOR APPROACH;  Surgeon: Gavel Norleen, MD;  Location: [facility];  Service: Orthopedics;  Laterality: Right;   TOTAL KNEE ARTHROPLASTY  04/19/2012   RIGHT KNEE     Family History  Problem Relation Age of Onset   Heart disease Father    Colon cancer Neg Hx    Esophageal cancer Neg Hx    Rectal cancer Neg Hx    Stomach cancer Neg Hx      Social History   Socioeconomic History   Marital status: Married    Spouse  name: Not on file   Number of children: 2   Years of education: Not on file   Highest education level: Not on file  Occupational History   Occupation: retired  Tobacco Use   Smoking status: Never   Smokeless tobacco: Never  Vaping Use   Vaping status: Never Used  Substance and Sexual Activity   Alcohol  use: Yes    Comment: week- 1-2 drinks, 1-2 beers    Drug use: No   Sexual activity: Not on file  Other Topics Concern   Not on file  Social History Narrative   Not on file   Social Drivers of Health   Financial Resource Strain: Not on file  Food Insecurity: Not on file  Transportation Needs: Not on file  Physical Activity: Not on file  Stress: Not on file  Social Connections: Not on file  Intimate Partner Violence: Not on file     BP 106/74   Pulse 71   Ht 5' 10.5  (1.791 m)   Wt 189 lb 6.4 oz (85.9 kg)   SpO2 95%   BMI 26.79 kg/m   Physical Exam:  Well appearing NAD HEENT: Unremarkable Neck:  No JVD, no thyromegally Lymphatics:  No adenopathy Back:  No CVA tenderness Lungs:  Clear with no wheezes HEART:  Regular rate rhythm, no murmurs, no rubs, no clicks Abd:  soft, positive bowel sounds, no organomegally, no rebound, no guarding Ext:  2 plus pulses, no edema, no cyanosis, no clubbing Skin:  No rashes no nodules Neuro:  CN II through XII intact, motor grossly intact  EKG Nsr with atrial pacing DEVICE  Normal device function.  See PaceArt for details.   Assess/Plan:  Exertional dyspnea - this has improved markedly. I encouraged him to remain active. PAF - he denies symptoms.  Coags - he will continue systemic anti-coag HTN - his bp is controlled. No change in his meds.   Danelle Tihanna Goodson,MD

## 2024-01-27 NOTE — Patient Instructions (Addendum)
 Medication Instructions:  Your physician recommends that you continue on your current medications as directed. Please refer to the Current Medication list given to you today.  *If you need a refill on your cardiac medications before your next appointment, please call your pharmacy*  Lab Work: None ordered.  You may go to any [facility] Location for your lab work:  [facility] - [address] ([name]) - [address] [phone] [address]  [place] - [address]   [place]  - [address]   [place] - [address] - [address] [employer]  - [address] [place] - [address] ([facility]   If you have labs (blood work) drawn today and your tests are completely normal, you will receive your results only by: [employer] (if you have MyChart)  If you have any lab test that is abnormal or we need to change your treatment, we will call you or send a MyChart message to review the results.  Testing/Procedures: None ordered.  Follow-Up: At [facility], you and your health needs are our priority.  As part of our continuing mission to provide you with exceptional heart care, we have created designated Provider Care Teams.  These Care Teams include your primary Cardiologist (physician) and Advanced Practice Providers (APPs -  Physician Assistants and Nurse Practitioners) who all work together to provide you with the care you need, when you need it.  Your next appointment:   1 year(s)  The format for your next appointment:   In Person  Provider:   Donnice Primus, MD or one of the following Advanced Practice Providers on your designated Care Team:   Richard [name], [place] Richard [name], [place] Richard Barrack, NP  Note: Remote monitoring is used to monitor your Pacemaker/ ICD from home. This monitoring reduces the number of office visits required to check  your device to one time per year. It allows us  to keep an eye on the functioning of your device to ensure it is working properly.

## 2024-01-29 ENCOUNTER — Other Ambulatory Visit: Payer: Self-pay | Admitting: Internal Medicine

## 2024-01-29 DIAGNOSIS — I480 Paroxysmal atrial fibrillation: Secondary | ICD-10-CM

## 2024-02-13 NOTE — Progress Notes (Signed)
 Remote PPM Transmission

## 2024-03-09 ENCOUNTER — Ambulatory Visit

## 2024-03-19 ENCOUNTER — Ambulatory Visit: Attending: Internal Medicine

## 2024-03-19 DIAGNOSIS — Z7901 Long term (current) use of anticoagulants: Secondary | ICD-10-CM

## 2024-03-19 DIAGNOSIS — I480 Paroxysmal atrial fibrillation: Secondary | ICD-10-CM

## 2024-03-19 LAB — POCT INR: INR: 3.7 — AB (ref 2.0–3.0)

## 2024-03-19 NOTE — Progress Notes (Signed)
 INR 3.7 Please see anticoagulation encounter Hold today and tomorrow then CONTINUE taking warfarin 1/2 tablet daily except for 1 tablet on Fridays.  Recheck INR in 6 weeks. Coumadin  Clinic [phone].

## 2024-03-19 NOTE — Patient Instructions (Signed)
 Hold today and tomorrow then CONTINUE taking warfarin 1/2 tablet daily except for 1 tablet on Fridays.  Recheck INR in 6 weeks. Coumadin  Clinic [phone].

## 2024-03-20 ENCOUNTER — Ambulatory Visit (INDEPENDENT_AMBULATORY_CARE_PROVIDER_SITE_OTHER): Payer: Medicare Other

## 2024-03-20 DIAGNOSIS — I480 Paroxysmal atrial fibrillation: Secondary | ICD-10-CM | POA: Diagnosis not present

## 2024-03-20 LAB — CUP PACEART REMOTE DEVICE CHECK
Battery Remaining Longevity: 105 mo
Battery Voltage: 2.99 V
Brady Statistic AP VP Percent: 4.07 %
Brady Statistic AP VS Percent: 90.07 %
Brady Statistic AS VP Percent: 0.21 %
Brady Statistic AS VS Percent: 5.62 %
Brady Statistic RA Percent Paced: 76.32 %
Brady Statistic RV Percent Paced: 10.57 %
Date Time Interrogation Session: 20251103225222
Implantable Lead Connection Status: 753985
Implantable Lead Connection Status: 753985
Implantable Lead Implant Date: 20140130
Implantable Lead Implant Date: 20140130
Implantable Lead Location: 753859
Implantable Lead Location: 753860
Implantable Pulse Generator Implant Date: 20221107
Lead Channel Impedance Value: 475 Ohm
Lead Channel Impedance Value: 627 Ohm
Lead Channel Impedance Value: 912 Ohm
Lead Channel Impedance Value: 988 Ohm
Lead Channel Pacing Threshold Amplitude: 1 V
Lead Channel Pacing Threshold Amplitude: 1.75 V
Lead Channel Pacing Threshold Pulse Width: 0.4 ms
Lead Channel Pacing Threshold Pulse Width: 0.4 ms
Lead Channel Sensing Intrinsic Amplitude: 1.5 mV
Lead Channel Sensing Intrinsic Amplitude: 1.5 mV
Lead Channel Sensing Intrinsic Amplitude: 5.875 mV
Lead Channel Sensing Intrinsic Amplitude: 5.875 mV
Lead Channel Setting Pacing Amplitude: 2 V
Lead Channel Setting Pacing Amplitude: 2.5 V
Lead Channel Setting Pacing Pulse Width: 0.4 ms
Lead Channel Setting Sensing Sensitivity: 0.9 mV
Zone Setting Status: 755011
Zone Setting Status: 755011

## 2024-03-23 ENCOUNTER — Ambulatory Visit: Payer: Self-pay | Admitting: Internal Medicine

## 2024-03-26 NOTE — Progress Notes (Signed)
 Remote PPM Transmission

## 2024-04-30 ENCOUNTER — Ambulatory Visit: Attending: Internal Medicine

## 2024-04-30 DIAGNOSIS — Z7901 Long term (current) use of anticoagulants: Secondary | ICD-10-CM | POA: Diagnosis not present

## 2024-04-30 DIAGNOSIS — I480 Paroxysmal atrial fibrillation: Secondary | ICD-10-CM

## 2024-04-30 LAB — POCT INR: INR: 3.3 — AB (ref 2.0–3.0)

## 2024-04-30 NOTE — Patient Instructions (Signed)
 Hold today only then CONTINUE taking warfarin 1/2 tablet daily except for 1 tablet on Fridays.  Recheck INR in 6 weeks. Coumadin  Clinic [phone].

## 2024-04-30 NOTE — Progress Notes (Signed)
 INR 3.3 Please see anticoagulation encounter Hold today only then CONTINUE taking warfarin 1/2 tablet daily except for 1 tablet on Fridays.  Recheck INR in 6 weeks. Coumadin  Clinic [phone].

## 2024-06-11 ENCOUNTER — Ambulatory Visit

## 2024-06-15 ENCOUNTER — Ambulatory Visit

## 2024-06-19 ENCOUNTER — Ambulatory Visit

## 2024-06-19 ENCOUNTER — Ambulatory Visit: Payer: Medicare Other

## 2024-06-19 DIAGNOSIS — I82729 Chronic embolism and thrombosis of deep veins of unspecified upper extremity: Secondary | ICD-10-CM | POA: Diagnosis not present

## 2024-06-19 DIAGNOSIS — I480 Paroxysmal atrial fibrillation: Secondary | ICD-10-CM | POA: Diagnosis not present

## 2024-06-19 DIAGNOSIS — I4891 Unspecified atrial fibrillation: Secondary | ICD-10-CM

## 2024-06-19 DIAGNOSIS — Z7901 Long term (current) use of anticoagulants: Secondary | ICD-10-CM | POA: Diagnosis not present

## 2024-06-19 LAB — CUP PACEART REMOTE DEVICE CHECK
Battery Remaining Longevity: 102 mo
Battery Voltage: 2.99 V
Brady Statistic AP VP Percent: 3.95 %
Brady Statistic AP VS Percent: 91.49 %
Brady Statistic AS VP Percent: 0.15 %
Brady Statistic AS VS Percent: 4.36 %
Brady Statistic RA Percent Paced: 73.58 %
Brady Statistic RV Percent Paced: 11.81 %
Date Time Interrogation Session: 20260203015831
Implantable Lead Connection Status: 753985
Implantable Lead Connection Status: 753985
Implantable Lead Implant Date: 20140130
Implantable Lead Implant Date: 20140130
Implantable Lead Location: 753859
Implantable Lead Location: 753860
Implantable Pulse Generator Implant Date: 20221107
Lead Channel Impedance Value: 494 Ohm
Lead Channel Impedance Value: 646 Ohm
Lead Channel Impedance Value: 893 Ohm
Lead Channel Impedance Value: 969 Ohm
Lead Channel Pacing Threshold Amplitude: 0.75 V
Lead Channel Pacing Threshold Amplitude: 1.875 V
Lead Channel Pacing Threshold Pulse Width: 0.4 ms
Lead Channel Pacing Threshold Pulse Width: 0.4 ms
Lead Channel Sensing Intrinsic Amplitude: 5.25 mV
Lead Channel Sensing Intrinsic Amplitude: 5.25 mV
Lead Channel Sensing Intrinsic Amplitude: 5.625 mV
Lead Channel Sensing Intrinsic Amplitude: 5.625 mV
Lead Channel Setting Pacing Amplitude: 2 V
Lead Channel Setting Pacing Amplitude: 2.5 V
Lead Channel Setting Pacing Pulse Width: 0.4 ms
Lead Channel Setting Sensing Sensitivity: 0.9 mV
Zone Setting Status: 755011
Zone Setting Status: 755011

## 2024-06-19 LAB — POCT INR: INR: 2.9 (ref 2.0–3.0)

## 2024-06-19 NOTE — Patient Instructions (Signed)
 Description   INR 2.9, Continue taking warfarin 1/2 tablet daily except for 1 tablet on Fridays.  Recheck INR in 6 weeks. Coumadin  Clinic [phone].

## 2024-06-19 NOTE — Progress Notes (Signed)
 Description   INR 2.9, Continue taking warfarin 1/2 tablet daily except for 1 tablet on Fridays.  Recheck INR in 6 weeks. Coumadin  Clinic [phone].

## 2024-07-30 ENCOUNTER — Ambulatory Visit

## 2024-09-18 ENCOUNTER — Ambulatory Visit

## 2024-12-18 ENCOUNTER — Ambulatory Visit

## 2025-03-19 ENCOUNTER — Ambulatory Visit

## 2025-06-18 ENCOUNTER — Ambulatory Visit

## 2025-09-17 ENCOUNTER — Ambulatory Visit

## 2025-12-17 ENCOUNTER — Ambulatory Visit

## 2026-03-18 ENCOUNTER — Ambulatory Visit

## 2026-06-17 ENCOUNTER — Ambulatory Visit
